# Patient Record
Sex: Female | Born: 1940 | Race: White | Hispanic: No | Smoking: Former smoker
Health system: Southern US, Community
[De-identification: ages and names within clinical notes are randomized; demographics above are authoritative.]

## PROBLEM LIST (undated history)

## (undated) DIAGNOSIS — I10 Essential (primary) hypertension: Secondary | ICD-10-CM

## (undated) DIAGNOSIS — E785 Hyperlipidemia, unspecified: Secondary | ICD-10-CM

## (undated) DIAGNOSIS — Z860100 Personal history of colon polyps, unspecified: Secondary | ICD-10-CM

## (undated) DIAGNOSIS — N189 Chronic kidney disease, unspecified: Secondary | ICD-10-CM

## (undated) DIAGNOSIS — F32A Depression, unspecified: Secondary | ICD-10-CM

## (undated) DIAGNOSIS — F329 Major depressive disorder, single episode, unspecified: Secondary | ICD-10-CM

## (undated) DIAGNOSIS — Z8719 Personal history of other diseases of the digestive system: Secondary | ICD-10-CM

## (undated) DIAGNOSIS — Z87442 Personal history of urinary calculi: Secondary | ICD-10-CM

## (undated) DIAGNOSIS — M810 Age-related osteoporosis without current pathological fracture: Secondary | ICD-10-CM

## (undated) DIAGNOSIS — K219 Gastro-esophageal reflux disease without esophagitis: Secondary | ICD-10-CM

## (undated) DIAGNOSIS — I6529 Occlusion and stenosis of unspecified carotid artery: Secondary | ICD-10-CM

## (undated) DIAGNOSIS — G43909 Migraine, unspecified, not intractable, without status migrainosus: Secondary | ICD-10-CM

## (undated) DIAGNOSIS — Q249 Congenital malformation of heart, unspecified: Secondary | ICD-10-CM

## (undated) HISTORY — DX: Hyperlipidemia, unspecified: E78.5

## (undated) HISTORY — DX: Personal history of other diseases of the digestive system: Z87.19

## (undated) HISTORY — DX: Migraine, unspecified, not intractable, without status migrainosus: G43.909

## (undated) HISTORY — PX: COLONOSCOPY: SHX174

## (undated) HISTORY — DX: Essential (primary) hypertension: I10

## (undated) HISTORY — DX: Congenital malformation of heart, unspecified: Q24.9

## (undated) HISTORY — DX: Personal history of colon polyps, unspecified: Z86.0100

## (undated) HISTORY — DX: Gastro-esophageal reflux disease without esophagitis: K21.9

## (undated) HISTORY — PX: TUBAL LIGATION: SHX77

## (undated) HISTORY — DX: Personal history of urinary calculi: Z87.442

## (undated) HISTORY — PX: EYE SURGERY: SHX253

## (undated) HISTORY — DX: Age-related osteoporosis without current pathological fracture: M81.0

## (undated) HISTORY — DX: Personal history of colonic polyps: Z86.010

## (undated) HISTORY — DX: Occlusion and stenosis of unspecified carotid artery: I65.29

---

## 2011-03-10 DIAGNOSIS — H25019 Cortical age-related cataract, unspecified eye: Secondary | ICD-10-CM | POA: Diagnosis not present

## 2011-03-22 DIAGNOSIS — H40019 Open angle with borderline findings, low risk, unspecified eye: Secondary | ICD-10-CM | POA: Diagnosis not present

## 2011-07-19 ENCOUNTER — Ambulatory Visit: Payer: Self-pay

## 2011-07-19 DIAGNOSIS — S92919A Unspecified fracture of unspecified toe(s), initial encounter for closed fracture: Secondary | ICD-10-CM | POA: Diagnosis not present

## 2011-09-12 DIAGNOSIS — S52599A Other fractures of lower end of unspecified radius, initial encounter for closed fracture: Secondary | ICD-10-CM | POA: Diagnosis not present

## 2011-09-12 DIAGNOSIS — M25549 Pain in joints of unspecified hand: Secondary | ICD-10-CM | POA: Diagnosis not present

## 2011-09-12 DIAGNOSIS — I10 Essential (primary) hypertension: Secondary | ICD-10-CM | POA: Diagnosis not present

## 2011-09-12 DIAGNOSIS — IMO0002 Reserved for concepts with insufficient information to code with codable children: Secondary | ICD-10-CM | POA: Diagnosis not present

## 2011-09-12 DIAGNOSIS — S5290XA Unspecified fracture of unspecified forearm, initial encounter for closed fracture: Secondary | ICD-10-CM | POA: Diagnosis not present

## 2011-09-12 DIAGNOSIS — M25539 Pain in unspecified wrist: Secondary | ICD-10-CM | POA: Diagnosis not present

## 2011-09-13 DIAGNOSIS — S52599A Other fractures of lower end of unspecified radius, initial encounter for closed fracture: Secondary | ICD-10-CM | POA: Diagnosis not present

## 2011-09-13 DIAGNOSIS — M25539 Pain in unspecified wrist: Secondary | ICD-10-CM | POA: Diagnosis not present

## 2011-10-04 DIAGNOSIS — Z0000 Encounter for general adult medical examination without abnormal findings: Secondary | ICD-10-CM | POA: Diagnosis not present

## 2011-10-04 DIAGNOSIS — N951 Menopausal and female climacteric states: Secondary | ICD-10-CM | POA: Diagnosis not present

## 2011-10-04 DIAGNOSIS — M810 Age-related osteoporosis without current pathological fracture: Secondary | ICD-10-CM | POA: Diagnosis not present

## 2011-10-04 DIAGNOSIS — Z1231 Encounter for screening mammogram for malignant neoplasm of breast: Secondary | ICD-10-CM | POA: Diagnosis not present

## 2011-10-04 DIAGNOSIS — S52599A Other fractures of lower end of unspecified radius, initial encounter for closed fracture: Secondary | ICD-10-CM | POA: Diagnosis not present

## 2011-10-25 DIAGNOSIS — S52599A Other fractures of lower end of unspecified radius, initial encounter for closed fracture: Secondary | ICD-10-CM | POA: Diagnosis not present

## 2011-10-25 DIAGNOSIS — M25539 Pain in unspecified wrist: Secondary | ICD-10-CM | POA: Diagnosis not present

## 2011-11-01 DIAGNOSIS — E785 Hyperlipidemia, unspecified: Secondary | ICD-10-CM | POA: Diagnosis not present

## 2011-11-15 DIAGNOSIS — S52599A Other fractures of lower end of unspecified radius, initial encounter for closed fracture: Secondary | ICD-10-CM | POA: Diagnosis not present

## 2011-11-15 DIAGNOSIS — M25539 Pain in unspecified wrist: Secondary | ICD-10-CM | POA: Diagnosis not present

## 2011-11-17 DIAGNOSIS — Z780 Asymptomatic menopausal state: Secondary | ICD-10-CM | POA: Diagnosis not present

## 2011-11-17 DIAGNOSIS — R928 Other abnormal and inconclusive findings on diagnostic imaging of breast: Secondary | ICD-10-CM | POA: Diagnosis not present

## 2011-11-17 DIAGNOSIS — Z1231 Encounter for screening mammogram for malignant neoplasm of breast: Secondary | ICD-10-CM | POA: Diagnosis not present

## 2011-11-17 DIAGNOSIS — M899 Disorder of bone, unspecified: Secondary | ICD-10-CM | POA: Diagnosis not present

## 2012-01-08 DIAGNOSIS — Z23 Encounter for immunization: Secondary | ICD-10-CM | POA: Diagnosis not present

## 2012-01-19 HISTORY — PX: CATARACT EXTRACTION: SUR2

## 2012-02-28 DIAGNOSIS — H40019 Open angle with borderline findings, low risk, unspecified eye: Secondary | ICD-10-CM | POA: Diagnosis not present

## 2012-03-06 DIAGNOSIS — H2510 Age-related nuclear cataract, unspecified eye: Secondary | ICD-10-CM | POA: Diagnosis not present

## 2012-04-25 DIAGNOSIS — I10 Essential (primary) hypertension: Secondary | ICD-10-CM | POA: Diagnosis not present

## 2012-04-25 DIAGNOSIS — Z01818 Encounter for other preprocedural examination: Secondary | ICD-10-CM | POA: Diagnosis not present

## 2012-04-25 DIAGNOSIS — H269 Unspecified cataract: Secondary | ICD-10-CM | POA: Diagnosis not present

## 2012-05-09 DIAGNOSIS — I499 Cardiac arrhythmia, unspecified: Secondary | ICD-10-CM | POA: Diagnosis not present

## 2012-05-09 DIAGNOSIS — F329 Major depressive disorder, single episode, unspecified: Secondary | ICD-10-CM | POA: Diagnosis not present

## 2012-05-09 DIAGNOSIS — IMO0002 Reserved for concepts with insufficient information to code with codable children: Secondary | ICD-10-CM | POA: Diagnosis not present

## 2012-05-09 DIAGNOSIS — M810 Age-related osteoporosis without current pathological fracture: Secondary | ICD-10-CM | POA: Diagnosis not present

## 2012-05-09 DIAGNOSIS — H2510 Age-related nuclear cataract, unspecified eye: Secondary | ICD-10-CM | POA: Diagnosis not present

## 2012-05-09 DIAGNOSIS — I10 Essential (primary) hypertension: Secondary | ICD-10-CM | POA: Diagnosis not present

## 2012-05-10 DIAGNOSIS — Z961 Presence of intraocular lens: Secondary | ICD-10-CM | POA: Diagnosis not present

## 2012-05-15 DIAGNOSIS — Z961 Presence of intraocular lens: Secondary | ICD-10-CM | POA: Diagnosis not present

## 2012-09-03 DIAGNOSIS — X500XXA Overexertion from strenuous movement or load, initial encounter: Secondary | ICD-10-CM | POA: Diagnosis not present

## 2012-09-03 DIAGNOSIS — S8263XA Displaced fracture of lateral malleolus of unspecified fibula, initial encounter for closed fracture: Secondary | ICD-10-CM | POA: Diagnosis not present

## 2012-09-03 DIAGNOSIS — S82899A Other fracture of unspecified lower leg, initial encounter for closed fracture: Secondary | ICD-10-CM | POA: Diagnosis not present

## 2012-09-04 DIAGNOSIS — S93409A Sprain of unspecified ligament of unspecified ankle, initial encounter: Secondary | ICD-10-CM | POA: Diagnosis not present

## 2012-09-20 DIAGNOSIS — S93409A Sprain of unspecified ligament of unspecified ankle, initial encounter: Secondary | ICD-10-CM | POA: Diagnosis not present

## 2012-11-07 DIAGNOSIS — Z23 Encounter for immunization: Secondary | ICD-10-CM | POA: Diagnosis not present

## 2012-11-20 DIAGNOSIS — I10 Essential (primary) hypertension: Secondary | ICD-10-CM | POA: Diagnosis not present

## 2013-03-21 ENCOUNTER — Encounter (INDEPENDENT_AMBULATORY_CARE_PROVIDER_SITE_OTHER): Payer: Self-pay

## 2013-03-21 ENCOUNTER — Encounter: Payer: Self-pay | Admitting: Adult Health

## 2013-03-21 ENCOUNTER — Ambulatory Visit (INDEPENDENT_AMBULATORY_CARE_PROVIDER_SITE_OTHER): Payer: Medicare Other | Admitting: Adult Health

## 2013-03-21 VITALS — BP 138/66 | HR 60 | Temp 98.2°F | Resp 14 | Ht 60.5 in | Wt 160.0 lb

## 2013-03-21 DIAGNOSIS — Z79899 Other long term (current) drug therapy: Secondary | ICD-10-CM | POA: Diagnosis not present

## 2013-03-21 LAB — POTASSIUM: POTASSIUM: 4.3 meq/L (ref 3.5–5.1)

## 2013-03-21 MED ORDER — CITALOPRAM HYDROBROMIDE 20 MG PO TABS: 20.0000 mg | ORAL_TABLET | Freq: Every day | ORAL | Status: DC

## 2013-03-21 MED ORDER — TRIAMTERENE-HCTZ 37.5-25 MG PO TABS: 1.0000 | ORAL_TABLET | Freq: Every day | ORAL | Status: DC

## 2013-03-21 MED ORDER — GEMFIBROZIL 600 MG PO TABS: 600.0000 mg | ORAL_TABLET | Freq: Two times a day (BID) | ORAL | Status: DC

## 2013-03-21 MED ORDER — METOPROLOL SUCCINATE ER 25 MG PO TB24: 25.0000 mg | ORAL_TABLET | Freq: Every day | ORAL | Status: DC

## 2013-03-21 NOTE — Progress Notes (Signed)
Patient ID: Christie Stevenson, female   DOB: [birth year], 73 y.o.   MRN: [id number]    Subjective:    Patient ID: Christie Stevenson, female    DOB: 1940-07-01, 73 y.o.   MRN: [id number]  HPI  Pt is a pleasant 73 y/o female who presents to clinic to establish care. Recently moved to the area from [place]. She is feeling well overall. Will request records from previous PCP. Pt reports that she will need refills on her medications.    Past Medical History  Diagnosis Date  . History of diverticulitis   . GERD (gastroesophageal reflux disease)   . 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ease   . Hyperlipidemia   . Hypertension   . Migraine   . History of kidney stones   . History of colon polyps   . Osteoporosis      Past Surgical History  Procedure Laterality Date  . Cataract extraction Left 2014     Family History  Problem Relation Age of Onset  . Arthritis Mother   . Hypertension Mother   . Osteoporosis Mother   . Osteoporosis Sister   . Osteoporosis Brother   . Diabetes Daughter   . Diabetes Son   . Cancer Paternal Uncle     Prostate Cancer  . Cancer Sister     Breast Cancer   . Osteoporosis Sister   . Osteoporosis Brother   . Diabetes Daughter      History   Social History  . Marital Status: Single    Spouse Name: N/A    Number of Children: 5  . Years of Education: 12   Occupational History  . Accounting work     Retired   Social History Main Topics  . Smoking status: Former Research scientist (life sciences)  . Smokeless tobacco: Not on file  . Alcohol Use: Yes     Comment: occasional drink  . Drug Use: No  . Sexual Activity: Not on file   Other Topics Concern  . Not on file   Social History Narrative   Caffeine - 2 cups of coffee   Exercise - none regularly   Diet - tries to eat as healthy as possible but can do better      Does not have a Living Will           Review of Systems  Constitutional: Negative.   HENT: Negative.   Eyes: Negative.   Respiratory:  Negative.   Cardiovascular: Negative.   Gastrointestinal: Negative.   Endocrine: Negative.   Genitourinary: Negative.   Musculoskeletal: Negative.   Skin: Negative.   Allergic/Immunologic: Negative.   Neurological: Negative.   Hematological: Negative.   Psychiatric/Behavioral: Negative.        Objective:  BP 138/66  Pulse 60  Temp(Src) 98.2 F (36.8 C) (Oral)  Ht 5' 0.5" (1.537 m)  Wt 160 lb (72.576 kg)  BMI 30.72 kg/m2  SpO2 94%   Physical Exam  Constitutional: She is oriented to person, place, and time. She appears well-developed and well-nourished. No distress.  HENT:  Head: Normocephalic and atraumatic.  Right Ear: External ear normal.  Left Ear: External ear normal.  Nose: Nose normal.  Mouth/Throat: Oropharynx is clear and moist.  Eyes: Conjunctivae and EOM are normal. Pupils are equal, round, and reactive to light.  Neck: Normal range of motion. Neck supple. No tracheal deviation present. No thyromegaly present.  Cardiovascular: Normal rate, regular rhythm, normal heart sounds and intact distal pulses.  Exam reveals no gallop and no  friction rub.   No murmur heard. Pulmonary/Chest: Effort normal and breath sounds normal. No respiratory distress. She has no wheezes. She has no rales.  Abdominal: Soft. Bowel sounds are normal. She exhibits no distension and no mass. There is no tenderness. There is no rebound and no guarding.  Musculoskeletal: Normal range of motion. She exhibits no edema and no tenderness.  Lymphadenopathy:    She has no cervical adenopathy.  Neurological: She is alert and oriented to person, place, and time. She has normal reflexes. No cranial nerve deficit. Coordination normal.  Skin: Skin is warm and dry.  Psychiatric: She has a normal mood and affect. Her behavior is normal. Judgment and thought content normal.       Assessment & Plan:   1. Medication management Medications reviewed with patient. I will check her potassium given meds that  are potassium sparing and she takes a multi-vitamin containing potassium. Refills provided  - Potassium

## 2013-03-21 NOTE — Progress Notes (Signed)
Pre visit review using our clinic review tool, if applicable. No additional management support is needed unless otherwise documented below in the visit note. 

## 2013-03-21 NOTE — Patient Instructions (Signed)
   Thank you for choosing [facility] for your health care needs.  Please have your labs drawn prior to leaving the office. This is to check your potassium.  I will notify you of the results once they are available.  Please remember to schedule your yearly Medicare Wellness exam.

## 2013-03-22 DIAGNOSIS — H2510 Age-related nuclear cataract, unspecified eye: Secondary | ICD-10-CM | POA: Diagnosis not present

## 2013-03-23 ENCOUNTER — Encounter: Payer: Self-pay | Admitting: *Deleted

## 2013-04-05 DIAGNOSIS — H40059 Ocular hypertension, unspecified eye: Secondary | ICD-10-CM | POA: Diagnosis not present

## 2013-04-19 ENCOUNTER — Other Ambulatory Visit: Payer: Self-pay | Admitting: *Deleted

## 2013-10-19 DIAGNOSIS — Z23 Encounter for immunization: Secondary | ICD-10-CM | POA: Diagnosis not present

## 2013-10-22 DIAGNOSIS — H40051 Ocular hypertension, right eye: Secondary | ICD-10-CM | POA: Diagnosis not present

## 2013-10-30 ENCOUNTER — Telehealth: Payer: Self-pay | Admitting: Internal Medicine

## 2013-10-30 NOTE — Telephone Encounter (Signed)
Pt had [facility] Screening done. She was told that she had to be seen by a doctor to review the results. Pt dropped off health screening forms to be reviewed.msn

## 2013-10-31 NOTE — Telephone Encounter (Signed)
Pt has an appt scheduled for Friday oct 16th, will give Dr Gilford Rile form to review with pt at that time

## 2013-11-02 ENCOUNTER — Ambulatory Visit (INDEPENDENT_AMBULATORY_CARE_PROVIDER_SITE_OTHER): Payer: Medicare Other | Admitting: Internal Medicine

## 2013-11-02 ENCOUNTER — Encounter: Payer: Self-pay | Admitting: Internal Medicine

## 2013-11-02 VITALS — BP 136/72 | HR 58 | Resp 14 | Ht 60.5 in | Wt 157.5 lb

## 2013-11-02 DIAGNOSIS — I6529 Occlusion and stenosis of unspecified carotid artery: Secondary | ICD-10-CM | POA: Insufficient documentation

## 2013-11-02 DIAGNOSIS — Z1239 Encounter for other screening for malignant neoplasm of breast: Secondary | ICD-10-CM

## 2013-11-02 DIAGNOSIS — I6521 Occlusion and stenosis of right carotid artery: Secondary | ICD-10-CM

## 2013-11-02 DIAGNOSIS — I10 Essential (primary) hypertension: Secondary | ICD-10-CM | POA: Diagnosis not present

## 2013-11-02 DIAGNOSIS — R5383 Other fatigue: Secondary | ICD-10-CM

## 2013-11-02 LAB — LIPID PANEL
Cholesterol: 261 mg/dL — ABNORMAL HIGH (ref 0–200)
HDL: 42.2 mg/dL (ref >39.00)
LDL Cholesterol: 186 mg/dL — ABNORMAL HIGH (ref 0–99)
NONHDL: 218.8
Total CHOL/HDL Ratio: 6
Triglycerides: 164 mg/dL — ABNORMAL HIGH (ref 0.0–149.0)
VLDL: 32.8 mg/dL (ref 0.0–40.0)

## 2013-11-02 LAB — CBC WITH DIFFERENTIAL/PLATELET
BASOS ABS: 0 10*3/uL (ref 0.0–0.1)
Basophils Relative: 0.5 % (ref 0.0–3.0)
EOS ABS: 0.2 10*3/uL (ref 0.0–0.7)
Eosinophils Relative: 2.7 % (ref 0.0–5.0)
HCT: 42.1 % (ref 36.0–46.0)
Hemoglobin: 13.7 g/dL (ref 12.0–15.0)
LYMPHS PCT: 20.9 % (ref 12.0–46.0)
Lymphs Abs: 1.8 10*3/uL (ref 0.7–4.0)
MCHC: 32.6 g/dL (ref 30.0–36.0)
MCV: 85.6 fl (ref 78.0–100.0)
MONO ABS: 0.7 10*3/uL (ref 0.1–1.0)
Monocytes Relative: 8 % (ref 3.0–12.0)
NEUTROS PCT: 67.9 % (ref 43.0–77.0)
Neutro Abs: 5.8 10*3/uL (ref 1.4–7.7)
Platelets: 313 10*3/uL (ref 150.0–400.0)
RBC: 4.92 Mil/uL (ref 3.87–5.11)
RDW: 14.2 % (ref 11.5–15.5)
WBC: 8.6 10*3/uL (ref 4.0–10.5)

## 2013-11-02 LAB — COMPREHENSIVE METABOLIC PANEL
ALBUMIN: 3.5 g/dL (ref 3.5–5.2)
ALT: 15 U/L (ref 0–35)
AST: 19 U/L (ref 0–37)
Alkaline Phosphatase: 90 U/L (ref 39–117)
BUN: 18 mg/dL (ref 6–23)
CO2: 29 mEq/L (ref 19–32)
Calcium: 10.3 mg/dL (ref 8.4–10.5)
Chloride: 96 mEq/L (ref 96–112)
Creatinine, Ser: 1.1 mg/dL (ref 0.4–1.2)
GFR: 54.12 mL/min — ABNORMAL LOW (ref >60.00)
Glucose, Bld: 81 mg/dL (ref 70–99)
POTASSIUM: 4.1 meq/L (ref 3.5–5.1)
Sodium: 133 mEq/L — ABNORMAL LOW (ref 135–145)
Total Bilirubin: 0.8 mg/dL (ref 0.2–1.2)
Total Protein: 7.5 g/dL (ref 6.0–8.3)

## 2013-11-02 LAB — TSH: TSH: 1.69 u[IU]/mL (ref 0.35–4.50)

## 2013-11-02 LAB — VITAMIN D 25 HYDROXY (VIT D DEFICIENCY, FRACTURES): VITD: 25.97 ng/mL — AB (ref 30.00–100.00)

## 2013-11-02 LAB — VITAMIN B12: VITAMIN B 12: 636 pg/mL (ref 211–911)

## 2013-11-02 NOTE — Patient Instructions (Signed)
Labs today.  We will set up a carotid ultrasound.  Follow up here in 4 weeks.

## 2013-11-02 NOTE — Assessment & Plan Note (Signed)
BP Readings from Last 3 Encounters:  11/02/13 136/72  03/21/13 138/66   BP well controlled on current medication. Will check renal function with labs.

## 2013-11-02 NOTE — Assessment & Plan Note (Signed)
Several months of increasing generalized fatigue. Will check CBC< CMP, lipids, TSH, B12, Vit D with labs.

## 2013-11-02 NOTE — Assessment & Plan Note (Signed)
Recent [facility] screening showed stenosis in right carotid artery. Will repeat full carotid dopplers. Check lipids with labs today.

## 2013-11-02 NOTE — Progress Notes (Signed)
Subjective:    Patient ID: Christie Stevenson, female    DOB: [birth year], 73 y.o.   MRN: [id number]  HPI 73YO female presents to follow up recent [facility] Screening. Patient of Raquel Rey's.  [facility] Screen suggested carotid artery disease on the right. Currently asymptomatic except for mild fatigue. Occasional lightheadedness when lying down for sleep. Former smoker, quit in 1997. Had previous cardiac work up which was [place] No chest pain, dyspnea, focal neurologic symptoms. No change in bowel habits. Appetite good.    Review of Systems  Constitutional: Positive for fatigue. Negative for fever, chills, appetite change and unexpected weight change.  Eyes: Negative for visual disturbance.  Respiratory: Negative for shortness of breath.   Cardiovascular: Negative for chest pain, palpitations and leg swelling.  Gastrointestinal: Negative for nausea, vomiting, abdominal pain, diarrhea, constipation and blood in stool.  Skin: Negative for color change and rash.  Hematological: Negative for adenopathy. Does not bruise/bleed easily.  Psychiatric/Behavioral: Negative for sleep disturbance and dysphoric mood. The patient is not nervous/anxious.        Objective:    BP 136/72  Pulse 58  Resp 14  Ht 5' 0.5" (1.537 m)  Wt 157 lb 8 oz (71.442 kg)  BMI 30.24 kg/m2  SpO2 97% Physical Exam  Constitutional: She is oriented to person, place, and time. She appears well-developed and well-nourished. No distress.  HENT:  Head: Normocephalic and atraumatic.  Right Ear: External ear normal.  Left Ear: External ear normal.  Nose: Nose normal.  Mouth/Throat: Oropharynx is clear and moist. No oropharyngeal exudate.  Eyes: Conjunctivae are normal. Pupils are equal, round, and reactive to light. Right eye exhibits no discharge. Left eye exhibits no discharge. No scleral icterus.  Neck: Normal range of motion. Neck supple. Normal carotid pulses present. Carotid  bruit is not present. No tracheal deviation present. No mass and no thyromegaly present.  Cardiovascular: Normal rate, regular rhythm, normal heart sounds and intact distal pulses.  Exam reveals no gallop and no friction rub.   No murmur heard. Pulmonary/Chest: Effort normal and breath sounds normal. No accessory muscle usage or stridor. Not tachypneic. No respiratory distress. She has no decreased breath sounds. She has no wheezes. She has no rhonchi. She has no rales. She exhibits no tenderness.  Musculoskeletal: Normal range of motion. She exhibits no edema and no tenderness.  Lymphadenopathy:    She has no cervical adenopathy.  Neurological: She is alert and oriented to person, place, and time. No cranial nerve deficit. She exhibits normal muscle tone. Coordination normal.  Skin: Skin is warm and dry. No rash noted. She is not diaphoretic. No erythema. No pallor.  Psychiatric: She has a normal mood and affect. Her behavior is normal. Judgment and thought content normal.          Assessment & Plan:   Problem List Items Addressed This Visit     Unprioritized   Carotid stenosis - Primary     Recent [facility] screening showed stenosis in right carotid artery. Will repeat full carotid dopplers. Check lipids with labs today.    Relevant Orders      Lipid Profile      Ambulatory referral to Vascular Surgery   Essential hypertension      BP Readings from Last 3 Encounters:  11/02/13 136/72  03/21/13 138/66   BP well controlled on current medication. Will check renal function with labs.    Relevant Orders      EKG 12-Lead  Other fatigue     Several months of increasing generalized fatigue. Will check CBC< CMP, lipids, TSH, B12, Vit D with labs.    Relevant Orders      Comprehensive metabolic panel      CBC w/Diff      TSH      B12      Vit D  25 hydroxy (rtn osteoporosis monitoring)   Screening for breast cancer   Relevant Orders      MM Digital Screening       Return  in about 4 weeks (around 11/30/2013) for Recheck.

## 2013-11-02 NOTE — Progress Notes (Signed)
Pre visit review using our clinic review tool, if applicable. No additional management support is needed unless otherwise documented below in the visit note. 

## 2013-11-05 ENCOUNTER — Telehealth: Payer: Self-pay | Admitting: Adult Health

## 2013-11-05 ENCOUNTER — Other Ambulatory Visit: Payer: Self-pay | Admitting: *Deleted

## 2013-11-05 MED ORDER — ATORVASTATIN CALCIUM 20 MG PO TABS: 20.0000 mg | ORAL_TABLET | Freq: Every day | ORAL | Status: DC

## 2013-11-05 NOTE — Telephone Encounter (Signed)
emmi emailed °

## 2013-11-16 ENCOUNTER — Other Ambulatory Visit: Payer: Self-pay | Admitting: *Deleted

## 2013-11-16 MED ORDER — TRIAMTERENE-HCTZ 37.5-25 MG PO TABS: 1.0000 | ORAL_TABLET | Freq: Every day | ORAL | Status: DC

## 2013-11-16 MED ORDER — CITALOPRAM HYDROBROMIDE 20 MG PO TABS: 20.0000 mg | ORAL_TABLET | Freq: Every day | ORAL | Status: DC

## 2013-11-26 DIAGNOSIS — I499 Cardiac arrhythmia, unspecified: Secondary | ICD-10-CM | POA: Diagnosis not present

## 2013-11-26 DIAGNOSIS — I8390 Asymptomatic varicose veins of unspecified lower extremity: Secondary | ICD-10-CM | POA: Diagnosis not present

## 2013-11-26 DIAGNOSIS — E785 Hyperlipidemia, unspecified: Secondary | ICD-10-CM | POA: Diagnosis not present

## 2013-11-26 DIAGNOSIS — I6529 Occlusion and stenosis of unspecified carotid artery: Secondary | ICD-10-CM | POA: Diagnosis not present

## 2013-12-04 ENCOUNTER — Ambulatory Visit (INDEPENDENT_AMBULATORY_CARE_PROVIDER_SITE_OTHER): Payer: Medicare Other | Admitting: Internal Medicine

## 2013-12-04 ENCOUNTER — Encounter: Payer: Self-pay | Admitting: Internal Medicine

## 2013-12-04 VITALS — BP 118/71 | HR 58 | Temp 97.8°F | Ht 60.5 in | Wt 154.8 lb

## 2013-12-04 DIAGNOSIS — I6521 Occlusion and stenosis of right carotid artery: Secondary | ICD-10-CM | POA: Diagnosis not present

## 2013-12-04 DIAGNOSIS — E785 Hyperlipidemia, unspecified: Secondary | ICD-10-CM | POA: Diagnosis not present

## 2013-12-04 LAB — LIPID PANEL
CHOL/HDL RATIO: 4
Cholesterol: 177 mg/dL (ref 0–200)
HDL: 49.3 mg/dL (ref >39.00)
LDL Cholesterol: 110 mg/dL — ABNORMAL HIGH (ref 0–99)
NONHDL: 127.7
Triglycerides: 89 mg/dL (ref 0.0–149.0)
VLDL: 17.8 mg/dL (ref 0.0–40.0)

## 2013-12-04 LAB — COMPREHENSIVE METABOLIC PANEL
ALT: 17 U/L (ref 0–35)
AST: 22 U/L (ref 0–37)
Albumin: 4 g/dL (ref 3.5–5.2)
Alkaline Phosphatase: 79 U/L (ref 39–117)
BUN: 23 mg/dL (ref 6–23)
CHLORIDE: 101 meq/L (ref 96–112)
CO2: 27 mEq/L (ref 19–32)
Calcium: 10.1 mg/dL (ref 8.4–10.5)
Creatinine, Ser: 1.1 mg/dL (ref 0.4–1.2)
GFR: 49.75 mL/min — ABNORMAL LOW (ref >60.00)
Glucose, Bld: 81 mg/dL (ref 70–99)
POTASSIUM: 4.1 meq/L (ref 3.5–5.1)
Sodium: 134 mEq/L — ABNORMAL LOW (ref 135–145)
TOTAL PROTEIN: 7.1 g/dL (ref 6.0–8.3)
Total Bilirubin: 0.6 mg/dL (ref 0.2–1.2)

## 2013-12-04 NOTE — Patient Instructions (Signed)
Labs today.  Follow up in 4 weeks. 

## 2013-12-04 NOTE — Progress Notes (Signed)
Pre visit review using our clinic review tool, if applicable. No additional management support is needed unless otherwise documented below in the visit note. 

## 2013-12-04 NOTE — Assessment & Plan Note (Signed)
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 Pt was started on Atorvastatin 20mg  daily. Will repeat CMP and lipids today.

## 2013-12-04 NOTE — Progress Notes (Signed)
   Subjective:    Patient ID: Christie Stevenson, female    DOB: 1941/01/02, 73 y.o.   MRN: [id number]  HPI 73YO female presents for follow up.  Hyperlipidemia - Noted to have LDL 186 on recent labs. Started on Atorvastatin. Trying to follow healthy diet and exercise by gardening. No side effects noted from medication.  Carotid stenosis - Scheduled for carotid dopplers in December.    Review of Systems  Constitutional: Negative for fever, chills, appetite change, fatigue and unexpected weight change.  Eyes: Negative for visual disturbance.  Respiratory: Negative for shortness of breath.   Cardiovascular: Negative for chest pain and leg swelling.  Gastrointestinal: Negative for abdominal pain.  Musculoskeletal: Negative for myalgias and arthralgias.  Skin: Negative for color change and rash.  Hematological: Negative for adenopathy. Does not bruise/bleed easily.  Psychiatric/Behavioral: Negative for dysphoric mood. The patient is not nervous/anxious.        Objective:    BP 118/71 mmHg  Pulse 58  Temp(Src) 97.8 F (36.6 C) (Oral)  Ht 5' 0.5" (1.537 m)  Wt 154 lb 12 oz (70.194 kg)  BMI 29.71 kg/m2  SpO2 96% Physical Exam  Constitutional: She is oriented to person, place, and time. She appears well-developed and well-nourished. No distress.  HENT:  Head: Normocephalic and atraumatic.  Right Ear: External ear normal.  Left Ear: External ear normal.  Nose: Nose normal.  Mouth/Throat: Oropharynx is clear and moist. No oropharyngeal exudate.  Eyes: Conjunctivae are normal. Pupils are equal, round, and reactive to light. Right eye exhibits no discharge. Left eye exhibits no discharge. No scleral icterus.  Neck: Normal range of motion. Neck supple. No tracheal deviation present. No thyromegaly present.  Cardiovascular: Normal rate, regular rhythm, normal heart sounds and intact distal pulses.  Exam reveals no gallop and no friction rub.   No murmur heard. Pulmonary/Chest: Effort normal  and breath sounds normal. No accessory muscle usage. No tachypnea. No respiratory distress. She has no decreased breath sounds. She has no wheezes. She has no rhonchi. She has no rales. She exhibits no tenderness.  Musculoskeletal: Normal range of motion. She exhibits no edema or tenderness.  Lymphadenopathy:    She has no cervical adenopathy.  Neurological: She is alert and oriented to person, place, and time. No cranial nerve deficit. She exhibits normal muscle tone. Coordination normal.  Skin: Skin is warm and dry. No rash noted. She is not diaphoretic. No erythema. No pallor.  Psychiatric: She has a normal mood and affect. Her behavior is normal. Judgment and thought content normal.          Assessment & Plan:   Problem List Items Addressed This Visit      Unprioritized   Carotid stenosis    Follow up carotid doppler is pending.    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 Pt was started on Atorvastatin 20mg  daily. Will repeat CMP and lipids today.    Relevant Orders      Comprehensive metabolic panel      Lipid Profile       Return in about 4 weeks (around 01/01/2014) for Recheck.

## 2013-12-04 NOTE — Assessment & Plan Note (Signed)
Follow up carotid doppler is pending.

## 2013-12-19 DIAGNOSIS — I6529 Occlusion and stenosis of unspecified carotid artery: Secondary | ICD-10-CM | POA: Diagnosis not present

## 2013-12-25 ENCOUNTER — Other Ambulatory Visit: Payer: Self-pay | Admitting: *Deleted

## 2013-12-25 MED ORDER — METOPROLOL SUCCINATE ER 25 MG PO TB24: 25.0000 mg | ORAL_TABLET | Freq: Every day | ORAL | Status: DC

## 2013-12-27 DIAGNOSIS — I6529 Occlusion and stenosis of unspecified carotid artery: Secondary | ICD-10-CM | POA: Diagnosis not present

## 2013-12-27 DIAGNOSIS — E785 Hyperlipidemia, unspecified: Secondary | ICD-10-CM | POA: Diagnosis not present

## 2013-12-27 DIAGNOSIS — I10 Essential (primary) hypertension: Secondary | ICD-10-CM | POA: Diagnosis not present

## 2014-01-01 ENCOUNTER — Ambulatory Visit: Payer: PRIVATE HEALTH INSURANCE | Admitting: Nurse Practitioner

## 2014-01-03 ENCOUNTER — Encounter: Payer: Self-pay | Admitting: Nurse Practitioner

## 2014-01-03 ENCOUNTER — Ambulatory Visit (INDEPENDENT_AMBULATORY_CARE_PROVIDER_SITE_OTHER): Payer: Medicare Other | Admitting: Nurse Practitioner

## 2014-01-03 VITALS — BP 120/68 | HR 78 | Temp 97.9°F | Resp 16 | Ht 60.5 in | Wt 158.2 lb

## 2014-01-03 DIAGNOSIS — N183 Chronic kidney disease, stage 3 unspecified: Secondary | ICD-10-CM

## 2014-01-03 DIAGNOSIS — E785 Hyperlipidemia, unspecified: Secondary | ICD-10-CM

## 2014-01-03 DIAGNOSIS — I10 Essential (primary) hypertension: Secondary | ICD-10-CM

## 2014-01-03 DIAGNOSIS — I6521 Occlusion and stenosis of right carotid artery: Secondary | ICD-10-CM

## 2014-01-03 DIAGNOSIS — E871 Hypo-osmolality and hyponatremia: Secondary | ICD-10-CM

## 2014-01-03 NOTE — Progress Notes (Signed)
Subjective:    Patient ID: Christie Stevenson, female    DOB: 24-Sep-1940, 73 y.o.   MRN: [id number]  HPI  [name] is a 73 yo female with here for a 4 week follow up.   1) Last lipid panel was 12/04/13 results below on 20 mg of Atorvastatin.   Lab Results  Component Value Date   CHOL 177 12/04/2013   HDL 49.30 12/04/2013   LDLCALC 110* 12/04/2013   TRIG 89.0 12/04/2013   CHOLHDL 4 12/04/2013   Fine so far, no issues with medication, compliance is excellent.  Carotid Dopplars done beginning of Dec.   Blockage on Left 50% , Right minimal   Repeat US in 6 months We discussed prior labs including CMET that showed worsening kidney function and low sodium. Pt states she does not drink much fluids, maybe a pepsi at lunch.   Patient has no other complaints today nor need for med refills .  Review of Systems  Constitutional: Negative for fever, chills, diaphoresis, fatigue and unexpected weight change.  HENT: Negative for trouble swallowing.   Eyes: Negative for visual disturbance.  Respiratory: Negative for chest tightness, shortness of breath and wheezing.   Cardiovascular: Negative for chest pain, palpitations and leg swelling.  Gastrointestinal: Negative for nausea, vomiting, abdominal pain, diarrhea and constipation.  Genitourinary: Negative for dysuria.  Musculoskeletal: Negative for myalgias.  Skin: Negative for rash.  Neurological: Negative for dizziness, weakness and headaches.  Psychiatric/Behavioral: The patient is not nervous/anxious.    Past Medical History  Diagnosis Date  . History of diverticulitis   . GERD (gastroesophageal reflux disease)   . 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ease   . Hyperlipidemia   . Hypertension   . Migraine   . History of kidney stones   . History of colon polyps   . Osteoporosis     History   Social History  . Marital Status: Single    Spouse Name: N/A    Number of Children: 5  . Years of Education: 12    Occupational History  . Accounting work     Retired   Social History Main Topics  . Smoking status: Former Research scientist (life sciences)  . Smokeless tobacco: Not on file  . Alcohol Use: Yes     Comment: occasional drink  . Drug Use: No  . Sexual Activity: Not on file   Other Topics Concern  . Not on file   Social History Narrative   Caffeine - 2 cups of coffee   Exercise - none regularly   Diet - tries to eat as healthy as possible but can do better      Does not have a Living Will          Past Surgical History  Procedure Laterality Date  . Cataract extraction Left 2014    Family History  Problem Relation Age of Onset  . Arthritis Mother   . Hypertension Mother   . Osteoporosis Mother   . Osteoporosis Sister   . Osteoporosis Brother   . Diabetes Daughter   . Diabetes Son   . Cancer Paternal Uncle     Prostate Cancer  . Cancer Sister     Breast Cancer   . Osteoporosis Sister   . Osteoporosis Brother   . Diabetes Daughter     No Known Allergies  Current Outpatient Prescriptions on File Prior to Visit  Medication Sig Dispense Refill  . amLODipine (NORVASC) 5 MG tablet Take 5 mg by mouth daily.    Marland Kitchen  atorvastatin (LIPITOR) 20 MG tablet Take 1 tablet (20 mg total) by mouth daily. 30 tablet 3  . Calcium Carb-Cholecalciferol (CALCIUM 600 + D PO) Take 600 mg by mouth 2 (two) times daily.    . citalopram (CELEXA) 20 MG tablet Take 1 tablet (20 mg total) by mouth daily. 30 tablet 2  . gemfibrozil (LOPID) 600 MG tablet Take 1 tablet (600 mg total) by mouth 2 (two) times daily before a meal. 60 tablet 6  . latanoprost (XALATAN) 0.005 % ophthalmic solution Place 1 drop into the right eye at bedtime.    . metoprolol succinate (TOPROL-XL) 25 MG 24 hr tablet Take 1 tablet (25 mg total) by mouth daily. 30 tablet 6  . Multiple Vitamin (MULTIVITAMIN WITH MINERALS) TABS tablet Take 1 tablet by mouth daily.    Marland Kitchen triamterene-hydrochlorothiazide (MAXZIDE-25) 37.5-25 MG per tablet Take 1 tablet by  mouth daily. 30 tablet 5   No current facility-administered medications on file prior to visit.       Objective:   Physical Exam  Constitutional: She is oriented to person, place, and time. She appears well-developed and well-nourished. No distress.  HENT:  Head: Normocephalic and atraumatic.  Cardiovascular: Normal rate and regular rhythm.   Pulmonary/Chest: Effort normal and breath sounds normal.  Musculoskeletal: Normal range of motion.  Neurological: She is alert and oriented to person, place, and time.  Skin: Skin is warm and dry. No rash noted. She is not diaphoretic.  Psychiatric: She has a normal mood and affect. Her behavior is normal. Judgment and thought content normal.      BP 120/68 mmHg  Pulse 78  Temp(Src) 97.9 F (36.6 C) (Oral)  Resp 16  Ht 5' 0.5" (1.537 m)  Wt 158 lb 3.2 oz (71.759 kg)  BMI 30.38 kg/m2  SpO2 98%     Assessment & Plan:

## 2014-01-03 NOTE — Patient Instructions (Addendum)
Please stay hydrated with water and non-caffienated beverages.   Please make a fasting lab visit before leaving today.

## 2014-01-03 NOTE — Assessment & Plan Note (Signed)
BP Readings from Last 3 Encounters:  01/03/14 120/68  12/04/13 118/71  11/02/13 136/72   BP well controlled on meds. Renal function on CMET is declining and pt has hyponatremia without symptoms (last two visits). Fasting labs to recheck a CMET. FU in 1 month

## 2014-01-03 NOTE — Progress Notes (Signed)
Pre visit review using our clinic review tool, if applicable. No additional management support is needed unless otherwise documented below in the visit note. 

## 2014-01-03 NOTE — Assessment & Plan Note (Signed)
Discussed drinking more fluids (non-caffeinated) through out the day. Will recheck lipids in Feb. FU in 1 month

## 2014-02-07 ENCOUNTER — Ambulatory Visit (INDEPENDENT_AMBULATORY_CARE_PROVIDER_SITE_OTHER): Payer: Medicare Other | Admitting: Nurse Practitioner

## 2014-02-07 ENCOUNTER — Encounter: Payer: Self-pay | Admitting: Nurse Practitioner

## 2014-02-07 VITALS — BP 130/70 | HR 63 | Resp 14 | Ht 60.5 in | Wt 159.5 lb

## 2014-02-07 DIAGNOSIS — E871 Hypo-osmolality and hyponatremia: Secondary | ICD-10-CM | POA: Diagnosis not present

## 2014-02-07 DIAGNOSIS — I10 Essential (primary) hypertension: Secondary | ICD-10-CM

## 2014-02-07 DIAGNOSIS — N183 Chronic kidney disease, stage 3 unspecified: Secondary | ICD-10-CM

## 2014-02-07 LAB — COMPREHENSIVE METABOLIC PANEL
ALT: 14 U/L (ref 0–35)
AST: 16 U/L (ref 0–37)
Albumin: 3.8 g/dL (ref 3.5–5.2)
Alkaline Phosphatase: 95 U/L (ref 39–117)
BUN: 24 mg/dL — ABNORMAL HIGH (ref 6–23)
CALCIUM: 10.2 mg/dL (ref 8.4–10.5)
CO2: 27 mEq/L (ref 19–32)
Chloride: 101 mEq/L (ref 96–112)
Creatinine, Ser: 1.12 mg/dL (ref 0.40–1.20)
GFR: 50.75 mL/min — ABNORMAL LOW (ref >60.00)
GLUCOSE: 84 mg/dL (ref 70–99)
Potassium: 3.9 mEq/L (ref 3.5–5.1)
Sodium: 136 mEq/L (ref 135–145)
TOTAL PROTEIN: 6.6 g/dL (ref 6.0–8.3)
Total Bilirubin: 0.6 mg/dL (ref 0.2–1.2)

## 2014-02-07 NOTE — Progress Notes (Signed)
   Subjective:    Patient ID: Christie Stevenson, female    DOB: 10-Oct-1940, 74 y.o.   MRN: [id number]  HPI  [name] is a 74 yo female with a CC of following up on HTN.   1)  Stable on Medications Amlodipine and Maxzide. No concerns or refills needed today. Pt to re-check CMET for history of hyponatremia and decreased GFR.                 Review of Systems  Constitutional: Negative for fever, chills, diaphoresis and fatigue.  Eyes: Negative for visual disturbance.  Respiratory: Negative for chest tightness, shortness of breath and wheezing.   Cardiovascular: Negative for chest pain, palpitations and leg swelling.  Gastrointestinal: Negative for nausea, vomiting, abdominal pain and diarrhea.  Skin: Negative for rash.      Objective:   Physical Exam  Constitutional: She is oriented to person, place, and time. She appears well-developed and well-nourished. No distress.  BP 130/70 mmHg  Pulse 63  Resp 14  Ht 5' 0.5" (1.537 m)  Wt 159 lb 8 oz (72.349 kg)  BMI 30.63 kg/m2  SpO2 98%   HENT:  Head: Normocephalic and atraumatic.  Right Ear: External ear normal.  Left Ear: External ear normal.  Cardiovascular: Normal rate, regular rhythm, normal heart sounds and intact distal pulses.  Exam reveals no gallop and no friction rub.   No murmur heard. Pulmonary/Chest: Effort normal and breath sounds normal. No respiratory distress. She has no wheezes. She has no rales. She exhibits no tenderness.  Neurological: She is alert and oriented to person, place, and time. No cranial nerve deficit. She exhibits normal muscle tone. Coordination normal.  Skin: Skin is warm and dry. No rash noted. She is not diaphoretic.  Psychiatric: She has a normal mood and affect. Her behavior is normal. Judgment and thought content normal.      Assessment & Plan:

## 2014-02-07 NOTE — Progress Notes (Signed)
Pre visit review using our clinic review tool, if applicable. No additional management support is needed unless otherwise documented below in the visit note. 

## 2014-02-07 NOTE — Patient Instructions (Signed)
We will contact you via MyChart about your results.   Thanks!

## 2014-02-09 NOTE — Assessment & Plan Note (Signed)
BP Readings from Last 3 Encounters:  02/07/14 130/70  01/03/14 120/68  12/04/13 118/71   BP well controlled on Norvasc 5 mg daily and Maxzide 25 daily. Also, currently on Lipitor. CMET re-checked and stable kidney function was found. No hyponatremia. FU prn.

## 2014-02-20 ENCOUNTER — Other Ambulatory Visit: Payer: Self-pay | Admitting: Internal Medicine

## 2014-03-12 ENCOUNTER — Other Ambulatory Visit: Payer: Self-pay | Admitting: Internal Medicine

## 2014-05-07 ENCOUNTER — Other Ambulatory Visit: Payer: Self-pay | Admitting: Internal Medicine

## 2014-06-13 ENCOUNTER — Encounter: Payer: Self-pay | Admitting: *Deleted

## 2014-06-18 NOTE — Telephone Encounter (Signed)
Unread mychart message mailed to patient 

## 2014-07-15 ENCOUNTER — Other Ambulatory Visit: Payer: Self-pay | Admitting: Nurse Practitioner

## 2014-10-10 ENCOUNTER — Other Ambulatory Visit: Payer: Self-pay | Admitting: Internal Medicine

## 2014-10-10 NOTE — Telephone Encounter (Signed)
Last OV 1.21.16.  Please advise refill

## 2015-01-14 ENCOUNTER — Other Ambulatory Visit: Payer: Self-pay | Admitting: Nurse Practitioner

## 2015-02-16 ENCOUNTER — Other Ambulatory Visit: Payer: Self-pay | Admitting: Internal Medicine

## 2015-02-17 NOTE — Telephone Encounter (Signed)
Last OV 1/16 ok to fill?

## 2015-02-19 ENCOUNTER — Ambulatory Visit (INDEPENDENT_AMBULATORY_CARE_PROVIDER_SITE_OTHER): Payer: Medicare Other

## 2015-02-19 VITALS — BP 122/72 | HR 60 | Temp 97.1°F | Resp 14 | Ht 60.0 in | Wt 171.0 lb

## 2015-02-19 DIAGNOSIS — E785 Hyperlipidemia, unspecified: Secondary | ICD-10-CM

## 2015-02-19 DIAGNOSIS — E2839 Other primary ovarian failure: Secondary | ICD-10-CM

## 2015-02-19 DIAGNOSIS — Z0000 Encounter for general adult medical examination without abnormal findings: Secondary | ICD-10-CM

## 2015-02-19 DIAGNOSIS — Z1239 Encounter for other screening for malignant neoplasm of breast: Secondary | ICD-10-CM

## 2015-02-19 LAB — LIPID PANEL
Cholesterol: 147 mg/dL (ref 0–200)
HDL: 50.9 mg/dL (ref >39.00)
LDL Cholesterol: 72 mg/dL (ref 0–99)
NonHDL: 95.98
TRIGLYCERIDES: 118 mg/dL (ref 0.0–149.0)
Total CHOL/HDL Ratio: 3
VLDL: 23.6 mg/dL (ref 0.0–40.0)

## 2015-02-19 LAB — CBC WITH DIFFERENTIAL/PLATELET
BASOS ABS: 0 10*3/uL (ref 0.0–0.1)
BASOS PCT: 0.5 % (ref 0.0–3.0)
EOS ABS: 0.2 10*3/uL (ref 0.0–0.7)
Eosinophils Relative: 1.7 % (ref 0.0–5.0)
HEMATOCRIT: 41.1 % (ref 36.0–46.0)
HEMOGLOBIN: 13.1 g/dL (ref 12.0–15.0)
LYMPHS PCT: 17.5 % (ref 12.0–46.0)
Lymphs Abs: 1.7 10*3/uL (ref 0.7–4.0)
MCHC: 31.9 g/dL (ref 30.0–36.0)
MCV: 83.9 fl (ref 78.0–100.0)
MONO ABS: 0.7 10*3/uL (ref 0.1–1.0)
Monocytes Relative: 7.5 % (ref 3.0–12.0)
Neutro Abs: 6.9 10*3/uL (ref 1.4–7.7)
Neutrophils Relative %: 72.8 % (ref 43.0–77.0)
Platelets: 279 10*3/uL (ref 150.0–400.0)
RBC: 4.89 Mil/uL (ref 3.87–5.11)
RDW: 16.7 % — ABNORMAL HIGH (ref 11.5–15.5)
WBC: 9.4 10*3/uL (ref 4.0–10.5)

## 2015-02-19 LAB — TSH: TSH: 1.31 u[IU]/mL (ref 0.35–4.50)

## 2015-02-19 LAB — COMPREHENSIVE METABOLIC PANEL
ALBUMIN: 4 g/dL (ref 3.5–5.2)
ALT: 14 U/L (ref 0–35)
AST: 19 U/L (ref 0–37)
Alkaline Phosphatase: 87 U/L (ref 39–117)
BILIRUBIN TOTAL: 0.4 mg/dL (ref 0.2–1.2)
BUN: 16 mg/dL (ref 6–23)
CALCIUM: 10.2 mg/dL (ref 8.4–10.5)
CO2: 28 mEq/L (ref 19–32)
CREATININE: 1.09 mg/dL (ref 0.40–1.20)
Chloride: 101 mEq/L (ref 96–112)
GFR: 52.21 mL/min — ABNORMAL LOW (ref >60.00)
Glucose, Bld: 88 mg/dL (ref 70–99)
Potassium: 3.7 mEq/L (ref 3.5–5.1)
SODIUM: 138 meq/L (ref 135–145)
TOTAL PROTEIN: 7.2 g/dL (ref 6.0–8.3)

## 2015-02-19 NOTE — Patient Instructions (Addendum)
[name],  Thank you for taking time to come for your Medicare Wellness Visit.  I appreciate your ongoing commitment to your health goals. Please review the following plan we discussed and let me know if I can assist you in the future.  Mammogram as directed. DEXA Scan as directed.  Return in February for annual exam.  Bone Densitometry Bone densitometry is an imaging test that uses a special X-ray to measure the amount of calcium and other minerals in your bones (bone density). This test is also known as a bone mineral density test or dual-energy X-ray absorptiometry (DXA). The test can measure bone density at your hip and your spine. It is similar to having a regular X-ray. You may have this test to:  Diagnose a condition that causes weak or thin bones (osteoporosis).  Predict your risk of a broken bone (fracture).  Determine how well osteoporosis treatment is working. LET [facility] CARE PROVIDER KNOW ABOUT:  Any allergies you have.  All medicines you are taking, including vitamins, herbs, eye drops, creams, and over-the-counter medicines.  Previous problems you or members of your family have had with the use of anesthetics.  Any blood disorders you have.  Previous surgeries you have had.  Medical conditions you have.  Possibility of pregnancy.  Any other medical test you had within the previous 14 days that used contrast material. RISKS AND COMPLICATIONS Generally, this is a safe procedure. However, problems can occur and may include the following:  This test exposes you to a very small amount of radiation.  The risks of radiation exposure may be greater to unborn children. BEFORE THE PROCEDURE  Do not take any calcium supplements for 24 hours before having the test. You can otherwise eat and drink what you usually do.  Take off all metal jewelry, eyeglasses, dental appliances, and any other metal objects. PROCEDURE  You may lie on an exam table. There will be  an X-ray generator below you and an imaging device above you.  Other devices, such as boxes or braces, may be used to position your body properly for the scan.  You will need to lie still while the machine slowly scans your body.  The images will show up on a computer monitor. AFTER THE PROCEDURE You may need more testing at a later time.   This information is not intended to replace advice given to you by your health care provider. Make sure you discuss any questions you have with your health care provider.   Document Released: 01/27/2004 Document Revised: 01/25/2014 Document Reviewed: 06/14/2013 Elsevier Interactive Patient Education 2016 [place] A mammogram is an X-ray of the breasts that is done to check for abnormal changes. This procedure can screen for and detect any changes that may suggest breast cancer. A mammogram can also identify other changes and variations in the breast, such as:  Inflammation of the breast tissue (mastitis).  An infected area that contains a collection of pus (abscess).  A fluid-filled sac (cyst).  Fibrocystic changes. This is when breast tissue becomes denser, which can make the tissue feel rope-like or uneven under the skin.  Tumors that are not cancerous (benign). LET [facility] CARE PROVIDER KNOW ABOUT:  Any allergies you have.  If you have breast implants.  If you have had previous breast disease, biopsy, or surgery.  If you are breastfeeding.  Any possibility that you could be pregnant, if this applies.  If you are younger than age 24.  If you have a family history of breast cancer. RISKS AND COMPLICATIONS Generally, this is a safe procedure. However, problems may occur, including:  Exposure to radiation. Radiation levels are very low with this test.  The results being misinterpreted.  The need for further tests.  The inability of the mammogram to detect certain cancers. BEFORE THE PROCEDURE  Schedule your  test about 1-2 weeks after your menstrual period. This is usually when your breasts are the least tender.  If you have had a mammogram done at a different facility in the past, get the mammogram X-rays or have them sent to your current exam facility in order to compare them.  Wash your breasts and under your arms the day of the test.  Do not wear deodorants, perfumes, lotions, or powders anywhere on your body on the day of the test.  Remove any jewelry from your neck.  Wear clothes that you can change into and out of easily. PROCEDURE  You will undress from the waist up and put on a gown.  You will stand in front of the X-ray machine.  Each breast will be placed between two plastic or glass plates. The plates will compress your breast for a few seconds. Try to stay as relaxed as possible during the procedure. This does not cause any harm to your breasts and any discomfort you feel will be very brief.  X-rays will be taken from different angles of each breast. The procedure may vary among health care providers and hospitals. AFTER THE PROCEDURE  The mammogram will be examined by a specialist (radiologist).  You may need to repeat certain parts of the test, depending on the quality of the images. This is commonly done if the radiologist needs a better view of the breast tissue.  Ask when your test results will be ready. Make sure you get your test results.  You may resume your normal activities.   This information is not intended to replace advice given to you by your health care provider. Make sure you discuss any questions you have with your health care provider.   Document Released: 01/02/2000 Document Revised: 09/25/2014 Document Reviewed: 03/15/2014 Elsevier Interactive Patient Education [employer].

## 2015-02-19 NOTE — Progress Notes (Signed)
Patient ID: Christie Stevenson, female   DOB: [birth year], 75 y.o.   MRN: [id number]   I have read and agree with [name]'s note and actions regarding the patient's health and wellness.   Lorane Gell, NP-C  02/19/2015 2117

## 2015-02-19 NOTE — Progress Notes (Signed)
Subjective:   Christie Stevenson is a 75 y.o. female who presents for an Initial Medicare Annual Wellness Visit.  Review of Systems    No ROS.  Medicare Wellness Visit.  Cardiac Risk Factors include: advanced age (>27mn, >>28women);hypertension     Objective:    Today's Vitals   02/19/15 1057  BP: 122/72  Pulse: 60  Temp: 97.1 F (36.2 C)  TempSrc: Oral  Resp: 14  Height: 5' (1.524 m)  Weight: 171 lb (77.565 kg)  SpO2: 97%    Current Medications (verified) Outpatient Encounter Prescriptions as of 02/19/2015  Medication Sig  . atorvastatin (LIPITOR) 20 MG tablet TAKE 1 TABLET (20 MG TOTAL) BY MOUTH DAILY.  . Calcium Carb-Cholecalciferol (CALCIUM 600 + D PO) Take 600 mg by mouth 2 (two) times daily.  . citalopram (CELEXA) 20 MG tablet TAKE 1 TABLET BY MOUTH EVERY DAY  . latanoprost (XALATAN) 0.005 % ophthalmic solution Place 1 drop into the right eye at bedtime.  . metoprolol succinate (TOPROL-XL) 25 MG 24 hr tablet Take 1 tablet (25 mg total) by mouth daily.  .Marland Kitchentriamterene-hydrochlorothiazide (MAXZIDE-25) 37.5-25 MG per tablet TAKE 1 TABLET BY MOUTH EVERY DAY  . [DISCONTINUED] atorvastatin (LIPITOR) 20 MG tablet TAKE 1 TABLET (20 MG TOTAL) BY MOUTH DAILY.  . [DISCONTINUED] atorvastatin (LIPITOR) 20 MG tablet TAKE 1 TABLET (20 MG TOTAL) BY MOUTH DAILY.  . [DISCONTINUED] gemfibrozil (LOPID) 600 MG tablet Take 1 tablet (600 mg total) by mouth 2 (two) times daily before a meal.  . [DISCONTINUED] metoprolol succinate (TOPROL-XL) 25 MG 24 hr tablet TAKE 1 TABLET BY MOUTH EVERY DAY  . [DISCONTINUED] Multiple Vitamin (MULTIVITAMIN WITH MINERALS) TABS tablet Take 1 tablet by mouth daily.  . [DISCONTINUED] amLODipine (NORVASC) 5 MG tablet Take 5 mg by mouth daily. Reported on 02/19/2015   No facility-administered encounter medications on file as of 02/19/2015.    Allergies (verified) Review of patient's allergies indicates no known allergies.   History: Past Medical History    Diagnosis Date  . History of diverticulitis   . GERD (gastroesophageal reflux disease)   . 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ease   . Hyperlipidemia   . Hypertension   . Migraine   . History of kidney stones   . History of colon polyps   . Osteoporosis    Past Surgical History  Procedure Laterality Date  . Cataract extraction Left 2014   Family History  Problem Relation Age of Onset  . Arthritis Mother   . Hypertension Mother   . Osteoporosis Mother   . Osteoporosis Sister   . Osteoporosis Brother   . Diabetes Daughter   . Diabetes Son   . Cancer Paternal Uncle     Prostate Cancer  . Cancer Sister     Breast Cancer   . Osteoporosis Sister   . Osteoporosis Brother   . Diabetes Daughter    Social History   Occupational History  . Accounting work     Retired   Social History Main Topics  . Smoking status: Former SResearch scientist (life sciences) . Smokeless tobacco: Not on file  . Alcohol Use: Yes     Comment: occasional drink  . Drug Use: No  . Sexual Activity: Not Currently    Tobacco Counseling Counseling given: Not Answered   Activities of Daily Living In your present state of health, do you have any difficulty performing the following activities: 02/19/2015  Hearing? N  Vision? N  Difficulty concentrating or making decisions? N  Walking or climbing stairs? N  Dressing or bathing? N  Doing errands, shopping? N  Preparing Food and eating ? N  Using the Toilet? N  In the past six months, have you accidently leaked urine? N  Do you have problems with loss of bowel control? N  Managing your Medications? N  Managing your Finances? N  Housekeeping or managing your Housekeeping? N    Immunizations and Health Maintenance Immunization History  Administered Date(s) Administered  . Influenza Split 10/19/2013  . Influenza-Unspecified 12/02/2012, 10/15/2014  . Pneumococcal Conjugate-13 12/02/2012   Health Maintenance Due  Topic Date Due  . TETANUS/TDAP  07/24/1960   . MAMMOGRAM  07/25/1991  . COLONOSCOPY  07/25/1991  . ZOSTAVAX  07/24/2001  . DEXA SCAN  07/25/2006  . PNA vac Low Risk Adult (2 of 2 - PPSV23) 12/02/2013    Patient Care Team: Rubbie Battiest, NP as PCP - General  Indicate any recent Medical Services you may have received from other than [facility] providers in the past year (date may be approximate).     Assessment:   This is a routine wellness examination for [facility]. The goal of the wellness visit is to assist the patient how to close the gaps in care and create a preventative care plan for the patient.   VIT D Calcium as appropriate/Osteoporosis risk reviewed.  Medications reviewed; taking without issues or barriers.  Safety issues reviewed; smoke detectors in the home. No firearms in the home. Wears seatbelts when driving or riding with others. No violence in the home.  No identified risk were noted; The patient was oriented x 3; appropriate in dress and manner and no objective failures at ADL's or IADL's.   TDAP and ZOSTAVAX vaccine postponed for follow up with insurance.  Colonoscopy declined.  Follow up with COLOGUARD as an option.  DEXA Scan and Mammogram referral placed.  Patient Concerns:  Pneumococcal 23 vaccine; to be administered at upcoming physical, per patient request. Stage 3 kidney disease; reports previously diagnosed and would like to follow up with proper maintenance. Deferred to PCP for follow up.    Hearing/Vision screen Hearing Screening Comments: Passes the whisper test Vision Screening Comments: Followed by [facility], Dr. Stephenie Acres Wears glasses Annual visits She reports R eye cataract extraction surgery scheduled 03/2015.  Dietary issues and exercise activities discussed: Current Exercise Habits:: The patient does not participate in regular exercise at present  Goals    . Healthy Lifestyle     Choose lean meats (chicken, [place], fish), fruits and vegetables when eating Stay hydrated!  Drink plenty of fluids.  Increase water intake by 1 bottle=2 cups daily.    . Increase physical activity     Start weekly exercises (Tai-Chi) at the [facility] if available.  Begin walking 30 minutes a day, 3 times a week.      Depression Screen PHQ 2/9 Scores 02/19/2015 01/03/2014  PHQ - 2 Score 0 0    Fall Risk Fall Risk  02/19/2015 01/03/2014  Falls in the past year? No No    Cognitive Function: MMSE - Mini Mental State Exam 02/19/2015  Orientation to time 5  Orientation to Place 5  Registration 3  Attention/ Calculation 5  Recall 3  Language- name 2 objects 2  Language- repeat 1  Language- follow 3 step command 3  Language- read & follow direction 1  Write a sentence 1  Copy design 1  Total score 30    Screening Tests Health Maintenance  Topic Date Due  . TETANUS/TDAP  07/24/1960  . MAMMOGRAM  07/25/1991  . COLONOSCOPY  07/25/1991  . ZOSTAVAX  07/24/2001  . DEXA SCAN  07/25/2006  . PNA vac Low Risk Adult (2 of 2 - PPSV23) 12/02/2013  . INFLUENZA VACCINE  08/19/2015      Plan:    End of life planning; Advanced aging; Advanced directives discussed. No HCPOA/Living Will. Educational material provided on how to start the conversation with her family. Encouraged to follow up with PCP when completed.  Time spent discussing HCPOA/Living Will short forms is 20 minutes.  During the course of the visit, Nashae was educated and counseled about the following appropriate screening and preventive services:   Vaccines to include Pneumoccal, Influenza, Hepatitis B, Td, Zostavax, HCV  Electrocardiogram  Cardiovascular disease screening  Colorectal cancer screening  Bone density screening  Diabetes screening  Glaucoma screening  Mammography/PAP  Nutrition counseling  Smoking cessation counseling  Patient Instructions (the written plan) were given to the patient.    Varney Biles, LPN   04/25/8889

## 2015-02-20 ENCOUNTER — Other Ambulatory Visit: Payer: Self-pay | Admitting: Internal Medicine

## 2015-02-24 LAB — HM MAMMOGRAPHY: HM Mammogram: NEGATIVE

## 2015-02-26 ENCOUNTER — Encounter: Payer: Self-pay | Admitting: Nurse Practitioner

## 2015-03-06 ENCOUNTER — Encounter: Payer: Self-pay | Admitting: Nurse Practitioner

## 2015-03-17 ENCOUNTER — Ambulatory Visit (INDEPENDENT_AMBULATORY_CARE_PROVIDER_SITE_OTHER): Payer: Medicare Other | Admitting: Nurse Practitioner

## 2015-03-17 ENCOUNTER — Encounter: Payer: Self-pay | Admitting: Nurse Practitioner

## 2015-03-17 VITALS — BP 104/62 | HR 91 | Temp 98.2°F | Resp 16 | Ht 60.0 in | Wt 168.8 lb

## 2015-03-17 DIAGNOSIS — H269 Unspecified cataract: Secondary | ICD-10-CM

## 2015-03-17 DIAGNOSIS — R5383 Other fatigue: Secondary | ICD-10-CM | POA: Diagnosis not present

## 2015-03-17 DIAGNOSIS — Z23 Encounter for immunization: Secondary | ICD-10-CM

## 2015-03-17 DIAGNOSIS — I10 Essential (primary) hypertension: Secondary | ICD-10-CM

## 2015-03-17 DIAGNOSIS — I6521 Occlusion and stenosis of right carotid artery: Secondary | ICD-10-CM

## 2015-03-17 DIAGNOSIS — Z1211 Encounter for screening for malignant neoplasm of colon: Secondary | ICD-10-CM

## 2015-03-17 DIAGNOSIS — Z0000 Encounter for general adult medical examination without abnormal findings: Secondary | ICD-10-CM

## 2015-03-17 DIAGNOSIS — E785 Hyperlipidemia, unspecified: Secondary | ICD-10-CM

## 2015-03-17 DIAGNOSIS — Z1239 Encounter for other screening for malignant neoplasm of breast: Secondary | ICD-10-CM

## 2015-03-17 NOTE — Progress Notes (Signed)
Patient ID: Christie Stevenson, female    DOB: November 20, 1940  Age: 75 y.o. MRN: [id number]  CC: Annual Exam   HPI Christie Stevenson presents for Annual physical exam. Medicare Wellness Initial was done by [facility] on 02/19/15.   1) Annual Physical   Diet- Lean meats and drinking water daily was goal at [facility] Goal- partially met- drinking more water   Exercise- Goals to start Tai-Chi and walking 30 min daily x 3 weeks Goal- Not met- hasn't been by senior center   Immunizations-    Flu- Ordered at [facility]   Tdap- Declined    PNA- needs pneumococcal 23 today   Mammogram- Bi-rads 1   Bone Density- Ordered at [facility]   On fosamax for 8 yrs   Colonoscopy- Wants cologuard  Eye Exam- Cataracts Right eye on 14th   Dental Exam- UTD  Labs- Done on 2/1/7 and reviewed with pt  Fall- Neg.   Depression- Neg.  Refills: Denies   Questions about Kidney Function  HCPAO/Living Will status- Done, needs to drop off    History Christie Stevenson has a past medical history of History of diverticulitis; GERD (gastroesophageal reflux disease); 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ease; Hyperlipidemia; Hypertension; Migraine; History of kidney stones; History of colon polyps; and Osteoporosis.   She has past surgical history that includes Cataract extraction (Left, 2014).   Her family history includes Arthritis in her mother; Cancer in her paternal uncle and sister; Diabetes in her daughter, daughter, and son; Hypertension in her mother; Osteoporosis in her brother, brother, mother, sister, and sister.She reports that she has quit smoking. She does not have any smokeless tobacco history on file. She reports that she drinks alcohol. She reports that she does not use illicit drugs.  Outpatient Prescriptions Prior to Visit  Medication Sig Dispense Refill  . atorvastatin (LIPITOR) 20 MG tablet TAKE 1 TABLET (20 MG TOTAL) BY MOUTH DAILY. 30 tablet 5  . Calcium Carb-Cholecalciferol (CALCIUM 600 + D PO) Take 600 mg by mouth 2 (two)  times daily.    . citalopram (CELEXA) 20 MG tablet TAKE 1 TABLET BY MOUTH EVERY DAY 30 tablet 2  . latanoprost (XALATAN) 0.005 % ophthalmic solution Place 1 drop into the right eye at bedtime.    . metoprolol succinate (TOPROL-XL) 25 MG 24 hr tablet Take 1 tablet (25 mg total) by mouth daily. 30 tablet 6  . triamterene-hydrochlorothiazide (MAXZIDE-25) 37.5-25 MG tablet TAKE 1 TABLET BY MOUTH EVERY DAY 30 tablet 5  . metoprolol succinate (TOPROL-XL) 25 MG 24 hr tablet TAKE 1 TABLET BY MOUTH EVERY DAY 30 tablet 6   No facility-administered medications prior to visit.    ROS Review of Systems  Constitutional: Negative for fever, chills, diaphoresis and fatigue.  Eyes: Negative for visual disturbance.  Respiratory: Negative for chest tightness, shortness of breath and wheezing.   Cardiovascular: Negative for chest pain, palpitations and leg swelling.  Gastrointestinal: Negative for nausea, vomiting and diarrhea.  Skin: Negative for rash.  Neurological: Negative for dizziness, weakness, numbness and headaches.  Psychiatric/Behavioral: The patient is not nervous/anxious.     Objective:  BP 104/62 mmHg  Pulse 91  Temp(Src) 98.2 F (36.8 C) (Oral)  Resp 16  Ht 5' (1.524 m)  Wt 168 lb 12.8 oz (76.567 kg)  BMI 32.97 kg/m2  SpO2 96%  Physical Exam  Constitutional: She is oriented to person, place, and time. She appears well-developed and well-nourished. No distress.  HENT:  Head: Normocephalic and atraumatic.  Right Ear:  External ear normal.  Left Ear: External ear normal.  Nose: Nose normal.  Mouth/Throat: Oropharynx is clear and moist. No oropharyngeal exudate.  TMs and canals clear bilaterally  Eyes: Conjunctivae and EOM are normal. Pupils are equal, round, and reactive to light. Right eye exhibits no discharge. Left eye exhibits no discharge. No scleral icterus.  Neck: Normal range of motion. Neck supple. No thyromegaly present.  Cardiovascular: Normal rate, regular rhythm and  normal heart sounds.  Exam reveals no gallop and no friction rub.   No murmur heard. Pulmonary/Chest: Effort normal and breath sounds normal. No respiratory distress. She has no wheezes. She has no rales. She exhibits no tenderness.  Abdominal: Soft. Bowel sounds are normal. She exhibits no distension and no mass. There is no tenderness. There is no rebound and no guarding.  Musculoskeletal: Normal range of motion. She exhibits no edema or tenderness.  Lymphadenopathy:    She has no cervical adenopathy.  Neurological: She is alert and oriented to person, place, and time. She has normal reflexes. No cranial nerve deficit. She exhibits normal muscle tone. Coordination normal.  Skin: Skin is warm and dry. No rash noted. She is not diaphoretic. No erythema. No pallor.  Psychiatric: She has a normal mood and affect. Her behavior is normal. Judgment and thought content normal.   Assessment & Plan:   Christie Stevenson was seen today for annual exam.  Diagnoses and all orders for this visit:  Need for 23-polyvalent pneumococcal polysaccharide vaccine -     Pneumococcal polysaccharide vaccine 23-valent greater than or equal to 2yo subcutaneous/IM  Routine general medical examination at a health care facility  Screening for breast cancer  Other fatigue  Hyperlipidemia  Essential hypertension  Carotid stenosis, right  Special screening for malignant neoplasms, colon  Cataracts, bilateral   I am having Christie Stevenson maintain her Calcium Carb-Cholecalciferol (CALCIUM 600 + D PO), latanoprost, metoprolol succinate, atorvastatin, citalopram, and triamterene-hydrochlorothiazide.  No orders of the defined types were placed in this encounter.     Follow-up: Return in about 1 year (around 03/16/2016) for CPE.

## 2015-03-17 NOTE — Patient Instructions (Signed)

## 2015-03-23 DIAGNOSIS — Z0000 Encounter for general adult medical examination without abnormal findings: Secondary | ICD-10-CM | POA: Insufficient documentation

## 2015-03-23 DIAGNOSIS — Z1211 Encounter for screening for malignant neoplasm of colon: Secondary | ICD-10-CM | POA: Insufficient documentation

## 2015-03-23 DIAGNOSIS — H269 Unspecified cataract: Secondary | ICD-10-CM | POA: Insufficient documentation

## 2015-03-23 NOTE — Assessment & Plan Note (Signed)
Bi-rads 1  02/25/15 [facility]

## 2015-03-23 NOTE — Assessment & Plan Note (Signed)
Continue lipitor 20 mg each evening  Lab Results  Component Value Date   CHOL 147 02/19/2015   HDL 50.90 02/19/2015   LDLCALC 72 02/19/2015   TRIG 118.0 02/19/2015   CHOLHDL 3 02/19/2015

## 2015-03-23 NOTE — Assessment & Plan Note (Signed)
Filled out form to order Cologuard Given to staff to complete

## 2015-03-23 NOTE — Assessment & Plan Note (Signed)
Unsure about status of this at this time

## 2015-03-23 NOTE — Assessment & Plan Note (Signed)
Stable currently No further follow up or work up

## 2015-03-23 NOTE — Assessment & Plan Note (Signed)
BP Readings from Last 3 Encounters:  03/17/15 104/62  02/19/15 122/72  02/07/14 130/70   Stable. Continue current regimen.

## 2015-03-23 NOTE — Assessment & Plan Note (Signed)
Patient has had left eye cataract removed Patient is getting the right eye removal of cataract on March 14

## 2015-03-23 NOTE — Assessment & Plan Note (Signed)
Discussed acute and chronic issues. Reviewed health maintenance measures, PFSHx, and immunizations.   Pneumococcal 23 vaccine given today  Health Maintenance further discussed and advised her to bring HCPAO/Living Will copy for [place] to place on file Goals were re-assessed and encouraged her to meet these goals or revise.  Cologuard ordered

## 2015-03-27 ENCOUNTER — Encounter: Payer: Self-pay | Admitting: *Deleted

## 2015-03-27 LAB — COLOGUARD: Cologuard: POSITIVE

## 2015-03-31 ENCOUNTER — Encounter: Payer: Self-pay | Admitting: *Deleted

## 2015-04-01 ENCOUNTER — Ambulatory Visit
Admission: RE | Admit: 2015-04-01 | Discharge: 2015-04-01 | Disposition: A | Discharge status: Home / Self Care | Payer: Medicare Other | Source: Ambulatory Visit | Attending: Ophthalmology | Admitting: Ophthalmology

## 2015-04-01 ENCOUNTER — Ambulatory Visit: Payer: Medicare Other | Admitting: Anesthesiology

## 2015-04-01 ENCOUNTER — Encounter
Admission: RE | Disposition: A | Discharge status: Home / Self Care | Payer: Self-pay | Source: Ambulatory Visit | Attending: Ophthalmology

## 2015-04-01 ENCOUNTER — Encounter: Payer: Self-pay | Admitting: *Deleted

## 2015-04-01 DIAGNOSIS — F329 Major depressive disorder, single episode, unspecified: Secondary | ICD-10-CM | POA: Insufficient documentation

## 2015-04-01 DIAGNOSIS — Z9842 Cataract extraction status, left eye: Secondary | ICD-10-CM | POA: Diagnosis not present

## 2015-04-01 DIAGNOSIS — K219 Gastro-esophageal reflux disease without esophagitis: Secondary | ICD-10-CM | POA: Insufficient documentation

## 2015-04-01 DIAGNOSIS — I499 Cardiac arrhythmia, unspecified: Secondary | ICD-10-CM | POA: Diagnosis not present

## 2015-04-01 DIAGNOSIS — N183 Chronic kidney disease, stage 3 (moderate): Secondary | ICD-10-CM | POA: Insufficient documentation

## 2015-04-01 DIAGNOSIS — E7800 Pure hypercholesterolemia, unspecified: Secondary | ICD-10-CM | POA: Diagnosis not present

## 2015-04-01 DIAGNOSIS — I129 Hypertensive chronic kidney disease with stage 1 through stage 4 chronic kidney disease, or unspecified chronic kidney disease: Secondary | ICD-10-CM | POA: Insufficient documentation

## 2015-04-01 DIAGNOSIS — H2511 Age-related nuclear cataract, right eye: Secondary | ICD-10-CM | POA: Diagnosis not present

## 2015-04-01 DIAGNOSIS — Z87891 Personal history of nicotine dependence: Secondary | ICD-10-CM | POA: Diagnosis not present

## 2015-04-01 DIAGNOSIS — M810 Age-related osteoporosis without current pathological fracture: Secondary | ICD-10-CM | POA: Insufficient documentation

## 2015-04-01 HISTORY — DX: Major depressive disorder, single episode, unspecified: F32.9

## 2015-04-01 HISTORY — PX: CATARACT EXTRACTION W/PHACO: SHX586

## 2015-04-01 HISTORY — DX: Chronic kidney disease, unspecified: N18.9

## 2015-04-01 HISTORY — DX: Depression, unspecified: F32.A

## 2015-04-01 SURGERY — PHACOEMULSIFICATION, CATARACT, WITH IOL INSERTION
Anesthesia: Monitor Anesthesia Care | Site: Eye | Laterality: Right | Wound class: Clean

## 2015-04-01 MED ORDER — SODIUM CHLORIDE 0.9 % IV SOLN
INTRAVENOUS | Status: DC
  Administered 2015-04-01 (×2): via INTRAVENOUS

## 2015-04-01 MED ORDER — NA CHONDROIT SULF-NA HYALURON 40-17 MG/ML IO SOLN
INTRAOCULAR | Status: DC | PRN
  Administered 2015-04-01: 1 mL via INTRAOCULAR

## 2015-04-01 MED ORDER — ARMC OPHTHALMIC DILATING GEL
OPHTHALMIC | Status: AC
  Administered 2015-04-01: 1 via OPHTHALMIC
  Filled 2015-04-01: qty 0.25

## 2015-04-01 MED ORDER — POVIDONE-IODINE 5 % OP SOLN
OPHTHALMIC | Status: AC
  Administered 2015-04-01: 1 via OPHTHALMIC
  Filled 2015-04-01: qty 30

## 2015-04-01 MED ORDER — FENTANYL CITRATE (PF) 100 MCG/2ML IJ SOLN
INTRAMUSCULAR | Status: DC | PRN
  Administered 2015-04-01: 50 ug via INTRAVENOUS

## 2015-04-01 MED ORDER — MOXIFLOXACIN HCL 0.5 % OP SOLN
OPHTHALMIC | Status: AC
  Filled 2015-04-01: qty 3

## 2015-04-01 MED ORDER — CEFUROXIME OPHTHALMIC INJECTION 1 MG/0.1 ML
INJECTION | OPHTHALMIC | Status: AC
  Filled 2015-04-01: qty 0.1

## 2015-04-01 MED ORDER — TETRACAINE HCL 0.5 % OP SOLN
1.0000 [drp] | OPHTHALMIC | Status: AC | PRN
  Administered 2015-04-01: 1 [drp] via OPHTHALMIC

## 2015-04-01 MED ORDER — TETRACAINE HCL 0.5 % OP SOLN
OPHTHALMIC | Status: AC
  Administered 2015-04-01: 1 [drp] via OPHTHALMIC
  Filled 2015-04-01: qty 2

## 2015-04-01 MED ORDER — NA CHONDROIT SULF-NA HYALURON 40-17 MG/ML IO SOLN
INTRAOCULAR | Status: AC
  Filled 2015-04-01: qty 1

## 2015-04-01 MED ORDER — EPINEPHRINE HCL 1 MG/ML IJ SOLN
INTRAMUSCULAR | Status: AC
  Filled 2015-04-01: qty 1

## 2015-04-01 MED ORDER — EPINEPHRINE HCL 1 MG/ML IJ SOLN
INTRAOCULAR | Status: DC | PRN
  Administered 2015-04-01: 1 mL via OPHTHALMIC

## 2015-04-01 MED ORDER — CEFUROXIME OPHTHALMIC INJECTION 1 MG/0.1 ML
INJECTION | OPHTHALMIC | Status: DC | PRN
  Administered 2015-04-01: .1 mL via INTRACAMERAL

## 2015-04-01 MED ORDER — ARMC OPHTHALMIC DILATING GEL
1.0000 "application " | OPHTHALMIC | Status: AC | PRN
  Administered 2015-04-01 (×2): 1 via OPHTHALMIC

## 2015-04-01 MED ORDER — CARBACHOL 0.01 % IO SOLN
INTRAOCULAR | Status: DC | PRN
  Administered 2015-04-01: .5 mL via INTRAOCULAR

## 2015-04-01 MED ORDER — MOXIFLOXACIN HCL 0.5 % OP SOLN
OPHTHALMIC | Status: DC | PRN
  Administered 2015-04-01: 1 [drp] via OPHTHALMIC

## 2015-04-01 MED ORDER — MIDAZOLAM HCL 2 MG/2ML IJ SOLN
INTRAMUSCULAR | Status: DC | PRN
  Administered 2015-04-01: 1 mg via INTRAVENOUS

## 2015-04-01 MED ORDER — MOXIFLOXACIN HCL 0.5 % OP SOLN: 1.0000 [drp] | OPHTHALMIC | Status: DC | PRN

## 2015-04-01 MED ORDER — POVIDONE-IODINE 5 % OP SOLN
1.0000 "application " | OPHTHALMIC | Status: AC | PRN
  Administered 2015-04-01: 1 via OPHTHALMIC

## 2015-04-01 SURGICAL SUPPLY — 22 items
CANNULA ANT/CHMB 27GA (MISCELLANEOUS) ×3 IMPLANT
CUP MEDICINE 2OZ PLAST GRAD ST (MISCELLANEOUS) ×3 IMPLANT
GLOVE BIO SURGEON STRL SZ8 (GLOVE) ×3 IMPLANT
GLOVE BIOGEL M 6.5 STRL (GLOVE) ×3 IMPLANT
GLOVE SURG LX 8.0 MICRO (GLOVE) ×2
GLOVE SURG LX STRL 8.0 MICRO (GLOVE) ×1 IMPLANT
GOWN STRL REUS W/ TWL LRG LVL3 (GOWN DISPOSABLE) ×2 IMPLANT
GOWN STRL REUS W/TWL LRG LVL3 (GOWN DISPOSABLE) ×4
LENS IOL TECNIS 22.0 (Intraocular Lens) ×3 IMPLANT
LENS IOL TECNIS MONO 1P 22.0 (Intraocular Lens) ×1 IMPLANT
PACK CATARACT (MISCELLANEOUS) ×3 IMPLANT
PACK CATARACT BRASINGTON LX (MISCELLANEOUS) ×3 IMPLANT
PACK EYE AFTER SURG (MISCELLANEOUS) ×3 IMPLANT
SOL BSS BAG (MISCELLANEOUS) ×3
SOL PREP PVP 2OZ (MISCELLANEOUS) ×3
SOLUTION BSS BAG (MISCELLANEOUS) ×1 IMPLANT
SOLUTION PREP PVP 2OZ (MISCELLANEOUS) ×1 IMPLANT
SYR 3ML LL SCALE MARK (SYRINGE) ×3 IMPLANT
SYR 5ML LL (SYRINGE) ×3 IMPLANT
SYR TB 1ML 27GX1/2 LL (SYRINGE) ×3 IMPLANT
WATER STERILE IRR 1000ML POUR (IV SOLUTION) ×3 IMPLANT
WIPE NON LINTING 3.25X3.25 (MISCELLANEOUS) ×3 IMPLANT

## 2015-04-01 NOTE — Anesthesia Postprocedure Evaluation (Signed)
Anesthesia Post Note  Patient: Christie Stevenson  Procedure(s) Performed: Procedure(s) (LRB): CATARACT EXTRACTION PHACO AND INTRAOCULAR LENS PLACEMENT (IOC) (Right)  Patient location during evaluation: PACU Anesthesia Type: MAC Level of consciousness: awake Pain management: pain level controlled Vital Signs Assessment: post-procedure vital signs reviewed and stable Respiratory status: spontaneous breathing Cardiovascular status: blood pressure returned to baseline Postop Assessment: no headache Anesthetic complications: no    Last Vitals:  Filed Vitals:   04/01/15 0740  BP: 115/70  Pulse: 80  Temp: 36.4 C  Resp: 16    Last Pain: There were no vitals filed for this visit.               Buckner Malta

## 2015-04-01 NOTE — Transfer of Care (Signed)
Immediate Anesthesia Transfer of Care Note  Patient: Christie Stevenson  Procedure(s) Performed: Procedure(s) with comments: CATARACT EXTRACTION PHACO AND INTRAOCULAR LENS PLACEMENT (IOC) (Right) - [place] 00:47 AP% 20.8 CDE 9.76 fluid pack lot # [id number]  Patient Location: PACU  Anesthesia Type:MAC  Level of Consciousness: awake  Airway & Oxygen Therapy: Patient Spontanous Breathing  Post-op Assessment: Report given to RN  Post vital signs: Reviewed and stable  Last Vitals:  Filed Vitals:   04/01/15 0740  BP: 115/70  Pulse: 80  Temp: 36.4 C  Resp: 16    Complications: No apparent anesthesia complications

## 2015-04-01 NOTE — Discharge Instructions (Signed)
AMBULATORY SURGERY  DISCHARGE INSTRUCTIONS   1) The drugs that you were given will stay in your system until tomorrow so for the next 24 hours you should not:  A) Drive an automobile B) Make any legal decisions C) Drink any alcoholic beverage   2) You may resume regular meals tomorrow.  Today it is better to start with liquids and gradually work up to solid foods.  You may eat anything you prefer, but it is better to start with liquids, then soup and crackers, and gradually work up to solid foods.   3) Please notify your doctor immediately if you have any unusual bleeding, trouble breathing, redness and pain at the surgery site, drainage, fever, or pain not relieved by medication.    4) Additional Instructions:    Eye Surgery Discharge Instructions  Expect mild scratchy sensation or mild soreness. DO NOT RUB YOUR EYE!  The day of surgery:  Minimal physical activity, but bed rest is not required  No reading, computer work, or close hand work  No bending, lifting, or straining.  May watch TV  For 24 hours:  No driving, legal decisions, or alcoholic beverages  Safety precautions  Eat anything you prefer: It is better to start with liquids, then soup then solid foods.  _____ Eye patch should be worn until postoperative exam tomorrow.  ____ Solar shield eyeglasses should be worn for comfort in the sunlight/patch while sleeping  Resume all regular medications including aspirin or Coumadin if these were discontinued prior to surgery. You may shower, bathe, shave, or wash your hair. Tylenol may be taken for mild discomfort.  Call your doctor if you experience significant pain, nausea, or vomiting, fever > 101 or other signs of infection. [phone] or [phone] Specific instructions:  Follow-up Information    Follow up with PORFILIO,WILLIAM LOUIS, MD In 1 day.   Specialty:  Ophthalmology   Why:  March 15 at 945am   Contact information:   [place] [address] [phone]         Please contact your physician with any problems or Same Day Surgery at [phone], Monday through Friday 6 am to 4 pm, or [facility] number at [phone].

## 2015-04-01 NOTE — Anesthesia Procedure Notes (Signed)
Procedure Name: MAC Date/Time: 04/01/2015 9:15 AM Performed by: Allean Found Pre-anesthesia Checklist: Patient identified, Emergency Drugs available, Suction available, Timeout performed and Patient being monitored Patient Re-evaluated:Patient Re-evaluated prior to inductionOxygen Delivery Method: Nasal cannula Preoxygenation: Pre-oxygenation with 100% oxygen Intubation Type: IV induction

## 2015-04-01 NOTE — Op Note (Signed)
PREOPERATIVE DIAGNOSIS:  Nuclear sclerotic cataract of the right eye.   POSTOPERATIVE DIAGNOSIS: nuclear sclerotic cataract right eye   OPERATIVE PROCEDURE:  Procedure(s): CATARACT EXTRACTION PHACO AND INTRAOCULAR LENS PLACEMENT (IOC)   SURGEON:  [name], MD.   ANESTHESIA:  Anesthesiologist: Gijsbertus Lonia Mad, MD CRNA: Allean Found, CRNA  1.      Managed anesthesia care. 2.      Topical tetracaine drops followed by 2% Xylocaine jelly applied in the preoperative holding area.   COMPLICATIONS:  None.   TECHNIQUE:   Stop and chop   DESCRIPTION OF PROCEDURE:  The patient was examined and consented in the preoperative holding area where the aforementioned topical anesthesia was applied to the right eye and then brought back to the Operating Room where the right eye was prepped and draped in the usual sterile ophthalmic fashion and a lid speculum was placed. A paracentesis was created with the side port blade and the anterior chamber was filled with viscoelastic. A near clear corneal incision was performed with the steel keratome. A continuous curvilinear capsulorrhexis was performed with a cystotome followed by the capsulorrhexis forceps. Hydrodissection and hydrodelineation were carried out with BSS on a blunt cannula. The lens was removed in a stop and chop  technique and the remaining cortical material was removed with the irrigation-aspiration handpiece. The capsular bag was inflated with viscoelastic and the Technis [id number]  lens was placed in the capsular bag without complication. The remaining viscoelastic was removed from the eye with the irrigation-aspiration handpiece. The wounds were hydrated. The anterior chamber was flushed with Miostat and the eye was inflated to physiologic pressure. 0.1 mL of cefuroxime concentration 10 mg/mL was placed in the anterior chamber. The wounds were found to be water tight. The eye was dressed with Vigamox. The patient was given protective  glasses to wear throughout the day and a shield with which to sleep tonight. The patient was also given drops with which to begin a drop regimen today and will follow-up with me in one day.  Implant Name Type Inv. Item Serial No. Manufacturer Lot No. LRB No. Used  LENS IOL TECNIS 22.0 - [id number] Intraocular Lens LENS IOL TECNIS 22.0 [id number]   Right 1   Procedure(s) with comments: CATARACT EXTRACTION PHACO AND INTRAOCULAR LENS PLACEMENT (IOC) (Right) - [place] 00:47 AP% 20.8 CDE 9.76 fluid pack lot # [id number]  Electronically signed: [place] 04/01/2015 9:38 AM

## 2015-04-01 NOTE — Anesthesia Preprocedure Evaluation (Signed)
Anesthesia Evaluation  Patient identified by MRN, date of birth, ID band Patient awake    Reviewed: Allergy & Precautions, NPO status   History of Anesthesia Complications Negative for: history of anesthetic complications  Airway Mallampati: II       Dental  (+) Teeth Intact   Pulmonary neg pulmonary ROS, former smoker,    Pulmonary exam normal        Cardiovascular Exercise Tolerance: Good hypertension, Pt. on home beta blockers + dysrhythmias Atrial Fibrillation  Rhythm:Irregular     Neuro/Psych Depression    GI/Hepatic Neg liver ROS, GERD  ,  Endo/Other  negative endocrine ROS  Renal/GU Renal disease     Musculoskeletal   Abdominal Normal abdominal exam  (+)   Peds  Hematology negative hematology ROS (+)   Anesthesia Other Findings   Reproductive/Obstetrics                             Anesthesia Physical Anesthesia Plan  ASA: II  Anesthesia Plan: MAC   Post-op Pain Management:    Induction: Intravenous  Airway Management Planned: Natural Airway and Nasal Cannula  Additional Equipment:   Intra-op Plan:   Post-operative Plan:   Informed Consent: I have reviewed the patients History and Physical, chart, labs and discussed the procedure including the risks, benefits and alternatives for the proposed anesthesia with the patient or authorized representative who has indicated his/her understanding and acceptance.     Plan Discussed with: CRNA  Anesthesia Plan Comments:         Anesthesia Quick Evaluation

## 2015-04-01 NOTE — H&P (Signed)
All labs reviewed. Abnormal studies sent to patients PCP when indicated.  Previous H&P reviewed, patient examined, there are NO CHANGES.  Nicklaus Alviar LOUIS3/14/20179:10 AM

## 2015-04-02 ENCOUNTER — Encounter: Payer: Self-pay | Admitting: Ophthalmology

## 2015-04-18 ENCOUNTER — Telehealth: Payer: Self-pay

## 2015-04-18 NOTE — Telephone Encounter (Signed)
-----   Message from Rubbie Battiest, NP sent at 04/18/2015  8:46 AM EDT ----- Please let pt know her Cologuard result was positive and I will be referring her to GI for further work up. I will not place the referral until after you call- last one was too fast. Just send me back an okay done note. Thanks!

## 2015-04-18 NOTE — Telephone Encounter (Signed)
Spoke with Patient and gave her the cologuard results.  She has no preference on GI doctor, awaiting referral. Thanks

## 2015-04-20 ENCOUNTER — Other Ambulatory Visit: Payer: Self-pay | Admitting: Nurse Practitioner

## 2015-04-20 DIAGNOSIS — Z9189 Other specified personal risk factors, not elsewhere classified: Secondary | ICD-10-CM

## 2015-04-20 NOTE — Telephone Encounter (Signed)
Referred.  Thanks.

## 2015-04-23 NOTE — Telephone Encounter (Signed)
The patient left a voice mail regarding not hearing from this office or another office regarding a referral appointment . I called the patient to inform her of her appointment with [facility] on 4.20.17 @ 3:30.

## 2015-05-23 ENCOUNTER — Encounter: Payer: Self-pay | Admitting: *Deleted

## 2015-05-26 ENCOUNTER — Ambulatory Visit: Payer: Medicare Other | Admitting: Anesthesiology

## 2015-05-26 ENCOUNTER — Encounter: Payer: Self-pay | Admitting: *Deleted

## 2015-05-26 ENCOUNTER — Encounter
Admission: RE | Disposition: A | Discharge status: Home / Self Care | Payer: Self-pay | Source: Ambulatory Visit | Attending: Unknown Physician Specialty

## 2015-05-26 ENCOUNTER — Ambulatory Visit
Admission: RE | Admit: 2015-05-26 | Discharge: 2015-05-26 | Disposition: A | Discharge status: Home / Self Care | Payer: Medicare Other | Source: Ambulatory Visit | Attending: Unknown Physician Specialty | Admitting: Unknown Physician Specialty

## 2015-05-26 DIAGNOSIS — Z9851 Tubal ligation status: Secondary | ICD-10-CM | POA: Insufficient documentation

## 2015-05-26 DIAGNOSIS — D122 Benign neoplasm of ascending colon: Secondary | ICD-10-CM | POA: Insufficient documentation

## 2015-05-26 DIAGNOSIS — K219 Gastro-esophageal reflux disease without esophagitis: Secondary | ICD-10-CM | POA: Insufficient documentation

## 2015-05-26 DIAGNOSIS — Z87442 Personal history of urinary calculi: Secondary | ICD-10-CM | POA: Insufficient documentation

## 2015-05-26 DIAGNOSIS — I129 Hypertensive chronic kidney disease with stage 1 through stage 4 chronic kidney disease, or unspecified chronic kidney disease: Secondary | ICD-10-CM | POA: Insufficient documentation

## 2015-05-26 DIAGNOSIS — F329 Major depressive disorder, single episode, unspecified: Secondary | ICD-10-CM | POA: Insufficient documentation

## 2015-05-26 DIAGNOSIS — Z87891 Personal history of nicotine dependence: Secondary | ICD-10-CM | POA: Diagnosis not present

## 2015-05-26 DIAGNOSIS — Z9842 Cataract extraction status, left eye: Secondary | ICD-10-CM | POA: Diagnosis not present

## 2015-05-26 DIAGNOSIS — Z79899 Other long term (current) drug therapy: Secondary | ICD-10-CM | POA: Diagnosis not present

## 2015-05-26 DIAGNOSIS — Z9889 Other specified postprocedural states: Secondary | ICD-10-CM | POA: Insufficient documentation

## 2015-05-26 DIAGNOSIS — Z86010 Personal history of colonic polyps: Secondary | ICD-10-CM | POA: Diagnosis not present

## 2015-05-26 DIAGNOSIS — Z961 Presence of intraocular lens: Secondary | ICD-10-CM | POA: Diagnosis not present

## 2015-05-26 DIAGNOSIS — N183 Chronic kidney disease, stage 3 (moderate): Secondary | ICD-10-CM | POA: Insufficient documentation

## 2015-05-26 DIAGNOSIS — M810 Age-related osteoporosis without current pathological fracture: Secondary | ICD-10-CM | POA: Insufficient documentation

## 2015-05-26 DIAGNOSIS — K5730 Diverticulosis of large intestine without perforation or abscess without bleeding: Secondary | ICD-10-CM | POA: Diagnosis not present

## 2015-05-26 DIAGNOSIS — Z9841 Cataract extraction status, right eye: Secondary | ICD-10-CM | POA: Insufficient documentation

## 2015-05-26 DIAGNOSIS — E785 Hyperlipidemia, unspecified: Secondary | ICD-10-CM | POA: Diagnosis not present

## 2015-05-26 DIAGNOSIS — D125 Benign neoplasm of sigmoid colon: Secondary | ICD-10-CM | POA: Insufficient documentation

## 2015-05-26 DIAGNOSIS — R195 Other fecal abnormalities: Secondary | ICD-10-CM | POA: Diagnosis present

## 2015-05-26 HISTORY — PX: COLONOSCOPY WITH PROPOFOL: SHX5780

## 2015-05-26 LAB — HM COLONOSCOPY

## 2015-05-26 SURGERY — COLONOSCOPY WITH PROPOFOL
Anesthesia: General

## 2015-05-26 MED ORDER — METOPROLOL SUCCINATE ER 25 MG PO TB24
ORAL_TABLET | ORAL | Status: AC
  Administered 2015-05-26: 25 mg via ORAL
  Filled 2015-05-26: qty 1

## 2015-05-26 MED ORDER — MIDAZOLAM HCL 2 MG/2ML IJ SOLN
INTRAMUSCULAR | Status: DC | PRN
  Administered 2015-05-26: 1 mg via INTRAVENOUS
  Administered 2015-05-26: 50 mg via INTRAVENOUS

## 2015-05-26 MED ORDER — LIDOCAINE HCL (CARDIAC) 20 MG/ML IV SOLN
INTRAVENOUS | Status: DC | PRN
  Administered 2015-05-26: 40 mg via INTRATRACHEAL

## 2015-05-26 MED ORDER — SODIUM CHLORIDE 0.9 % IV SOLN
INTRAVENOUS | Status: DC
  Administered 2015-05-26: 15:00:00 via INTRAVENOUS

## 2015-05-26 MED ORDER — METOPROLOL SUCCINATE ER 25 MG PO TB24
25.0000 mg | ORAL_TABLET | Freq: Every day | ORAL | Status: DC
  Administered 2015-05-26: 25 mg via ORAL

## 2015-05-26 MED ORDER — PROPOFOL 10 MG/ML IV BOLUS
INTRAVENOUS | Status: DC | PRN
  Administered 2015-05-26 (×3): 10 mg via INTRAVENOUS
  Administered 2015-05-26: 20 mg via INTRAVENOUS

## 2015-05-26 MED ORDER — PROPOFOL 500 MG/50ML IV EMUL
INTRAVENOUS | Status: DC | PRN
  Administered 2015-05-26: 120 ug/kg/min via INTRAVENOUS

## 2015-05-26 NOTE — Anesthesia Procedure Notes (Signed)
Date/Time: 05/26/2015 3:00 PM Performed by: Johnna Acosta Pre-anesthesia Checklist: Patient identified, Emergency Drugs available, Suction available, Patient being monitored and Timeout performed Patient Re-evaluated:Patient Re-evaluated prior to inductionOxygen Delivery Method: Nasal cannula

## 2015-05-26 NOTE — Anesthesia Postprocedure Evaluation (Signed)
Anesthesia Post Note  Patient: Christie Stevenson  Procedure(s) Performed: Procedure(s) (LRB): COLONOSCOPY WITH PROPOFOL (N/A)  Patient location during evaluation: PACU Anesthesia Type: General Level of consciousness: awake Pain management: pain level controlled Vital Signs Assessment: post-procedure vital signs reviewed and stable Respiratory status: spontaneous breathing Cardiovascular status: blood pressure returned to baseline Anesthetic complications: no    Last Vitals:  Filed Vitals:   05/26/15 1539 05/26/15 1548  BP: 112/71 128/72  Pulse: 73 77  Temp: 35.9 C   Resp: 16 15    Last Pain: There were no vitals filed for this visit.               VAN STAVEREN,Buckley Bradly

## 2015-05-26 NOTE — Transfer of Care (Signed)
Immediate Anesthesia Transfer of Care Note  Patient: Christie Stevenson  Procedure(s) Performed: Procedure(s): COLONOSCOPY WITH PROPOFOL (N/A)  Patient Location: PACU  Anesthesia Type:General  Level of Consciousness: sedated  Airway & Oxygen Therapy: Patient Spontanous Breathing and Patient connected to nasal cannula oxygen  Post-op Assessment: Report given to RN and Post -op Vital signs reviewed and stable  Post vital signs: Reviewed and stable  Last Vitals:  Filed Vitals:   05/26/15 1433 05/26/15 1538  BP: 135/78 112/71  Pulse: 100 71  Temp: 37.6 C 35.9 C  Resp: 18 16    Last Pain: There were no vitals filed for this visit.       Complications: No apparent anesthesia complications

## 2015-05-26 NOTE — Op Note (Signed)
[facility] Patient Name: Christie Stevenson Procedure Date: 05/26/2015 2:56 PM MRN: [id number] Account #: [id number] Date of Birth: 1940-08-05 Admit Type: Outpatient Age: 75 Room: [facility] ENDO ROOM 1 Gender: Female Note Status: Finalized Procedure:            Colonoscopy Indications:          Positive Cologuard test Providers:            [name], MD Referring MD:         Velora Heckler. Doss (Referring MD) Medicines:            Propofol per Anesthesia Complications:        No immediate complications. Procedure:            Pre-Anesthesia Assessment:                       - After reviewing the risks and benefits, the patient                        was deemed in satisfactory condition to undergo the                        procedure.                       After obtaining informed consent, the colonoscope was                        passed under direct vision. Throughout the procedure,                        the patient's blood pressure, pulse, and oxygen                        saturations were monitored continuously. The                        Colonoscope was introduced through the anus and                        advanced to the the cecum, identified by appendiceal                        orifice and ileocecal valve. The colonoscopy was                        performed without difficulty. The patient tolerated the                        procedure well. The quality of the bowel preparation                        was excellent. Findings:      A 10 mm polyp was found in the distal sigmoid colon. The polyp was       semi-pedunculated. The polyp was removed with a hot snare. Resection and       retrieval were complete. To prevent bleeding after the polypectomy, one       hemostatic clip was successfully placed. There was no bleeding during,       or at the end, of the procedure.  A diminutive polyp was found in the ascending colon. The polyp was   sessile. The polyp was removed with a jumbo cold forceps. Resection and       retrieval were complete.      Two small angioectasias without bleeding were found in the cecum.      Multiple small-mouthed diverticula were found in the sigmoid colon,       descending colon and transverse colon.      The exam was otherwise without abnormality. Impression:           - One 10 mm polyp in the distal sigmoid colon, removed                        with a hot snare. Resected and retrieved. Clip was                        placed.                       - One diminutive polyp in the ascending colon, removed                        with a jumbo cold forceps. Resected and retrieved.                       - Two non-bleeding colonic angioectasias.                       - Diverticulosis in the sigmoid colon, in the                        descending colon and in the transverse colon.                       - The examination was otherwise normal. Recommendation:       - Await pathology results. [name], MD 05/26/2015 3:36:58 PM This report has been signed electronically. Number of Addenda: 0 Note Initiated On: 05/26/2015 2:56 PM Scope Withdrawal Time: 0 hours 26 minutes 0 seconds  Total Procedure Duration: 0 hours 30 minutes 9 seconds       [facility]

## 2015-05-26 NOTE — Anesthesia Preprocedure Evaluation (Addendum)
Anesthesia Evaluation  Patient identified by MRN, date of birth, ID band Patient awake    Reviewed: Allergy & Precautions, H&P , NPO status , Patient's Chart, lab work & pertinent test results  History of Anesthesia Complications Negative for: history of anesthetic complications  Airway Mallampati: III  TM Distance: >3 FB Neck ROM: limited    Dental  (+) Poor Dentition   Pulmonary neg shortness of breath, former smoker,    Pulmonary exam normal breath sounds clear to auscultation       Cardiovascular Exercise Tolerance: Good hypertension, (-) angina+ Peripheral Vascular Disease  (-) Past MI and (-) DOE Normal cardiovascular exam+ dysrhythmias  Rhythm:regular Rate:Normal     Neuro/Psych  Headaches, PSYCHIATRIC DISORDERS Depression    GI/Hepatic Neg liver ROS, GERD  Controlled and Medicated,  Endo/Other  negative endocrine ROS  Renal/GU Renal disease  negative genitourinary   Musculoskeletal   Abdominal   Peds  Hematology negative hematology ROS (+)   Anesthesia Other Findings Past Medical History:   History of diverticulitis                                    GERD (gastroesophageal reflux disease)                       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*              Hyperlipidemia                                               Hypertension                                                 Migraine                                                     History of kidney stones                                     History of colon polyps                                      Osteoporosis                                                 Depression                                                   Chronic kidney disease  Comment:STAGE 3  Past Surgical History:   CATARACT EXTRACTION                             Left 2014         TUBAL LIGATION                                                 COLONOSCOPY                                                   CATARACT EXTRACTION W/PHACO                     Right 04/01/2015      Comment:Procedure: CATARACT EXTRACTION PHACO AND               INTRAOCULAR LENS PLACEMENT (IOC);  Surgeon:               [name], MD;  Location: [facility] ORS;                Service: Ophthalmology;  Laterality: Right;  [place]              00:47 AP% 20.8 CDE 9.76 fluid pack lot #               [id number]     Reproductive/Obstetrics negative OB ROS                            Anesthesia Physical Anesthesia Plan  ASA: III  Anesthesia Plan: General   Post-op Pain Management:    Induction:   Airway Management Planned:   Additional Equipment:   Intra-op Plan:   Post-operative Plan:   Informed Consent: I have reviewed the patients History and Physical, chart, labs and discussed the procedure including the risks, benefits and alternatives for the proposed anesthesia with the patient or authorized representative who has indicated his/her understanding and acceptance.   Dental Advisory Given  Plan Discussed with: Anesthesiologist, CRNA and Surgeon  Anesthesia Plan Comments:         Anesthesia Quick Evaluation

## 2015-05-27 ENCOUNTER — Encounter: Payer: Self-pay | Admitting: Unknown Physician Specialty

## 2015-05-28 LAB — SURGICAL PATHOLOGY

## 2015-05-31 ENCOUNTER — Other Ambulatory Visit: Payer: Self-pay | Admitting: Nurse Practitioner

## 2015-06-02 NOTE — Telephone Encounter (Signed)
Celexa refill request, formerly Doss patient.

## 2015-06-06 ENCOUNTER — Encounter: Payer: Self-pay | Admitting: Nurse Practitioner

## 2015-09-08 ENCOUNTER — Other Ambulatory Visit: Payer: Self-pay

## 2015-09-08 MED ORDER — CITALOPRAM HYDROBROMIDE 20 MG PO TABS: 20.0000 mg | ORAL_TABLET | Freq: Every day | ORAL | 2 refills | Status: DC

## 2015-09-08 NOTE — Telephone Encounter (Signed)
Medication has been refilled.

## 2015-09-17 ENCOUNTER — Other Ambulatory Visit: Payer: Self-pay | Admitting: Internal Medicine

## 2015-11-22 ENCOUNTER — Other Ambulatory Visit: Payer: Self-pay | Admitting: Nurse Practitioner

## 2015-12-20 ENCOUNTER — Other Ambulatory Visit: Payer: Self-pay | Admitting: Family Medicine

## 2016-01-04 ENCOUNTER — Other Ambulatory Visit: Payer: Self-pay | Admitting: Nurse Practitioner

## 2016-01-26 ENCOUNTER — Other Ambulatory Visit: Payer: Self-pay | Admitting: Family Medicine

## 2016-01-26 MED ORDER — TRIAMTERENE-HCTZ 37.5-25 MG PO TABS: 1.0000 | ORAL_TABLET | Freq: Every day | ORAL | 0 refills | Status: DC

## 2016-02-02 ENCOUNTER — Ambulatory Visit (INDEPENDENT_AMBULATORY_CARE_PROVIDER_SITE_OTHER): Payer: Medicare Other | Admitting: Vascular Surgery

## 2016-02-02 ENCOUNTER — Other Ambulatory Visit (INDEPENDENT_AMBULATORY_CARE_PROVIDER_SITE_OTHER): Payer: Self-pay | Admitting: Vascular Surgery

## 2016-02-02 ENCOUNTER — Ambulatory Visit (INDEPENDENT_AMBULATORY_CARE_PROVIDER_SITE_OTHER): Payer: Medicare Other

## 2016-02-02 ENCOUNTER — Encounter (INDEPENDENT_AMBULATORY_CARE_PROVIDER_SITE_OTHER): Payer: Self-pay | Admitting: Vascular Surgery

## 2016-02-02 VITALS — BP 136/70 | HR 67 | Resp 16 | Ht 60.0 in | Wt 160.0 lb

## 2016-02-02 DIAGNOSIS — I6529 Occlusion and stenosis of unspecified carotid artery: Secondary | ICD-10-CM | POA: Diagnosis not present

## 2016-02-02 DIAGNOSIS — E782 Mixed hyperlipidemia: Secondary | ICD-10-CM

## 2016-02-02 DIAGNOSIS — I10 Essential (primary) hypertension: Secondary | ICD-10-CM

## 2016-02-02 DIAGNOSIS — I6521 Occlusion and stenosis of right carotid artery: Secondary | ICD-10-CM | POA: Diagnosis not present

## 2016-02-02 NOTE — Progress Notes (Signed)
MRN : [id number]  Christie Stevenson is a 76 y.o. (10-31-1940) female who presents with chief complaint of  Chief Complaint  Patient presents with  . Carotid  .  History of Present Illness: The patient is seen for follow up evaluation of carotid stenosis. The carotid stenosis followed by ultrasound.   The patient denies amaurosis fugax. There is no recent history of TIA symptoms or focal motor deficits. There is no prior documented CVA.  The patient is taking enteric-coated aspirin 81 mg daily.  There is no history of migraine headaches. There is no history of seizures.  The patient has a history of coronary artery disease, no recent episodes of angina or shortness of breath. The patient denies PAD or claudication symptoms. There is a history of hyperlipidemia which is being treated with a statin.    Carotid Duplex done today shows RICA 123456 and LICA [id number].  No change compared to last study in no change from 01/14/2015.  Current Meds  Medication Sig  . atorvastatin (LIPITOR) 20 MG tablet TAKE 1 TABLET (20 MG TOTAL) BY MOUTH DAILY.  . Calcium Carb-Cholecalciferol (CALCIUM 600 + D PO) Take 600 mg by mouth 2 (two) times daily.  . citalopram (CELEXA) 20 MG tablet TAKE 1 TABLET (20 MG TOTAL) BY MOUTH DAILY.  Marland Kitchen latanoprost (XALATAN) 0.005 % ophthalmic solution Place 1 drop into the right eye at bedtime.  . metoprolol succinate (TOPROL-XL) 25 MG 24 hr tablet Take 1 tablet (25 mg total) by mouth daily.  . metoprolol succinate (TOPROL-XL) 25 MG 24 hr tablet Take 1 tablet (25 mg total) by mouth daily. Must keep appt with Dr. Lacinda Axon in Feb for further refills.  . triamterene-hydrochlorothiazide (MAXZIDE-25) 37.5-25 MG tablet Take 1 tablet by mouth daily.    Past Medical History:  Diagnosis Date  . 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ease   . Chronic kidney disease    STAGE 3  . Depression   . GERD (gastroesophageal reflux disease)   . History of colon polyps   . History of  diverticulitis   . History of kidney stones   . Hyperlipidemia   . Hypertension   . Migraine   . Osteoporosis     Past Surgical History:  Procedure Laterality Date  . CATARACT EXTRACTION Left 2014  . CATARACT EXTRACTION W/PHACO Right 04/01/2015   Procedure: CATARACT EXTRACTION PHACO AND INTRAOCULAR LENS PLACEMENT (IOC);  Surgeon: [name], MD;  Location: [facility] ORS;  Service: Ophthalmology;  Laterality: Right;  [place] 00:47 AP% 20.8 CDE 9.76 fluid pack lot # [id number]  . COLONOSCOPY    . COLONOSCOPY WITH PROPOFOL N/A 05/26/2015   Procedure: COLONOSCOPY WITH PROPOFOL;  Surgeon: [name], MD;  Location: [facility] ENDOSCOPY;  Service: Endoscopy;  Laterality: N/A;  . TUBAL LIGATION      Social History Social History  Substance Use Topics  . Smoking status: Former Smoker    Quit date: 07/21/1988  . Smokeless tobacco: Never Used  . Alcohol use Yes     Comment: occasional drink    Family History Family History  Problem Relation Age of Onset  . Arthritis Mother   . Hypertension Mother   . Osteoporosis Mother   . Osteoporosis Sister   . Osteoporosis Brother   . Diabetes Daughter   . Diabetes Son   . Cancer Paternal Uncle     Prostate Cancer  . Cancer Sister     Breast Cancer   . Osteoporosis Sister   . Osteoporosis  Brother   . Diabetes Daughter   No family history of bleeding/clotting disorders, porphyria or autoimmune disease   No Known Allergies   REVIEW OF SYSTEMS (Negative unless checked)  Constitutional: [] Weight loss  [] Fever  [] Chills Cardiac: [] Chest pain   [] Chest pressure   [] Palpitations   [] Shortness of breath when laying flat   [] Shortness of breath with exertion. Vascular:  [] Pain in legs with walking   [] Pain in legs at rest  [] History of DVT   [] Phlebitis   [] Swelling in legs   [] Varicose veins   [] Non-healing ulcers Pulmonary:   [] Uses home oxygen   [] Productive cough   [] Hemoptysis   [] Wheeze  [] COPD   [] Asthma Neurologic:  [] Dizziness   [] Seizures    [] History of stroke   [] History of TIA  [] Aphasia   [] Vissual changes   [] Weakness or numbness in arm   [] Weakness or numbness in leg Musculoskeletal:   [] Joint swelling   [] Joint pain   [] Low back pain Hematologic:  [] Easy bruising  [] Easy bleeding   [] Hypercoagulable state   [] Anemic Gastrointestinal:  [] Diarrhea   [] Vomiting  [] Gastroesophageal reflux/heartburn   [] Difficulty swallowing. Genitourinary:  [] Chronic kidney disease   [] Difficult urination  [] Frequent urination   [] Blood in urine Skin:  [] Rashes   [] Ulcers  Psychological:  [] History of anxiety   []  History of major depression.  Physical Examination  Vitals:   02/02/16 1345  BP: 136/70  Pulse: 67  Resp: 16  Weight: 160 lb (72.6 kg)  Height: 5' (1.524 m)   Body mass index is 31.25 kg/m. Gen: WD/WN, NAD Head: [place]/AT, No temporalis wasting.  Ear/Nose/Throat: Hearing grossly intact, nares w/o erythema or drainage, poor dentition Eyes: PER, EOMI, sclera nonicteric.  Neck: Supple, no masses.  No bruit or JVD.  Pulmonary:  Good air movement, clear to auscultation bilaterally, no use of accessory muscles.  Cardiac: RRR, normal S1, S2, no Murmurs. Vascular:  Right carotid bruit Vessel Right Left  Radial Palpable Palpable  Ulnar Palpable Palpable  Brachial Palpable Palpable  Carotid Palpable Palpable  Femoral Palpable Palpable  Popliteal Palpable Palpable  PT Palpable Palpable  DP Palpable Palpable   Gastrointestinal: soft, non-distended. No guarding/no peritoneal signs.  Musculoskeletal: M/S 5/5 throughout.  No deformity or atrophy.  Neurologic: CN 2-12 intact. Pain and light touch intact in extremities.  Symmetrical.  Speech is fluent. Motor exam as listed above. Psychiatric: Judgment intact, Mood & affect appropriate for pt's clinical situation. Dermatologic: No rashes or ulcers noted.  No changes consistent with cellulitis. Lymph : No Cervical lymphadenopathy, no lichenification or skin changes of chronic  lymphedema.  CBC Lab Results  Component Value Date   WBC 9.4 02/19/2015   HGB 13.1 02/19/2015   HCT 41.1 02/19/2015   MCV 83.9 02/19/2015   PLT 279.0 02/19/2015    BMET    Component Value Date/Time   NA 138 02/19/2015 1146   K 3.7 02/19/2015 1146   CL 101 02/19/2015 1146   CO2 28 02/19/2015 1146   GLUCOSE 88 02/19/2015 1146   BUN 16 02/19/2015 1146   CREATININE 1.09 02/19/2015 1146   CALCIUM 10.2 02/19/2015 1146   CrCl cannot be calculated (Patient's most recent lab result is older than the maximum 21 days allowed.).  COAG No results found for: INR, PROTIME  Radiology No results found.   Assessment/Plan 1. Stenosis of right carotid artery Recommend:  Given the patient's asymptomatic subcritical stenosis no further invasive testing or surgery at this time.  Duplex ultrasound shows <  60% stenosis bilaterally.  Continue antiplatelet therapy as prescribed Continue management of CAD, HTN and Hyperlipidemia Healthy heart diet,  encouraged exercise at least 4 times per week Follow up in 12 months with duplex ultrasound and physical exam based on the stabel <59% stenosis of the right carotid artery   - VAS US CAROTID; Future  2. Essential hypertension Continue antihypertensive medications as already ordered, these medications have been reviewed and there are no changes at this time.  3. Mixed hyperlipidemia Continue statin as ordered and reviewed, no changes at this time     Hortencia Pilar, MD  02/02/2016 2:19 PM

## 2016-02-17 ENCOUNTER — Other Ambulatory Visit: Payer: Self-pay | Admitting: Nurse Practitioner

## 2016-02-20 ENCOUNTER — Ambulatory Visit (INDEPENDENT_AMBULATORY_CARE_PROVIDER_SITE_OTHER): Payer: Medicare Other

## 2016-02-20 ENCOUNTER — Ambulatory Visit: Payer: Medicare Other

## 2016-02-20 VITALS — BP 122/64 | HR 60 | Temp 98.2°F | Resp 12 | Ht 60.0 in | Wt 158.8 lb

## 2016-02-20 DIAGNOSIS — Z0000 Encounter for general adult medical examination without abnormal findings: Secondary | ICD-10-CM

## 2016-02-20 NOTE — Patient Instructions (Addendum)
  Christie Stevenson , Thank you for taking time to come for your Medicare Wellness Visit. I appreciate your ongoing commitment to your health goals. Please review the following plan we discussed and let me know if I can assist you in the future.   Follow up with [name] FNP, as needed.  These are the goals we discussed: Goals    . Healthy Lifestyle          Choose lean meats (chicken, [place], fish), fruits and vegetables when eating Stay hydrated! Drink plenty of fluids/water.    . Increase physical activity          Weekly exercises (Tai-Chi) at the [facility] if available.  Begin walking 30 minutes a day, 3 times a week.       This is a list of the screening recommended for you and due dates:  Health Maintenance  Topic Date Due  . Tetanus Vaccine  07/24/1960  . Mammogram  02/23/2017  . Colon Cancer Screening  05/25/2025  . Flu Shot  Completed  . DEXA scan (bone density measurement)  Completed  . Shingles Vaccine  Addressed  . Pneumonia vaccines  Completed

## 2016-02-20 NOTE — Progress Notes (Signed)
Subjective:   Christie Stevenson is a 76 y.o. female who presents for Medicare Annual (Subsequent) preventive examination.  Review of Systems:  No ROS.  Medicare Wellness Visit.  Cardiac Risk Factors include: advanced age (>91mn, >>28women);hypertension;obesity (BMI >30kg/m2)     Objective:     Vitals: BP 122/64 (BP Location: Left Arm, Patient Position: Sitting, Cuff Size: Normal)   Pulse 60   Temp 98.2 F (36.8 C) (Oral)   Resp 12   Ht 5' (1.524 m)   Wt 158 lb 12.8 oz (72 kg)   SpO2 95%   BMI 31.01 kg/m   Body mass index is 31.01 kg/m.   Tobacco History  Smoking Status  . Former Smoker  . Quit date: 07/21/1988  Smokeless Tobacco  . Never Used     Counseling given: Not Answered   Past Medical History:  Diagnosis Date  . 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ease   . Chronic kidney disease    STAGE 3  . Depression   . GERD (gastroesophageal reflux disease)   . History of colon polyps   . History of diverticulitis   . History of kidney stones   . Hyperlipidemia   . Hypertension   . Migraine   . Osteoporosis    Past Surgical History:  Procedure Laterality Date  . CATARACT EXTRACTION Left 2014  . CATARACT EXTRACTION W/PHACO Right 04/01/2015   Procedure: CATARACT EXTRACTION PHACO AND INTRAOCULAR LENS PLACEMENT (IOC);  Surgeon: WBirder Robson MD;  Location: [facility] ORS;  Service: Ophthalmology;  Laterality: Right;  [place]:47 AP% 20.8 CDE 9.76 fluid pack lot # [id number]  . COLONOSCOPY    . COLONOSCOPY WITH PROPOFOL N/A 05/26/2015   Procedure: COLONOSCOPY WITH PROPOFOL;  Surgeon: RManya Silvas MD;  [facility]  Service: Endoscopy;  Laterality: N/A;  . TUBAL LIGATION     Family History  Problem Relation Age of Onset  . Arthritis Mother   . Hypertension Mother   . Osteoporosis Mother   . Osteoporosis Sister   . Osteoporosis Brother   . Heart disease Brother   . Diabetes Daughter   . Diabetes Son   . Cancer Paternal Uncle    Prostate Cancer  . Cancer Sister     Breast Cancer   . Osteoporosis Sister   . Osteoporosis Brother   . Heart disease Brother   . Diabetes Daughter    History  Sexual Activity  . Sexual activity: No    Outpatient Encounter Prescriptions as of 02/20/2016  Medication Sig  . atorvastatin (LIPITOR) 20 MG tablet TAKE 1 TABLET (20 MG TOTAL) BY MOUTH DAILY.  . Calcium Carb-Cholecalciferol (CALCIUM 600 + D PO) Take 600 mg by mouth 2 (two) times daily.  .Marland Kitchenlatanoprost (XALATAN) 0.005 % ophthalmic solution Place 1 drop into the right eye at bedtime.  . metoprolol succinate (TOPROL-XL) 25 MG 24 hr tablet Take 1 tablet (25 mg total) by mouth daily.  .Marland Kitchentriamterene-hydrochlorothiazide (MAXZIDE-25) 37.5-25 MG tablet Take 1 tablet by mouth daily.  . [DISCONTINUED] metoprolol succinate (TOPROL-XL) 25 MG 24 hr tablet Take 1 tablet (25 mg total) by mouth daily. Must keep appt with Dr. CLacinda Axonin Feb for further refills.  . citalopram (CELEXA) 20 MG tablet TAKE 1 TABLET (20 MG TOTAL) BY MOUTH DAILY. (Patient not taking: Reported on 02/20/2016)   No facility-administered encounter medications on file as of 02/20/2016.     Activities of Daily Living In your present state of health, do you have any  difficulty performing the following activities: 02/20/2016  Hearing? N  Vision? N  Difficulty concentrating or making decisions? N  Walking or climbing stairs? N  Dressing or bathing? N  Doing errands, shopping? N  Preparing Food and eating ? N  Using the Toilet? N  In the past six months, have you accidently leaked urine? N  Do you have problems with loss of bowel control? N  Managing your Medications? N  Managing your Finances? N  Housekeeping or managing your Housekeeping? N  Some recent data might be hidden    Patient Care Team: Burnard Hawthorne, FNP as PCP - General (Family Medicine)    Assessment:    This is a routine wellness examination for [facility]. The goal of the wellness visit is to assist the  patient how to close the gaps in care and create a preventative care plan for the patient.   Taking calcium VIT D as appropriate/Osteoporosis reviewed.  Medications reviewed; taking without issues or barriers.  Safety issues reviewed; smoke detectors in the home. No firearms in the home. Wears seatbelts when driving or riding with others. No violence in the home.  No identified risk were noted; The patient was oriented x 3; appropriate in dress and manner and no objective failures at ADL's or IADL's.   BMI; discussed the importance of a healthy diet, water intake and exercise. Educational material provided.  HTN; followed by PCP.  TDAP vaccine deferred for follow up with her insurance per patient preference.  Educational material provided.  Patient Concerns: None at this time. Follow up with PCP as needed.  Exercise Activities and Dietary recommendations Current Exercise Habits: Home exercise routine, Type of exercise: walking, Frequency (Times/Week): 2, Intensity: Mild  Goals    . Healthy Lifestyle          Choose lean meats (chicken, [place], fish), fruits and vegetables when eating Stay hydrated! Drink plenty of fluids/water.    . Increase physical activity          Weekly exercises (Tai-Chi) at the [facility] if available.  Begin walking 30 minutes a day, 3 times a week.      Fall Risk Fall Risk  02/20/2016 02/19/2015 01/03/2014  Falls in the past year? No No No   Depression Screen PHQ 2/9 Scores 02/20/2016 02/19/2015 01/03/2014  PHQ - 2 Score 0 0 0     Cognitive Function MMSE - Mini Mental State Exam 02/19/2015  Orientation to time 5  Orientation to Place 5  Registration 3  Attention/ Calculation 5  Recall 3  Language- name 2 objects 2  Language- repeat 1  Language- follow 3 step command 3  Language- read & follow direction 1  Write a sentence 1  Copy design 1  Total score 30        Immunization History  Administered Date(s) Administered  .  Influenza Split 10/19/2013  . Influenza-Unspecified 12/02/2012, 10/15/2014, 10/19/2015  . Pneumococcal Conjugate-13 12/02/2012  . Pneumococcal Polysaccharide-23 03/17/2015   Screening Tests Health Maintenance  Topic Date Due  . TETANUS/TDAP  07/24/1960  . MAMMOGRAM  02/23/2017  . COLONOSCOPY  05/25/2025  . INFLUENZA VACCINE  Completed  . DEXA SCAN  Completed  . ZOSTAVAX  Addressed  . PNA vac Low Risk Adult  Completed      Plan:    End of life planning; Advance aging; Advanced directives discussed. No HCPOA/Living Will.  Additional information declined at this time.    Medicare Attestation I have personally reviewed: The patient's  medical and social history Their use of alcohol, tobacco or illicit drugs Their current medications and supplements The patient's functional ability including ADLs,fall risks, home safety risks, cognitive, and hearing and visual impairment Diet and physical activities Evidence for depression   The patient's weight, height, BMI, and visual acuity have been recorded in the chart.  I have made referrals and provided education to the patient based on review of the above and I have provided the patient with a written personalized care plan for preventive services.    During the course of the visit the patient was educated and counseled about the following appropriate screening and preventive services:   Vaccines to include Pneumoccal, Influenza, Hepatitis B, Td, Zostavax, HCV  Electrocardiogram  Cardiovascular Disease  Colorectal cancer screening  Bone density screening  Diabetes screening  Glaucoma screening  Mammography/PAP  Nutrition counseling   Patient Instructions (the written plan) was given to the patient.   Varney Biles, LPN  09/20/8099

## 2016-02-20 NOTE — Progress Notes (Signed)
Care was provided under my supervision. I agree with the management as indicated in the note.  Ceylon Arenson DO  

## 2016-03-06 ENCOUNTER — Other Ambulatory Visit: Payer: Self-pay | Admitting: Family Medicine

## 2016-03-08 NOTE — Telephone Encounter (Signed)
seen 2016. Pt has future appt on 03/2016. Please advise your pt.

## 2016-03-08 NOTE — Telephone Encounter (Signed)
courtesy 30 day supply until seen

## 2016-03-16 ENCOUNTER — Other Ambulatory Visit: Payer: Self-pay

## 2016-03-16 NOTE — Telephone Encounter (Signed)
Pt would like this refilled with a 90 day supply.  Refilled on 12/25/2013 with 5 refills. Last Ov: 03/17/2015 Lorane Gell, NP Next Ov: 03/22/2016  Please advise.

## 2016-03-17 ENCOUNTER — Encounter: Payer: Medicare Other | Admitting: Nurse Practitioner

## 2016-03-17 ENCOUNTER — Encounter: Payer: Medicare Other | Admitting: Family Medicine

## 2016-03-17 MED ORDER — METOPROLOL SUCCINATE ER 25 MG PO TB24
25.0000 mg | ORAL_TABLET | Freq: Every day | ORAL | 1 refills | Status: DC
Start: 2016-03-17 — End: 2016-06-24

## 2016-03-18 NOTE — Telephone Encounter (Signed)
Spoke with pt and informed her that the medication was sent in as a 90 day supply. Also made sure that the pt was aware of her appt on 03/22/2016 @ 2:00pm and she stated that she would be here.

## 2016-03-22 ENCOUNTER — Ambulatory Visit (INDEPENDENT_AMBULATORY_CARE_PROVIDER_SITE_OTHER): Payer: Medicare Other | Admitting: Family

## 2016-03-22 ENCOUNTER — Encounter: Payer: Self-pay | Admitting: Family

## 2016-03-22 VITALS — BP 120/74 | HR 69 | Temp 97.9°F | Wt 165.0 lb

## 2016-03-22 DIAGNOSIS — I10 Essential (primary) hypertension: Secondary | ICD-10-CM | POA: Diagnosis not present

## 2016-03-22 DIAGNOSIS — Z0001 Encounter for general adult medical examination with abnormal findings: Secondary | ICD-10-CM

## 2016-03-22 DIAGNOSIS — Z0000 Encounter for general adult medical examination without abnormal findings: Secondary | ICD-10-CM

## 2016-03-22 DIAGNOSIS — E782 Mixed hyperlipidemia: Secondary | ICD-10-CM | POA: Diagnosis not present

## 2016-03-22 DIAGNOSIS — M810 Age-related osteoporosis without current pathological fracture: Secondary | ICD-10-CM | POA: Diagnosis not present

## 2016-03-22 DIAGNOSIS — Z87898 Personal history of other specified conditions: Secondary | ICD-10-CM | POA: Insufficient documentation

## 2016-03-22 DIAGNOSIS — K219 Gastro-esophageal reflux disease without esophagitis: Secondary | ICD-10-CM | POA: Insufficient documentation

## 2016-03-22 MED ORDER — ATORVASTATIN CALCIUM 20 MG PO TABS: ORAL_TABLET | ORAL | 1 refills | Status: DC

## 2016-03-22 MED ORDER — OMEPRAZOLE 20 MG PO CPDR: 20.0000 mg | DELAYED_RELEASE_CAPSULE | Freq: Every day | ORAL | 0 refills | Status: DC

## 2016-03-22 NOTE — Progress Notes (Signed)
Subjective:    Patient ID: Christie Stevenson, female    DOB: [birth year], 76 y.o.   MRN: [id number]  CC: Christie Stevenson is a 76 y.o. female who presents today for physical exam.    HPI: Osteoporosis- had been on fosamax 10 years ago. Has a lot of dental pain and work done from time to time.   GERD- has 'bad' acid reflux. Tastes acid in throat. Occurs only when lays down on right side after eating a meal. Tried tums with no relief. Tried prilosec years ago and didn't work.  No CP, SOB, adbominal pain, trouble swallowing, blood in stools.   HTN- compliant with medication, maxide. Denies exertional chest pain or pressure, numbness or tingling radiating to left arm or jaw,  dizziness, frequent headaches, changes in vision, or shortness of breath.    H/o Palpitations- Takes metoprolol for palpitations 5-6 years. No current palpitations.      Colorectal Cancer Screening: positive cologuard and then colonoscopy done by dr Vira Agar, polpys, neg malignancy. 5 year repeat. Breast Cancer Screening: Mammogram UTD Cervical Cancer Screening: Has stopped pap. No vaginal complaints.  Bone Health screening/DEXA for 65+: Osteoporosis, dexa 2017. Strong family history Lung Cancer Screening: 30 year pack year history and age > 22 years . Has quit longer than 15 years.        Tetanus - due        Pneumococcal -utd Labs: Screening labs today. Exercise: Gets regular exercise.  Alcohol use: occasional Smoking/tobacco use: former smoker.  Regular dental exams: UTD Wears seat belt: Yes. Skin: no concerns  HISTORY:  Past Medical History:  Diagnosis Date  . 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ease   . Chronic kidney disease    STAGE 3  . Depression   . GERD (gastroesophageal reflux disease)   . History of colon polyps   . History of diverticulitis   . History of kidney stones   . Hyperlipidemia   . Hypertension   . Migraine   . Osteoporosis     Past Surgical History:  Procedure  Laterality Date  . CATARACT EXTRACTION Left 2014  . CATARACT EXTRACTION W/PHACO Right 04/01/2015   Procedure: CATARACT EXTRACTION PHACO AND INTRAOCULAR LENS PLACEMENT (IOC);  Surgeon: [name], MD;  Location: [facility] ORS;  Service: Ophthalmology;  Laterality: Right;  [place] 00:47 AP% 20.8 CDE 9.76 fluid pack lot # [id number]  . COLONOSCOPY    . COLONOSCOPY WITH PROPOFOL N/A 05/26/2015   Procedure: COLONOSCOPY WITH PROPOFOL;  Surgeon: [name], MD;  Location: [facility];  Service: Endoscopy;  Laterality: N/A;  . TUBAL LIGATION     Family History  Problem Relation Age of Onset  . Arthritis Mother   . Hypertension Mother   . Osteoporosis Mother   . Osteoporosis Sister   . Osteoporosis Brother   . Heart disease Brother   . Diabetes Daughter   . Diabetes Son   . Cancer Paternal Uncle     Prostate Cancer  . Cancer Sister     Breast Cancer   . Osteoporosis Sister   . Osteoporosis Brother   . Heart disease Brother   . Diabetes Daughter       ALLERGIES: Patient has no known allergies.  Current Outpatient Prescriptions on File Prior to Visit  Medication Sig Dispense Refill  . Calcium Carb-Cholecalciferol (CALCIUM 600 + D PO) Take 600 mg by mouth 2 (two) times daily.    Marland Kitchen latanoprost (XALATAN) 0.005 % ophthalmic solution Place 1 drop  into the right eye at bedtime.    . metoprolol succinate (TOPROL-XL) 25 MG 24 hr tablet Take 1 tablet (25 mg total) by mouth daily. 90 tablet 1  . triamterene-hydrochlorothiazide (MAXZIDE-25) 37.5-25 MG tablet Take 1 tablet by mouth daily. 90 tablet 0   No current facility-administered medications on file prior to visit.     Social History  Substance Use Topics  . Smoking status: Former Smoker    Quit date: 07/21/1988  . Smokeless tobacco: Never Used  . Alcohol use Yes     Comment: occasional drink    Review of Systems  Constitutional: Negative for chills, fever and unexpected weight change.  HENT: Negative for congestion and trouble  swallowing.   Respiratory: Negative for cough.   Cardiovascular: Negative for chest pain, palpitations and leg swelling.  Gastrointestinal: Negative for abdominal pain, blood in stool, nausea and vomiting.  Musculoskeletal: Negative for arthralgias and myalgias.  Skin: Negative for rash.  Neurological: Negative for headaches.  Hematological: Negative for adenopathy.  Psychiatric/Behavioral: Negative for confusion.      Objective:    BP 120/74 (BP Location: Left Arm, Patient Position: Sitting, Cuff Size: Large)   Pulse 69   Temp 97.9 F (36.6 C) (Oral)   Wt 165 lb (74.8 kg)   SpO2 98%   BMI 32.22 kg/m   BP Readings from Last 3 Encounters:  03/22/16 120/74  02/20/16 122/64  02/02/16 136/70   Wt Readings from Last 3 Encounters:  03/22/16 165 lb (74.8 kg)  02/20/16 158 lb 12.8 oz (72 kg)  02/02/16 160 lb (72.6 kg)    Physical Exam  Constitutional: She appears well-developed and well-nourished.  Eyes: Conjunctivae are normal.  Neck: No thyroid mass and no thyromegaly present.  Cardiovascular: Normal rate, regular rhythm, normal heart sounds and normal pulses.   Pulmonary/Chest: Effort normal and breath sounds normal. She has no wheezes. She has no rhonchi. She has no rales. Right breast exhibits no inverted nipple, no mass, no nipple discharge, no skin change and no tenderness. Left breast exhibits no inverted nipple, no mass, no nipple discharge, no skin change and no tenderness. Breasts are symmetrical.  CBE performed.   Lymphadenopathy:       Head (right side): No submental, no submandibular, no tonsillar, no preauricular, no posterior auricular and no occipital adenopathy present.       Head (left side): No submental, no submandibular, no tonsillar, no preauricular, no posterior auricular and no occipital adenopathy present.    She has no cervical adenopathy.       Right cervical: No superficial cervical, no deep cervical and no posterior cervical adenopathy present.       Left cervical: No superficial cervical, no deep cervical and no posterior cervical adenopathy present.    She has no axillary adenopathy.  Neurological: She is alert.  Skin: Skin is warm and dry.  Psychiatric: She has a normal mood and affect. Her speech is normal and behavior is normal. Thought content normal.  Vitals reviewed.      Assessment & Plan:   Problem List Items Addressed This Visit      Cardiovascular and Mediastinum   Essential hypertension    Controlled. Continue regimen. Pending cmp      Relevant Medications   atorvastatin (LIPITOR) 20 MG tablet     Digestive   GERD (gastroesophageal reflux disease)    Uncontrolled. Trial omeprazole. F/u one month      Relevant Medications   omeprazole (PRILOSEC) 20 MG capsule  Musculoskeletal and Integument   Osteoporosis    Based on DEXA scan 2017 patient's 10 year risk for any fracture is 21% and hip fracture is 6% meeting criteria for treatment. She unable to tolerate oral biphosphates. Referral to rheumatology for consult as to what other agents patient may be a candidate for.       Relevant Orders   Ambulatory referral to Rheumatology     Other   Hyperlipidemia   Relevant Medications   atorvastatin (LIPITOR) 20 MG tablet   Routine general medical examination at a health care facility - Primary    colonoscopy 2017. UTD mammogram. CME performed. No longer does pap based on age and preference. Advised tdap at local pharmacy. Screening labs today. Encouraged exercise. Doesn't meet CT chest lung screening guidelines.       Relevant Orders   CBC with Differential/Platelet   Comprehensive metabolic panel   Hemoglobin A1c   Lipid panel   TSH   VITAMIN D 25 Hydroxy (Vit-D Deficiency, Fractures)   History of palpitations    None today. Has been on metropolol for years. No recent CV work up. Will continue to monitor.           I have discontinued Ms. Northrop's citalopram. I am also having her start on  omeprazole. Additionally, I am having her maintain her Calcium Carb-Cholecalciferol (CALCIUM 600 + D PO), latanoprost, triamterene-hydrochlorothiazide, metoprolol succinate, and atorvastatin.   Meds ordered this encounter  Medications  . atorvastatin (LIPITOR) 20 MG tablet    Sig: TAKE 1 TABLET (20 MG TOTAL) BY MOUTH DAILY.    Dispense:  90 tablet    Refill:  1    Will need to keep appt with new PCP.    Order Specific Question:   Supervising Provider    Answer:   Crecencio Mc [[id number]]  . omeprazole (PRILOSEC) 20 MG capsule    Sig: Take 1 capsule (20 mg total) by mouth daily.    Dispense:  90 capsule    Refill:  0    Order Specific Question:   Supervising Provider    Answer:   Crecencio Mc [[id number]]    Return precautions given.   Risks, benefits, and alternatives of the medications and treatment plan prescribed today were discussed, and patient expressed understanding.   Education regarding symptom management and diagnosis given to patient on AVS.   Continue to follow with [name], FNP for routine health maintenance.   Christie Stevenson and I agreed with plan.   [name], FNP

## 2016-03-22 NOTE — Patient Instructions (Addendum)
Trial omeprazole  Follow up one month  For post menopausal women, guidelines recommend a diet with 1200 mg of Calcium per day. If you are eating calcium rich foods, you do not need a calcium supplement. The body better absorbs the calcium that you eat over supplementation. If you do supplement, I recommend not supplementing the full 1200 mg/ day as this can lead to increased risk of cardiovascular disease. I recommend Calcium Citrate over the counter, and you may take a total of 600 to 800 mg per day in divided doses with meals for best absorption.   For bone health, you need adequate vitamin D, and I recommend you supplement as it is harder to do so with diet alone. I recommend cholecalciferol 800 units daily.  Also, please ensure you are following a diet high in calcium -- research shows better outcomes with dietary sources including kale, yogurt, broccolii, cheese, okra, almonds- to name a few.     Also remember that exercise is a great medicine for maintain and preserve bone health. Advise moderate exercise for 30 minutes , 3 times per week.      Health Maintenance for Postmenopausal Women Menopause is a normal process in which your reproductive ability comes to an end. This process happens gradually over a span of months to years, usually between the ages of 82 and 16. Menopause is complete when you have missed 12 consecutive menstrual periods. It is important to talk with your health care provider about some of the most common conditions that affect postmenopausal women, such as heart disease, cancer, and bone loss (osteoporosis). Adopting a healthy lifestyle and getting preventive care can help to promote your health and wellness. Those actions can also lower your chances of developing some of these common conditions. What should I know about menopause? During menopause, you may experience a number of symptoms, such as:  Moderate-to-severe hot flashes.  Night sweats.  Decrease in sex  drive.  Mood swings.  Headaches.  Tiredness.  Irritability.  Memory problems.  Insomnia. Choosing to treat or not to treat menopausal changes is an individual decision that you make with your health care provider. What should I know about hormone replacement therapy and supplements? Hormone therapy products are effective for treating symptoms that are associated with menopause, such as hot flashes and night sweats. Hormone replacement carries certain risks, especially as you become older. If you are thinking about using estrogen or estrogen with progestin treatments, discuss the benefits and risks with your health care provider. What should I know about heart disease and stroke? Heart disease, heart attack, and stroke become more likely as you age. This may be due, in part, to the hormonal changes that your body experiences during menopause. These can affect how your body processes dietary fats, triglycerides, and cholesterol. Heart attack and stroke are both medical emergencies. There are many things that you can do to help prevent heart disease and stroke:  Have your blood pressure checked at least every 1-2 years. High blood pressure causes heart disease and increases the risk of stroke.  If you are 59-22 years old, ask your health care provider if you should take aspirin to prevent a heart attack or a stroke.  Do not use any tobacco products, including cigarettes, chewing tobacco, or electronic cigarettes. If you need help quitting, ask your health care provider.  It is important to eat a healthy diet and maintain a healthy weight.  Be sure to include plenty of vegetables, fruits, low-fat dairy  products, and lean protein.  Avoid eating foods that are high in solid fats, added sugars, or salt (sodium).  Get regular exercise. This is one of the most important things that you can do for your health.  Try to exercise for at least 150 minutes each week. The type of exercise that you  do should increase your heart rate and make you sweat. This is known as moderate-intensity exercise.  Try to do strengthening exercises at least twice each week. Do these in addition to the moderate-intensity exercise.  Know your numbers.Ask your health care provider to check your cholesterol and your blood glucose. Continue to have your blood tested as directed by your health care provider. What should I know about cancer screening? There are several types of cancer. Take the following steps to reduce your risk and to catch any cancer development as early as possible. Breast Cancer  Practice breast self-awareness.  This means understanding how your breasts normally appear and feel.  It also means doing regular breast self-exams. Let your health care provider know about any changes, no matter how small.  If you are 33 or older, have a clinician do a breast exam (clinical breast exam or CBE) every year. Depending on your age, family history, and medical history, it may be recommended that you also have a yearly breast X-ray (mammogram).  If you have a family history of breast cancer, talk with your health care provider about genetic screening.  If you are at high risk for breast cancer, talk with your health care provider about having an MRI and a mammogram every year.  Breast cancer (BRCA) gene test is recommended for women who have family members with BRCA-related cancers. Results of the assessment will determine the need for genetic counseling and BRCA1 and for BRCA2 testing. BRCA-related cancers include these types:  Breast. This occurs in males or females.  Ovarian.  Tubal. This may also be called fallopian tube cancer.  Cancer of the abdominal or pelvic lining (peritoneal cancer).  Prostate.  Pancreatic. Cervical, Uterine, and Ovarian Cancer  Your health care provider may recommend that you be screened regularly for cancer of the pelvic organs. These include your ovaries,  uterus, and vagina. This screening involves a pelvic exam, which includes checking for microscopic changes to the surface of your cervix (Pap test).  For women ages 21-65, health care providers may recommend a pelvic exam and a Pap test every three years. For women ages 49-65, they may recommend the Pap test and pelvic exam, combined with testing for human papilloma virus (HPV), every five years. Some types of HPV increase your risk of cervical cancer. Testing for HPV may also be done on women of any age who have unclear Pap test results.  Other health care providers may not recommend any screening for nonpregnant women who are considered low risk for pelvic cancer and have no symptoms. Ask your health care provider if a screening pelvic exam is right for you.  If you have had past treatment for cervical cancer or a condition that could lead to cancer, you need Pap tests and screening for cancer for at least 20 years after your treatment. If Pap tests have been discontinued for you, your risk factors (such as having a new sexual partner) need to be reassessed to determine if you should start having screenings again. Some women have medical problems that increase the chance of getting cervical cancer. In these cases, your health care provider may recommend that you  have screening and Pap tests more often.  If you have a family history of uterine cancer or ovarian cancer, talk with your health care provider about genetic screening.  If you have vaginal bleeding after reaching menopause, tell your health care provider.  There are currently no reliable tests available to screen for ovarian cancer. Lung Cancer  Lung cancer screening is recommended for adults 9-89 years old who are at high risk for lung cancer because of a history of smoking. A yearly low-dose CT scan of the lungs is recommended if you:  Currently smoke.  Have a history of at least 30 pack-years of smoking and you currently smoke or  have quit within the past 15 years. A pack-year is smoking an average of one pack of cigarettes per day for one year. Yearly screening should:  Continue until it has been 15 years since you quit.  Stop if you develop a health problem that would prevent you from having lung cancer treatment. Colorectal Cancer  This type of cancer can be detected and can often be prevented.  Routine colorectal cancer screening usually begins at age 35 and continues through age 33.  If you have risk factors for colon cancer, your health care provider may recommend that you be screened at an earlier age.  If you have a family history of colorectal cancer, talk with your health care provider about genetic screening.  Your health care provider may also recommend using home test kits to check for hidden blood in your stool.  A small camera at the end of a tube can be used to examine your colon directly (sigmoidoscopy or colonoscopy). This is done to check for the earliest forms of colorectal cancer.  Direct examination of the colon should be repeated every 5-10 years until age 72. However, if early forms of precancerous polyps or small growths are found or if you have a family history or genetic risk for colorectal cancer, you may need to be screened more often. Skin Cancer  Check your skin from head to toe regularly.  Monitor any moles. Be sure to tell your health care provider:  About any new moles or changes in moles, especially if there is a change in a mole's shape or color.  If you have a mole that is larger than the size of a pencil eraser.  If any of your family members has a history of skin cancer, especially at a young age, talk with your health care provider about genetic screening.  Always use sunscreen. Apply sunscreen liberally and repeatedly throughout the day.  Whenever you are outside, protect yourself by wearing long sleeves, pants, a wide-brimmed hat, and sunglasses. What should I know  about osteoporosis? Osteoporosis is a condition in which bone destruction happens more quickly than new bone creation. After menopause, you may be at an increased risk for osteoporosis. To help prevent osteoporosis or the bone fractures that can happen because of osteoporosis, the following is recommended:  If you are 49-79 years old, get at least 1,000 mg of calcium and at least 600 mg of vitamin D per day.  If you are older than age 55 but younger than age 57, get at least 1,200 mg of calcium and at least 600 mg of vitamin D per day.  If you are older than age 72, get at least 1,200 mg of calcium and at least 800 mg of vitamin D per day. Smoking and excessive alcohol intake increase the risk of osteoporosis. Eat foods  that are rich in calcium and vitamin D, and do weight-bearing exercises several times each week as directed by your health care provider. What should I know about how menopause affects my mental health? Depression may occur at any age, but it is more common as you become older. Common symptoms of depression include:  Low or sad mood.  Changes in sleep patterns.  Changes in appetite or eating patterns.  Feeling an overall lack of motivation or enjoyment of activities that you previously enjoyed.  Frequent crying spells. Talk with your health care provider if you think that you are experiencing depression. What should I know about immunizations? It is important that you get and maintain your immunizations. These include:  Tetanus, diphtheria, and pertussis (Tdap) booster vaccine.  Influenza every year before the flu season begins.  Pneumonia vaccine.  Shingles vaccine. Your health care provider may also recommend other immunizations. This information is not intended to replace advice given to you by your health care provider. Make sure you discuss any questions you have with your health care provider. Document Released: 02/26/2005 Document Revised: 07/25/2015 Document  Reviewed: 10/08/2014 Elsevier Interactive Patient Education  2017 [employer].

## 2016-03-22 NOTE — Progress Notes (Signed)
Pre visit review using our clinic review tool, if applicable. No additional management support is needed unless otherwise documented below in the visit note. 

## 2016-03-22 NOTE — Assessment & Plan Note (Signed)
None today. Has been on metropolol for years. No recent CV work up. Will continue to monitor.

## 2016-03-22 NOTE — Assessment & Plan Note (Signed)
Based on DEXA scan 2017 patient's 10 year risk for any fracture is 21% and hip fracture is 6% meeting criteria for treatment. She unable to tolerate oral biphosphates. Referral to rheumatology for consult as to what other agents patient may be a candidate for.

## 2016-03-22 NOTE — Assessment & Plan Note (Signed)
Controlled. Continue regimen. Pending cmp

## 2016-03-22 NOTE — Assessment & Plan Note (Signed)
Uncontrolled. Trial omeprazole. F/u one month

## 2016-03-23 NOTE — Assessment & Plan Note (Addendum)
colonoscopy 2017. UTD mammogram. CME performed. No longer does pap based on age and preference. Advised tdap at local pharmacy. Screening labs today. Encouraged exercise. Doesn't meet CT chest lung screening guidelines.

## 2016-04-07 ENCOUNTER — Other Ambulatory Visit (INDEPENDENT_AMBULATORY_CARE_PROVIDER_SITE_OTHER): Payer: Medicare Other

## 2016-04-07 DIAGNOSIS — Z0000 Encounter for general adult medical examination without abnormal findings: Secondary | ICD-10-CM

## 2016-04-07 LAB — CBC WITH DIFFERENTIAL/PLATELET
BASOS PCT: 1.4 % (ref 0.0–3.0)
Basophils Absolute: 0.1 10*3/uL (ref 0.0–0.1)
EOS PCT: 2 % (ref 0.0–5.0)
Eosinophils Absolute: 0.2 10*3/uL (ref 0.0–0.7)
HCT: 40.7 % (ref 36.0–46.0)
Hemoglobin: 13.3 g/dL (ref 12.0–15.0)
LYMPHS ABS: 1.8 10*3/uL (ref 0.7–4.0)
Lymphocytes Relative: 17.4 % (ref 12.0–46.0)
MCHC: 32.7 g/dL (ref 30.0–36.0)
MCV: 86.8 fl (ref 78.0–100.0)
MONO ABS: 0.7 10*3/uL (ref 0.1–1.0)
Monocytes Relative: 6.5 % (ref 3.0–12.0)
NEUTROS PCT: 72.7 % (ref 43.0–77.0)
Neutro Abs: 7.4 10*3/uL (ref 1.4–7.7)
Platelets: 309 10*3/uL (ref 150.0–400.0)
RBC: 4.7 Mil/uL (ref 3.87–5.11)
RDW: 14.7 % (ref 11.5–15.5)
WBC: 10.2 10*3/uL (ref 4.0–10.5)

## 2016-04-07 LAB — LIPID PANEL
CHOL/HDL RATIO: 4
CHOLESTEROL: 187 mg/dL (ref 0–200)
HDL: 53.1 mg/dL (ref >39.00)
LDL CALC: 105 mg/dL — AB (ref 0–99)
NONHDL: 133.6
TRIGLYCERIDES: 145 mg/dL (ref 0.0–149.0)
VLDL: 29 mg/dL (ref 0.0–40.0)

## 2016-04-07 LAB — COMPREHENSIVE METABOLIC PANEL
ALBUMIN: 4.1 g/dL (ref 3.5–5.2)
ALT: 20 U/L (ref 0–35)
AST: 19 U/L (ref 0–37)
Alkaline Phosphatase: 99 U/L (ref 39–117)
BUN: 28 mg/dL — ABNORMAL HIGH (ref 6–23)
CHLORIDE: 101 meq/L (ref 96–112)
CO2: 31 meq/L (ref 19–32)
CREATININE: 1.19 mg/dL (ref 0.40–1.20)
Calcium: 10.5 mg/dL (ref 8.4–10.5)
GFR: 47.04 mL/min — ABNORMAL LOW (ref >60.00)
Glucose, Bld: 91 mg/dL (ref 70–99)
POTASSIUM: 4.1 meq/L (ref 3.5–5.1)
SODIUM: 139 meq/L (ref 135–145)
Total Bilirubin: 0.5 mg/dL (ref 0.2–1.2)
Total Protein: 7 g/dL (ref 6.0–8.3)

## 2016-04-07 LAB — TSH: TSH: 2.61 u[IU]/mL (ref 0.35–4.50)

## 2016-04-07 LAB — HEMOGLOBIN A1C: HEMOGLOBIN A1C: 6 % (ref 4.6–6.5)

## 2016-04-07 LAB — VITAMIN D 25 HYDROXY (VIT D DEFICIENCY, FRACTURES): VITD: 20.57 ng/mL — ABNORMAL LOW (ref 30.00–100.00)

## 2016-04-08 ENCOUNTER — Other Ambulatory Visit: Payer: Self-pay | Admitting: Family

## 2016-04-08 DIAGNOSIS — E782 Mixed hyperlipidemia: Secondary | ICD-10-CM

## 2016-04-08 MED ORDER — ATORVASTATIN CALCIUM 40 MG PO TABS: ORAL_TABLET | ORAL | 2 refills | Status: DC

## 2016-04-22 ENCOUNTER — Ambulatory Visit: Payer: Medicare Other | Admitting: Family

## 2016-04-23 ENCOUNTER — Other Ambulatory Visit: Payer: Self-pay

## 2016-04-23 MED ORDER — CITALOPRAM HYDROBROMIDE 20 MG PO TABS: 20.0000 mg | ORAL_TABLET | Freq: Every day | ORAL | 2 refills | Status: DC

## 2016-05-03 DIAGNOSIS — H40053 Ocular hypertension, bilateral: Secondary | ICD-10-CM | POA: Diagnosis not present

## 2016-05-11 ENCOUNTER — Ambulatory Visit (INDEPENDENT_AMBULATORY_CARE_PROVIDER_SITE_OTHER): Payer: Medicare Other | Admitting: Family

## 2016-05-11 ENCOUNTER — Encounter: Payer: Self-pay | Admitting: Family

## 2016-05-11 VITALS — BP 128/74 | HR 62 | Temp 98.7°F | Resp 16 | Wt 160.2 lb

## 2016-05-11 DIAGNOSIS — K219 Gastro-esophageal reflux disease without esophagitis: Secondary | ICD-10-CM | POA: Diagnosis not present

## 2016-05-11 DIAGNOSIS — Z1239 Encounter for other screening for malignant neoplasm of breast: Secondary | ICD-10-CM

## 2016-05-11 DIAGNOSIS — Z1231 Encounter for screening mammogram for malignant neoplasm of breast: Secondary | ICD-10-CM | POA: Diagnosis not present

## 2016-05-11 DIAGNOSIS — N189 Chronic kidney disease, unspecified: Secondary | ICD-10-CM | POA: Insufficient documentation

## 2016-05-11 DIAGNOSIS — N1832 Chronic kidney disease, stage 3b: Secondary | ICD-10-CM | POA: Insufficient documentation

## 2016-05-11 NOTE — Assessment & Plan Note (Signed)
Ordered. Patient understands to schedule 

## 2016-05-11 NOTE — Assessment & Plan Note (Signed)
Symptoms improved on prilosec. Discussed using maintenance H2B and then PPI for breakthrough based on long term risks of PPI. Patient verbalized understanding

## 2016-05-11 NOTE — Patient Instructions (Addendum)
Great seeing you.  Glad you are doing so well.   Long term use beyond 3 months of proton pump inhibitors , also called PPI's, is associated with malabsorption of vitamins, chronic kidney disease, fracture risk, and diarrheal illnesses. PPI's include Nexium, Prilosec, Protonix, Dexilant, and Prevacid.   I generally recommend trying to control acid reflux with lifestyle modifications including avoiding trigger foods, not eating 2 hours prior to bedtime. You may use histamine 2 blockers daily to twice daily ( this is Zantac, Pepcid) and then when symptoms flare, start back on PPI for short course.

## 2016-05-11 NOTE — Progress Notes (Signed)
Pre visit review using our clinic review tool, if applicable. No additional management support is needed unless otherwise documented below in the visit note. 

## 2016-05-11 NOTE — Progress Notes (Signed)
Subjective:    Patient ID: Christie Stevenson, female    DOB: Feb 09, 1940, 76 y.o.   MRN: [id number]  CC: ADAIJAH ENDRES is a 76 y.o. female who presents today for follow up.   HPI:  GERD- So much better on prilosec. 'cured.' No epigastric pain.           HISTORY:  Past Medical History:  Diagnosis Date  . 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ease   . Chronic kidney disease    STAGE 3  . Depression   . GERD (gastroesophageal reflux disease)   . History of colon polyps   . History of diverticulitis   . History of kidney stones   . Hyperlipidemia   . Hypertension   . Migraine   . Osteoporosis    Past Surgical History:  Procedure Laterality Date  . CATARACT EXTRACTION Left 2014  . CATARACT EXTRACTION W/PHACO Right 04/01/2015   Procedure: CATARACT EXTRACTION PHACO AND INTRAOCULAR LENS PLACEMENT (IOC);  Surgeon: [name], MD;  Location: [facility] ORS;  Service: Ophthalmology;  Laterality: Right;  [place] 00:47 AP% 20.8 CDE 9.76 fluid pack lot # [id number]  . COLONOSCOPY    . COLONOSCOPY WITH PROPOFOL N/A 05/26/2015   Procedure: COLONOSCOPY WITH PROPOFOL;  Surgeon: [name], MD;  Location: [facility] END[facility]COPY;  Service: Endoscopy;  Laterality: N/A;  . TUBAL LIGATION     Family History  Problem Relation Age of Onset  . Arthritis Mother   . Hypertension Mother   . Osteoporosis Mother   . Osteoporosis Sister   . Osteoporosis Brother   . Heart disease Brother   . Diabetes Daughter   . Diabetes Son   . Cancer Paternal Uncle     Prostate Cancer  . Cancer Sister     Breast Cancer   . Osteoporosis Sister   . Osteoporosis Brother   . Heart disease Brother   . Diabetes Daughter     Allergies: Patient has no known allergies. Current Outpatient Prescriptions on File Prior to Visit  Medication Sig Dispense Refill  . atorvastatin (LIPITOR) 40 MG tablet TAKE 1 TABLET (20 MG TOTAL) BY MOUTH DAILY. 90 tablet 2  . Calcium Carb-Cholecalciferol (CALCIUM 600 + D PO) Take  600 mg by mouth 2 (two) times daily.    . citalopram (CELEXA) 20 MG tablet Take 1 tablet (20 mg total) by mouth daily. 90 tablet 2  . latanoprost (XALATAN) 0.005 % ophthalmic solution Place 1 drop into the right eye at bedtime.    . metoprolol succinate (TOPROL-XL) 25 MG 24 hr tablet Take 1 tablet (25 mg total) by mouth daily. 90 tablet 1  . omeprazole (PRILOSEC) 20 MG capsule Take 1 capsule (20 mg total) by mouth daily. 90 capsule 0  . triamterene-hydrochlorothiazide (MAXZIDE-25) 37.5-25 MG tablet Take 1 tablet by mouth daily. 90 tablet 0   No current facility-administered medications on file prior to visit.     Social History  Substance Use Topics  . Smoking status: Former Smoker    Quit date: 07/21/1988  . Smokeless tobacco: Never Used  . Alcohol use Yes     Comment: occasional drink    Review of Systems  Constitutional: Negative for chills and fever.  Respiratory: Negative for cough.   Cardiovascular: Negative for chest pain and palpitations.  Gastrointestinal: Negative for nausea and vomiting.      Objective:    BP 128/74 (BP Location: Left Arm, Patient Position: Sitting, Cuff Size: Normal)   Pulse 62  Temp 98.7 F (37.1 C) (Oral)   Resp 16   Wt 160 lb 4 oz (72.7 kg)   SpO2 97%   BMI 31.30 kg/m  BP Readings from Last 3 Encounters:  05/11/16 128/74  03/22/16 120/74  02/20/16 122/64   Wt Readings from Last 3 Encounters:  05/11/16 160 lb 4 oz (72.7 kg)  03/22/16 165 lb (74.8 kg)  02/20/16 158 lb 12.8 oz (72 kg)    Physical Exam  Constitutional: She appears well-developed and well-nourished.  Eyes: Conjunctivae are normal.  Cardiovascular: Normal rate, regular rhythm, normal heart sounds and normal pulses.   Pulmonary/Chest: Effort normal and breath sounds normal. She has no wheezes. She has no rhonchi. She has no rales.  Neurological: She is alert.  Skin: Skin is warm and dry.  Psychiatric: She has a normal mood and affect. Her speech is normal and behavior is  normal. Thought content normal.  Vitals reviewed.      Assessment & Plan:   Problem List Items Addressed This Visit      Digestive   GERD (gastroesophageal reflux disease) - Primary    Symptoms improved on prilosec. Discussed using maintenance H2B and then PPI for breakthrough based on long term risks of PPI. Patient verbalized understanding        Other   Screening for breast cancer    Ordered. Patient understands to schedule.       Relevant Orders   MM DIGITAL SCREENING BILATERAL       I am having Ms. Clinger maintain her Calcium Carb-Cholecalciferol (CALCIUM 600 + D PO), latanoprost, triamterene-hydrochlorothiazide, metoprolol succinate, omeprazole, atorvastatin, and citalopram.   No orders of the defined types were placed in this encounter.   Return precautions given.   Risks, benefits, and alternatives of the medications and treatment plan prescribed today were discussed, and patient expressed understanding.   Education regarding symptom management and diagnosis given to patient on AVS.  Continue to follow with [name], FNP for routine health maintenance.   Christie Stevenson and I agreed with plan.   [name], FNP

## 2016-06-15 ENCOUNTER — Ambulatory Visit: Payer: Medicare Other

## 2016-06-22 ENCOUNTER — Ambulatory Visit
Admission: RE | Admit: 2016-06-22 | Discharge: 2016-06-22 | Disposition: A | Discharge status: Home / Self Care | Payer: Medicare Other | Source: Ambulatory Visit | Attending: Family | Admitting: Family

## 2016-06-22 DIAGNOSIS — Z1231 Encounter for screening mammogram for malignant neoplasm of breast: Secondary | ICD-10-CM | POA: Insufficient documentation

## 2016-06-22 DIAGNOSIS — Z1239 Encounter for other screening for malignant neoplasm of breast: Secondary | ICD-10-CM

## 2016-06-24 ENCOUNTER — Other Ambulatory Visit: Payer: Self-pay

## 2016-06-24 MED ORDER — METOPROLOL SUCCINATE ER 25 MG PO TB24: 25.0000 mg | ORAL_TABLET | Freq: Every day | ORAL | 1 refills | Status: DC

## 2016-06-25 ENCOUNTER — Inpatient Hospital Stay
Admission: RE | Admit: 2016-06-25 | Discharge: 2016-06-25 | Disposition: A | Discharge status: Home / Self Care | Payer: Self-pay | Source: Ambulatory Visit | Attending: *Deleted | Admitting: *Deleted

## 2016-06-25 ENCOUNTER — Other Ambulatory Visit: Payer: Self-pay | Admitting: *Deleted

## 2016-06-25 DIAGNOSIS — Z9289 Personal history of other medical treatment: Secondary | ICD-10-CM

## 2016-07-06 ENCOUNTER — Other Ambulatory Visit: Payer: Self-pay | Admitting: *Deleted

## 2016-07-06 ENCOUNTER — Inpatient Hospital Stay
Admission: RE | Admit: 2016-07-06 | Discharge: 2016-07-06 | Disposition: A | Discharge status: Home / Self Care | Payer: Self-pay | Source: Ambulatory Visit | Attending: *Deleted | Admitting: *Deleted

## 2016-07-06 DIAGNOSIS — Z9289 Personal history of other medical treatment: Secondary | ICD-10-CM

## 2016-07-18 ENCOUNTER — Other Ambulatory Visit: Payer: Self-pay | Admitting: Family Medicine

## 2016-09-16 ENCOUNTER — Other Ambulatory Visit: Payer: Self-pay | Admitting: Family

## 2016-10-15 ENCOUNTER — Other Ambulatory Visit: Payer: Self-pay | Admitting: Family

## 2016-11-01 DIAGNOSIS — H40053 Ocular hypertension, bilateral: Secondary | ICD-10-CM | POA: Diagnosis not present

## 2017-01-05 ENCOUNTER — Other Ambulatory Visit: Payer: Self-pay

## 2017-01-05 MED ORDER — TRIAMTERENE-HCTZ 37.5-25 MG PO TABS: 1.0000 | ORAL_TABLET | Freq: Every day | ORAL | 0 refills | Status: DC

## 2017-01-05 NOTE — Telephone Encounter (Signed)
Medication has been refilled.

## 2017-01-09 ENCOUNTER — Other Ambulatory Visit: Payer: Self-pay | Admitting: Family

## 2017-01-09 DIAGNOSIS — E782 Mixed hyperlipidemia: Secondary | ICD-10-CM

## 2017-02-02 NOTE — Progress Notes (Deleted)
MRN : [id number]  Christie Stevenson is a 77 y.o. (08/26/1940) female who presents with chief complaint of No chief complaint on file. Marland Kitchen  History of Present Illness: The patient is seen for follow up evaluation of carotid stenosis. The carotid stenosis followed by ultrasound.   The patient denies amaurosis fugax. There is no recent history of TIA symptoms or focal motor deficits. There is no prior documented CVA.  The patient is taking enteric-coated aspirin 81 mg daily.  There is no history of migraine headaches. There is no history of seizures.  The patient has a history of coronary artery disease, no recent episodes of angina or shortness of breath. The patient denies PAD or claudication symptoms. There is a history of hyperlipidemia which is being treated with a statin.    Carotid Duplex done today shows ***.  No change compared to last study in ***  No outpatient medications have been marked as taking for the 02/03/17 encounter (Appointment) with Delana Meyer, Dolores Lory, MD.    Past Medical History:  Diagnosis Date  . 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ease   . Chronic kidney disease    STAGE 3  . Depression   . GERD (gastroesophageal reflux disease)   . History of colon polyps   . History of diverticulitis   . History of kidney stones   . Hyperlipidemia   . Hypertension   . Migraine   . Osteoporosis     Past Surgical History:  Procedure Laterality Date  . CATARACT EXTRACTION Left 2014  . CATARACT EXTRACTION W/PHACO Right 04/01/2015   Procedure: CATARACT EXTRACTION PHACO AND INTRAOCULAR LENS PLACEMENT (IOC);  Surgeon: [name], MD;  Location: [facility] ORS;  Service: Ophthalmology;  Laterality: Right;  [place] 00:47 AP% 20.8 CDE 9.76 fluid pack lot # [id number]  . COLONOSCOPY    . COLONOSCOPY WITH PROPOFOL N/A 05/26/2015   Procedure: COLONOSCOPY WITH PROPOFOL;  Surgeon: [name], MD;  Location: [facility] ENDOSCOPY;  Service: Endoscopy;  Laterality: N/A;  . TUBAL  LIGATION      Social History Social History   Tobacco Use  . Smoking status: Former Smoker    Last attempt to quit: 07/21/1988    Years since quitting: 28.5  . Smokeless tobacco: Never Used  Substance Use Topics  . Alcohol use: Yes    Comment: occasional drink  . Drug use: No    Family History Family History  Problem Relation Age of Onset  . Arthritis Mother   . Hypertension Mother   . Osteoporosis Mother   . Osteoporosis Sister   . Osteoporosis Brother   . Heart disease Brother   . Diabetes Daughter   . Diabetes Son   . Cancer Paternal Uncle        Prostate Cancer  . Cancer Sister        Breast Cancer   . Osteoporosis Sister   . Breast cancer Sister        mid 4's  . Osteoporosis Brother   . Heart disease Brother   . Diabetes Daughter     No Known Allergies   REVIEW OF SYSTEMS (Negative unless checked)  Constitutional: [] Weight loss  [] Fever  [] Chills Cardiac: [] Chest pain   [] Chest pressure   [] Palpitations   [] Shortness of breath when laying flat   [] Shortness of breath with exertion. Vascular:  [] Pain in legs with walking   [] Pain in legs at rest  [] History of DVT   [] Phlebitis   [] Swelling in legs   []   Varicose veins   [] Non-healing ulcers Pulmonary:   [] Uses home oxygen   [] Productive cough   [] Hemoptysis   [] Wheeze  [] COPD   [] Asthma Neurologic:  [] Dizziness   [] Seizures   [] History of stroke   [] History of TIA  [] Aphasia   [] Vissual changes   [] Weakness or numbness in arm   [] Weakness or numbness in leg Musculoskeletal:   [] Joint swelling   [] Joint pain   [] Low back pain Hematologic:  [] Easy bruising  [] Easy bleeding   [] Hypercoagulable state   [] Anemic Gastrointestinal:  [] Diarrhea   [] Vomiting  [] Gastroesophageal reflux/heartburn   [] Difficulty swallowing. Genitourinary:  [] Chronic kidney disease   [] Difficult urination  [] Frequent urination   [] Blood in urine Skin:  [] Rashes   [] Ulcers  Psychological:  [] History of anxiety   []  History of major  depression.  Physical Examination  There were no vitals filed for this visit. There is no height or weight on file to calculate BMI. Gen: WD/WN, NAD Head: [place]/AT, No temporalis wasting.  Ear/Nose/Throat: Hearing grossly intact, nares w/o erythema or drainage Eyes: PER, EOMI, sclera nonicteric.  Neck: Supple, no large masses.   Pulmonary:  Good air movement, no audible wheezing bilaterally, no use of accessory muscles.  Cardiac: RRR, no JVD Vascular: *** Vessel Right Left  Radial Palpable Palpable  Ulnar Palpable Palpable  Brachial Palpable Palpable  Carotid Palpable Palpable  Femoral Palpable Palpable  Popliteal Palpable Palpable  PT Palpable Palpable  DP Palpable Palpable  Gastrointestinal: Non-distended. No guarding/no peritoneal signs.  Musculoskeletal: M/S 5/5 throughout.  No deformity or atrophy.  Neurologic: CN 2-12 intact. Symmetrical.  Speech is fluent. Motor exam as listed above. Psychiatric: Judgment intact, Mood & affect appropriate for pt's clinical situation. Dermatologic: No rashes or ulcers noted.  No changes consistent with cellulitis. Lymph : No lichenification or skin changes of chronic lymphedema.  CBC Lab Results  Component Value Date   WBC 10.2 04/07/2016   HGB 13.3 04/07/2016   HCT 40.7 04/07/2016   MCV 86.8 04/07/2016   PLT 309.0 04/07/2016    BMET    Component Value Date/Time   NA 139 04/07/2016 0914   K 4.1 04/07/2016 0914   CL 101 04/07/2016 0914   CO2 31 04/07/2016 0914   GLUCOSE 91 04/07/2016 0914   BUN 28 (H) 04/07/2016 0914   CREATININE 1.19 04/07/2016 0914   CALCIUM 10.5 04/07/2016 0914   CrCl cannot be calculated (Patient's most recent lab result is older than the maximum 21 days allowed.).  COAG No results found for: INR, PROTIME  Radiology No results found.  Outside Studies/Documentation *** pages of outside documents were reviewed.  They showed ***.  Assessment/Plan 1. Stenosis of right carotid artery ***  2.  Essential hypertension ***  3. Gastroesophageal reflux disease, esophagitis presence not specified ***  4. Mixed hyperlipidemia ***    Hortencia Pilar, MD  02/02/2017 8:22 PM

## 2017-02-03 ENCOUNTER — Ambulatory Visit (INDEPENDENT_AMBULATORY_CARE_PROVIDER_SITE_OTHER): Payer: Medicare Other | Admitting: Vascular Surgery

## 2017-02-03 ENCOUNTER — Encounter (INDEPENDENT_AMBULATORY_CARE_PROVIDER_SITE_OTHER): Payer: Medicare Other

## 2017-02-15 ENCOUNTER — Ambulatory Visit: Payer: Medicare Other | Admitting: Internal Medicine

## 2017-02-22 ENCOUNTER — Ambulatory Visit (INDEPENDENT_AMBULATORY_CARE_PROVIDER_SITE_OTHER): Payer: Medicare Other

## 2017-02-22 VITALS — BP 102/62 | HR 64 | Temp 97.9°F | Resp 14 | Ht 60.0 in | Wt 155.8 lb

## 2017-02-22 DIAGNOSIS — Z0000 Encounter for general adult medical examination without abnormal findings: Secondary | ICD-10-CM

## 2017-02-22 NOTE — Patient Instructions (Addendum)
  Ms. Shropshire , Thank you for taking time to come for your Medicare Wellness Visit. I appreciate your ongoing commitment to your health goals. Please review the following plan we discussed and let me know if I can assist you in the future.   Follow up with [name] as needed.    Bring a copy of your [place] and/or Living Will to be scanned into chart once completed.  Have a great day!  These are the goals we discussed: Goals    . Healthy Lifestyle     Choose lean meats (chicken, [place], fish), fruits and vegetables when eating Stay hydrated! Drink plenty of fluids/water.    . Increase physical activity     Weekly exercises (Tai-Chi) at the [facility] if available.  Begin walking 30 minutes a day, 3 times a week.       This is a list of the screening recommended for you and due dates:  Health Maintenance  Topic Date Due  . Tetanus Vaccine  07/24/1960  . Flu Shot  08/18/2016  . Colon Cancer Screening  05/25/2020  . DEXA scan (bone density measurement)  Completed  . Pneumonia vaccines  Completed

## 2017-02-22 NOTE — Progress Notes (Signed)
Subjective:   Christie Stevenson is a 77 y.o. female who presents for Medicare Annual (Subsequent) preventive examination.  Review of Systems:  No ROS.  Medicare Wellness Visit. Additional risk factors are reflected in the social history.  Cardiac Risk Factors include: advanced age (>49mn, >>11women);hypertension     Objective:     Vitals: BP 102/62 (BP Location: Left Arm, Patient Position: Sitting, Cuff Size: Normal)   Pulse 64   Temp 97.9 F (36.6 C) (Oral)   Resp 14   Ht 5' (1.524 m)   Wt 155 lb 12.8 oz (70.7 kg)   SpO2 97%   BMI 30.43 kg/m   Body mass index is 30.43 kg/m.  Advanced Directives 02/22/2017 02/20/2016 05/26/2015 02/19/2015  Does Patient Have a Medical Advance Directive? No No No No  Would patient like information on creating a medical advance directive? Yes (MAU/Ambulatory/Procedural Areas - Information given) No - Patient declined No - patient declined information Yes - Educational materials given    Tobacco Social History   Tobacco Use  Smoking Status Former Smoker  . Last attempt to quit: 07/21/1988  . Years since quitting: 28.6  Smokeless Tobacco Never Used     Counseling given: Not Answered   Clinical Intake:  Pre-visit preparation completed: Yes  Pain : No/denies pain     Nutritional Status: BMI > 30  Obese Diabetes: No  How often do you need to have someone help you when you read instructions, pamphlets, or other written materials from your doctor or pharmacy?: 1 - Never  Interpreter Needed?: No     Past Medical History:  Diagnosis Date  . 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ease   . Chronic kidney disease    STAGE 3  . Depression   . GERD (gastroesophageal reflux disease)   . History of colon polyps   . History of diverticulitis   . History of kidney stones   . Hyperlipidemia   . Hypertension   . Migraine   . Osteoporosis    Past Surgical History:  Procedure Laterality Date  . CATARACT EXTRACTION Left 2014  .  CATARACT EXTRACTION W/PHACO Right 04/01/2015   Procedure: CATARACT EXTRACTION PHACO AND INTRAOCULAR LENS PLACEMENT (IOC);  Surgeon: WBirder Robson MD;  Location: [facility] ORS;  Service: Ophthalmology;  Laterality: Right;  [place]:47 AP% 20.8 CDE 9.76 fluid pack lot # [id number]  . COLONOSCOPY    . COLONOSCOPY WITH PROPOFOL N/A 05/26/2015   Procedure: COLONOSCOPY WITH PROPOFOL;  Surgeon: RManya Silvas MD;  [facility];  Service: Endoscopy;  Laterality: N/A;  . TUBAL LIGATION     Family History  Problem Relation Age of Onset  . Arthritis Mother   . Hypertension Mother   . Osteoporosis Mother   . Osteoporosis Sister   . Osteoporosis Brother   . Heart disease Brother   . Diabetes Daughter   . Diabetes Son   . Cancer Paternal Uncle        Prostate Cancer  . Cancer Sister        Breast Cancer   . Osteoporosis Sister   . Breast cancer Sister        mid 736's . Osteoporosis Brother   . Heart disease Brother   . Diabetes Daughter    Social History   Socioeconomic History  . Marital status: Single    Spouse name: None  . Number of children: 5  . Years of education: 138 . Highest education level: None  Social Needs  . Financial resource strain: None  . Food insecurity - worry: None  . Food insecurity - inability: None  . Transportation needs - medical: None  . Transportation needs - non-medical: None  Occupational History  . Occupation: Press photographer work    Comment: Retired  Immunologist  . Smoking status: Former Smoker    Last attempt to quit: 07/21/1988    Years since quitting: 28.6  . Smokeless tobacco: Never Used  Substance and Sexual Activity  . Alcohol use: No    Frequency: Never  . Drug use: No  . Sexual activity: No  Other Topics Concern  . None  Social History Narrative   Caffeine - 2 cups of coffee   Exercise - none regularly   Diet - tries to eat as healthy as possible but can do better      Does not have a Living Will          Outpatient  Encounter Medications as of 02/22/2017  Medication Sig  . atorvastatin (LIPITOR) 20 MG tablet TAKE 1 TABLET (20 MG TOTAL) BY MOUTH DAILY.  . Calcium Carb-Cholecalciferol (CALCIUM 600 + D PO) Take 600 mg by mouth 2 (two) times daily.  Marland Kitchen latanoprost (XALATAN) 0.005 % ophthalmic solution Place 1 drop into the right eye at bedtime.  . metoprolol succinate (TOPROL-XL) 25 MG 24 hr tablet Take 1 tablet (25 mg total) by mouth daily.  Marland Kitchen triamterene-hydrochlorothiazide (MAXZIDE-25) 37.5-25 MG tablet Take 1 tablet by mouth daily.  . [DISCONTINUED] atorvastatin (LIPITOR) 40 MG tablet TAKE 1 TABLET (20 MG TOTAL) BY MOUTH DAILY.  . [DISCONTINUED] citalopram (CELEXA) 20 MG tablet Take 1 tablet (20 mg total) by mouth daily.  Marland Kitchen omeprazole (PRILOSEC) 20 MG capsule Take 1 capsule (20 mg total) by mouth daily. (Patient not taking: Reported on 02/22/2017)   No facility-administered encounter medications on file as of 02/22/2017.     Activities of Daily Living In your present state of health, do you have any difficulty performing the following activities: 02/22/2017  Hearing? N  Vision? N  Difficulty concentrating or making decisions? N  Walking or climbing stairs? N  Dressing or bathing? N  Doing errands, shopping? N  Preparing Food and eating ? N  Using the Toilet? N  In the past six months, have you accidently leaked urine? N  Do you have problems with loss of bowel control? N  Managing your Medications? N  Managing your Finances? N  Housekeeping or managing your Housekeeping? N  Some recent data might be hidden    Patient Care Team: Burnard Hawthorne, FNP as PCP - General (Family Medicine)    Assessment:   This is a routine wellness examination for [name]. The goal of the wellness visit is to assist the patient how to close the gaps in care and create a preventative care plan for the patient.   The roster of all physicians providing medical care to patient is listed in the Snapshot section of the  chart.  Taking calcium VIT D as appropriate/Osteoporosis reviewed.    Safety issues reviewed; Smoke and carbon monoxide detectors in the home. No firearms in the home.  Wears seatbelts when driving or riding with others. Patient does wear sunscreen or protective clothing when in direct sunlight. No violence in the home.  Patient is alert, normal appearance, oriented to person/place/and time. Correctly identified the president of the [place], recall of 3/3 words, and performing simple calculations. Displays appropriate judgement and can read correct time  from watch face.   No new identified risk were noted.  No failures at ADL's or IADL's.    BMI- discussed the importance of a healthy diet, water intake and the benefits of aerobic exercise. Educational material provided.   24 hour diet recall: Regular diet  Daily fluid intake: 1/2 cups of caffeine, 8 cups of water, 3 cups of milk  Eye- Visual acuity not assessed per patient preference since they have regular follow up with the ophthalmologist.  Wears corrective lenses.  Sleep patterns- Sleeps 6-8 hours at night.  Wakes feeling rested.  TDAP vaccine deferred per patient preference.  Follow up with insurance.  Educational material provided.  Patient Concerns: None at this time. Follow up with PCP as needed.  Exercise Activities and Dietary recommendations Current Exercise Habits: Home exercise routine, Type of exercise: walking;calisthenics, Time (Minutes): 20, Frequency (Times/Week): 3, Weekly Exercise (Minutes/Week): 60, Intensity: Mild  Goals    . Healthy Lifestyle     Choose lean meats (chicken, [place], fish), fruits and vegetables when eating Stay hydrated! Drink plenty of fluids/water.    . Increase physical activity     Weekly exercises (Tai-Chi) at the [facility] if available.  Begin walking 30 minutes a day, 3 times a week.       Fall Risk Fall Risk  02/22/2017 02/20/2016 02/19/2015 01/03/2014  Falls in the past year? No  No No No    Depression Screen PHQ 2/9 Scores 02/22/2017 02/20/2016 02/19/2015 01/03/2014  PHQ - 2 Score 0 0 0 0     Cognitive Function MMSE - Mini Mental State Exam 02/19/2015  Orientation to time 5  Orientation to Place 5  Registration 3  Attention/ Calculation 5  Recall 3  Language- name 2 objects 2  Language- repeat 1  Language- follow 3 step command 3  Language- read & follow direction 1  Write a sentence 1  Copy design 1  Total score 30     6CIT Screen 02/22/2017  What Year? 0 points  What month? 0 points  What time? 0 points  Count back from 20 0 points  Months in reverse 0 points  Repeat phrase 0 points  Total Score 0    Immunization History  Administered Date(s) Administered  . Influenza Split 10/19/2013  . Influenza-Unspecified 12/02/2012, 10/15/2014, 10/19/2015  . Pneumococcal Conjugate-13 12/02/2012  . Pneumococcal Polysaccharide-23 03/17/2015      Screening Tests Health Maintenance  Topic Date Due  . TETANUS/TDAP  07/24/1960  . INFLUENZA VACCINE  08/18/2016  . COLONOSCOPY  05/25/2020  . DEXA SCAN  Completed  . PNA vac Low Risk Adult  Completed       Plan:   End of life planning; Advanced aging; Advanced directives discussed.  No HCPOA/Living Will.  Additional information provided to help them start the conversation with family.  Copy of HCPOA/Living Will requested upon completion. Time spent on this topic is 25 minutes.   I have personally reviewed and noted the following in the patient's chart:   . Medical and social history . Use of alcohol, tobacco or illicit drugs  . Current medications and supplements . Functional ability and status . Nutritional status . Physical activity . Advanced directives . List of other physicians . Hospitalizations, surgeries, and ER visits in previous 12 months . Vitals . Screenings to include cognitive, depression, and falls . Referrals and appointments  In addition, I have reviewed and discussed with patient  certain preventive protocols, quality metrics, and best practice recommendations. A written  personalized care plan for preventive services as well as general preventive health recommendations were provided to patient.     Varney Biles, LPN  [id number]

## 2017-02-24 ENCOUNTER — Telehealth (INDEPENDENT_AMBULATORY_CARE_PROVIDER_SITE_OTHER): Payer: Self-pay | Admitting: Vascular Surgery

## 2017-02-24 ENCOUNTER — Ambulatory Visit (INDEPENDENT_AMBULATORY_CARE_PROVIDER_SITE_OTHER): Payer: Medicare Other | Admitting: Vascular Surgery

## 2017-02-24 ENCOUNTER — Encounter (INDEPENDENT_AMBULATORY_CARE_PROVIDER_SITE_OTHER): Payer: Medicare Other

## 2017-02-24 NOTE — Telephone Encounter (Signed)
Patient arrived for appt today. She was scheduled in a 1pm slot. When she approached the window she asked "how behind is Dr. Delana Meyer" I informed her that we had not begun the afternoon clinic yet as the staff is beginning to return from lunch so we will be starting shortly.  She then stated " so I am not going to be seen for an hour" I asked her "what time is your appt scheduled for" . She stated 1o'clock. I then informed her that it would not take her an hour to be called back" she stated well you said that yall are not back from lunch yet. I stated that "we are all coming in now, and that we begin registration at 12:45 in an attempt to have the patients registered for the start of clinic. She stated " so I am going to be the first one called" I then responded that "There are more than one person in the 1pm time slot that had checked in before her so she more than likely would not be the first one to go back" as I did not want her to be under the assumption that she would be the first person called back. She then stated "well you have more that one person in a slot and only one provider" I responded " we actually have more than one provider and that everyone is not here for the same service. She then asked "So if I make another appointment it will be the same thing". I responded "Yes, we always have more than one patient to report for the different time frames" she than stated "well my daughter told me to find a new doctor after my case anyway, so I am just going to do that". I stated "ok ma'am". Then instead of proceeding with the check in process she turned and walked out of the door.

## 2017-03-13 ENCOUNTER — Other Ambulatory Visit: Payer: Self-pay | Admitting: Family

## 2017-03-15 ENCOUNTER — Telehealth: Payer: Self-pay | Admitting: Family

## 2017-03-15 NOTE — Telephone Encounter (Signed)
Copied from [place]. Topic: Quick Communication - Rx Refill/Question >> Mar 15, 2017 12:42 PM Percell Belt A wrote: Medication:  triamterene-hydrochlorothiazide (MAXZIDE-25) 37.5-25 MG tablet [[id number]]    Has the patient contacted their pharmacy? yes   (Agent: If no, request that the patient contact the pharmacy for the refill.)   Preferred Pharmacy (with phone number or street name): [facility] [address]    Agent: Please be advised that RX refills may take up to 3 business days. We ask that you follow-up with your pharmacy.

## 2017-03-16 MED ORDER — TRIAMTERENE-HCTZ 37.5-25 MG PO TABS: 1.0000 | ORAL_TABLET | Freq: Every day | ORAL | 0 refills | Status: DC

## 2017-04-08 ENCOUNTER — Ambulatory Visit (INDEPENDENT_AMBULATORY_CARE_PROVIDER_SITE_OTHER): Payer: Medicare Other | Admitting: Family

## 2017-04-08 ENCOUNTER — Encounter: Payer: Self-pay | Admitting: Family

## 2017-04-08 VITALS — BP 118/76 | HR 67 | Temp 98.1°F | Resp 15 | Ht 62.0 in | Wt 157.5 lb

## 2017-04-08 DIAGNOSIS — K219 Gastro-esophageal reflux disease without esophagitis: Secondary | ICD-10-CM | POA: Diagnosis not present

## 2017-04-08 DIAGNOSIS — M810 Age-related osteoporosis without current pathological fracture: Secondary | ICD-10-CM | POA: Diagnosis not present

## 2017-04-08 DIAGNOSIS — Z0000 Encounter for general adult medical examination without abnormal findings: Secondary | ICD-10-CM

## 2017-04-08 DIAGNOSIS — I10 Essential (primary) hypertension: Secondary | ICD-10-CM

## 2017-04-08 LAB — COMPREHENSIVE METABOLIC PANEL
ALT: 22 U/L (ref 0–35)
AST: 21 U/L (ref 0–37)
Albumin: 3.8 g/dL (ref 3.5–5.2)
Alkaline Phosphatase: 98 U/L (ref 39–117)
BUN: 26 mg/dL — ABNORMAL HIGH (ref 6–23)
CO2: 29 meq/L (ref 19–32)
Calcium: 11.1 mg/dL — ABNORMAL HIGH (ref 8.4–10.5)
Chloride: 102 mEq/L (ref 96–112)
Creatinine, Ser: 1.15 mg/dL (ref 0.40–1.20)
GFR: 48.8 mL/min — AB (ref >60.00)
GLUCOSE: 76 mg/dL (ref 70–99)
POTASSIUM: 3.4 meq/L — AB (ref 3.5–5.1)
Sodium: 140 mEq/L (ref 135–145)
Total Bilirubin: 0.4 mg/dL (ref 0.2–1.2)
Total Protein: 7.1 g/dL (ref 6.0–8.3)

## 2017-04-08 LAB — CBC WITH DIFFERENTIAL/PLATELET
BASOS ABS: 0.1 10*3/uL (ref 0.0–0.1)
Basophils Relative: 0.8 % (ref 0.0–3.0)
EOS PCT: 2.2 % (ref 0.0–5.0)
Eosinophils Absolute: 0.2 10*3/uL (ref 0.0–0.7)
HCT: 37.9 % (ref 36.0–46.0)
Hemoglobin: 12.8 g/dL (ref 12.0–15.0)
LYMPHS ABS: 1.9 10*3/uL (ref 0.7–4.0)
Lymphocytes Relative: 19.4 % (ref 12.0–46.0)
MCHC: 33.8 g/dL (ref 30.0–36.0)
MCV: 85.7 fl (ref 78.0–100.0)
MONOS PCT: 7.7 % (ref 3.0–12.0)
Monocytes Absolute: 0.8 10*3/uL (ref 0.1–1.0)
NEUTROS PCT: 69.9 % (ref 43.0–77.0)
Neutro Abs: 6.9 10*3/uL (ref 1.4–7.7)
PLATELETS: 270 10*3/uL (ref 150.0–400.0)
RBC: 4.43 Mil/uL (ref 3.87–5.11)
RDW: 15.2 % (ref 11.5–15.5)
WBC: 9.9 10*3/uL (ref 4.0–10.5)

## 2017-04-08 LAB — LIPID PANEL
CHOL/HDL RATIO: 3
CHOLESTEROL: 137 mg/dL (ref 0–200)
HDL: 47.1 mg/dL (ref >39.00)
LDL CALC: 61 mg/dL (ref 0–99)
NonHDL: 90.23
TRIGLYCERIDES: 146 mg/dL (ref 0.0–149.0)
VLDL: 29.2 mg/dL (ref 0.0–40.0)

## 2017-04-08 LAB — HEMOGLOBIN A1C: Hgb A1c MFr Bld: 5.9 % (ref 4.6–6.5)

## 2017-04-08 LAB — VITAMIN D 25 HYDROXY (VIT D DEFICIENCY, FRACTURES): VITD: 22.58 ng/mL — ABNORMAL LOW (ref 30.00–100.00)

## 2017-04-08 LAB — TSH: TSH: 2.35 u[IU]/mL (ref 0.35–4.50)

## 2017-04-08 NOTE — Patient Instructions (Signed)
DEXA scan - lets see what it shows  Today we discussed referrals, orders- dermatology   I have placed these orders in the system for you.  Please be sure to give [place] a call if you have not heard from our office regarding scheduling a test or regarding referral in a timely manner.  It is very important that you let me know as soon as possible.     Health Maintenance, Female Adopting a healthy lifestyle and getting preventive care can go a long way to promote health and wellness. Talk with your health care provider about what schedule of regular examinations is right for you. This is a good chance for you to check in with your provider about disease prevention and staying healthy. In between checkups, there are plenty of things you can do on your own. Experts have done a lot of research about which lifestyle changes and preventive measures are most likely to keep you healthy. Ask your health care provider for more information. Weight and diet Eat a healthy diet  Be sure to include plenty of vegetables, fruits, low-fat dairy products, and lean protein.  Do not eat a lot of foods high in solid fats, added sugars, or salt.  Get regular exercise. This is one of the most important things you can do for your health. ? Most adults should exercise for at least 150 minutes each week. The exercise should increase your heart rate and make you sweat (moderate-intensity exercise). ? Most adults should also do strengthening exercises at least twice a week. This is in addition to the moderate-intensity exercise.  Maintain a healthy weight  Body mass index (BMI) is a measurement that can be used to identify possible weight problems. It estimates body fat based on height and weight. Your health care provider can help determine your BMI and help you achieve or maintain a healthy weight.  For females 47 years of age and older: ? A BMI below 18.5 is considered underweight. ? A BMI of 18.5 to 24.9 is  normal. ? A BMI of 25 to 29.9 is considered overweight. ? A BMI of 30 and above is considered obese.  Watch levels of cholesterol and blood lipids  You should start having your blood tested for lipids and cholesterol at 76 years of age, then have this test every 5 years.  You may need to have your cholesterol levels checked more often if: ? Your lipid or cholesterol levels are high. ? You are older than 76 years of age. ? You are at high risk for heart disease.  Cancer screening Lung Cancer  Lung cancer screening is recommended for adults 19-73 years old who are at high risk for lung cancer because of a history of smoking.  A yearly low-dose CT scan of the lungs is recommended for people who: ? Currently smoke. ? Have quit within the past 15 years. ? Have at least a 30-pack-year history of smoking. A pack year is smoking an average of one pack of cigarettes a day for 1 year.  Yearly screening should continue until it has been 15 years since you quit.  Yearly screening should stop if you develop a health problem that would prevent you from having lung cancer treatment.  Breast Cancer  Practice breast self-awareness. This means understanding how your breasts normally appear and feel.  It also means doing regular breast self-exams. Let your health care provider know about any changes, no matter how small.  If you are in your  75s or 24s, you should have a clinical breast exam (CBE) by a health care provider every 1-3 years as part of a regular health exam.  If you are 69 or older, have a CBE every year. Also consider having a breast X-ray (mammogram) every year.  If you have a family history of breast cancer, talk to your health care provider about genetic screening.  If you are at high risk for breast cancer, talk to your health care provider about having an MRI and a mammogram every year.  Breast cancer gene (BRCA) assessment is recommended for women who have family members  with BRCA-related cancers. BRCA-related cancers include: ? Breast. ? Ovarian. ? Tubal. ? Peritoneal cancers.  Results of the assessment will determine the need for genetic counseling and BRCA1 and BRCA2 testing.  Cervical Cancer Your health care provider may recommend that you be screened regularly for cancer of the pelvic organs (ovaries, uterus, and vagina). This screening involves a pelvic examination, including checking for microscopic changes to the surface of your cervix (Pap test). You may be encouraged to have this screening done every 3 years, beginning at age 57.  For women ages 48-65, health care providers may recommend pelvic exams and Pap testing every 3 years, or they may recommend the Pap and pelvic exam, combined with testing for human papilloma virus (HPV), every 5 years. Some types of HPV increase your risk of cervical cancer. Testing for HPV may also be done on women of any age with unclear Pap test results.  Other health care providers may not recommend any screening for nonpregnant women who are considered low risk for pelvic cancer and who do not have symptoms. Ask your health care provider if a screening pelvic exam is right for you.  If you have had past treatment for cervical cancer or a condition that could lead to cancer, you need Pap tests and screening for cancer for at least 20 years after your treatment. If Pap tests have been discontinued, your risk factors (such as having a new sexual partner) need to be reassessed to determine if screening should resume. Some women have medical problems that increase the chance of getting cervical cancer. In these cases, your health care provider may recommend more frequent screening and Pap tests.  Colorectal Cancer  This type of cancer can be detected and often prevented.  Routine colorectal cancer screening usually begins at 76 years of age and continues through 76 years of age.  Your health care provider may recommend  screening at an earlier age if you have risk factors for colon cancer.  Your health care provider may also recommend using home test kits to check for hidden blood in the stool.  A small camera at the end of a tube can be used to examine your colon directly (sigmoidoscopy or colonoscopy). This is done to check for the earliest forms of colorectal cancer.  Routine screening usually begins at age 13.  Direct examination of the colon should be repeated every 5-10 years through 76 years of age. However, you may need to be screened more often if early forms of precancerous polyps or small growths are found.  Skin Cancer  Check your skin from head to toe regularly.  Tell your health care provider about any new moles or changes in moles, especially if there is a change in a mole's shape or color.  Also tell your health care provider if you have a mole that is larger than the size of  a pencil eraser.  Always use sunscreen. Apply sunscreen liberally and repeatedly throughout the day.  Protect yourself by wearing long sleeves, pants, a wide-brimmed hat, and sunglasses whenever you are outside.  Heart disease, diabetes, and high blood pressure  High blood pressure causes heart disease and increases the risk of stroke. High blood pressure is more likely to develop in: ? People who have blood pressure in the high end of the normal range (130-139/85-89 mm Hg). ? People who are overweight or obese. ? People who are African American.  If you are 30-39 years of age, have your blood pressure checked every 3-5 years. If you are 52 years of age or older, have your blood pressure checked every year. You should have your blood pressure measured twice-once when you are at a hospital or clinic, and once when you are not at a hospital or clinic. Record the average of the two measurements. To check your blood pressure when you are not at a hospital or clinic, you can use: ? An automated blood pressure machine at  a pharmacy. ? A home blood pressure monitor.  If you are between 16 years and 19 years old, ask your health care provider if you should take aspirin to prevent strokes.  Have regular diabetes screenings. This involves taking a blood sample to check your fasting blood sugar level. ? If you are at a normal weight and have a low risk for diabetes, have this test once every three years after 76 years of age. ? If you are overweight and have a high risk for diabetes, consider being tested at a younger age or more often. Preventing infection Hepatitis B  If you have a higher risk for hepatitis B, you should be screened for this virus. You are considered at high risk for hepatitis B if: ? You were born in a country where hepatitis B is common. Ask your health care provider which countries are considered high risk. ? Your parents were born in a high-risk country, and you have not been immunized against hepatitis B (hepatitis B vaccine). ? You have HIV or AIDS. ? You use needles to inject street drugs. ? You live with someone who has hepatitis B. ? You have had sex with someone who has hepatitis B. ? You get hemodialysis treatment. ? You take certain medicines for conditions, including cancer, organ transplantation, and autoimmune conditions.  Hepatitis C  Blood testing is recommended for: ? Everyone born from [birth year] through 1964. ? Anyone with known risk factors for hepatitis C.  Sexually transmitted infections (STIs)  You should be screened for sexually transmitted infections (STIs) including gonorrhea and chlamydia if: ? You are sexually active and are younger than 76 years of age. ? You are older than 76 years of age and your health care provider tells you that you are at risk for this type of infection. ? Your sexual activity has changed since you were last screened and you are at an increased risk for chlamydia or gonorrhea. Ask your health care provider if you are at risk.  If you do  not have HIV, but are at risk, it may be recommended that you take a prescription medicine daily to prevent HIV infection. This is called pre-exposure prophylaxis (PrEP). You are considered at risk if: ? You are sexually active and do not regularly use condoms or know the HIV status of your partner(s). ? You take drugs by injection. ? You are sexually active with a partner who has  HIV.  Talk with your health care provider about whether you are at high risk of being infected with HIV. If you choose to begin PrEP, you should first be tested for HIV. You should then be tested every 3 months for as long as you are taking PrEP. Pregnancy  If you are premenopausal and you may become pregnant, ask your health care provider about preconception counseling.  If you may become pregnant, take 400 to 800 micrograms (mcg) of folic acid every day.  If you want to prevent pregnancy, talk to your health care provider about birth control (contraception). Osteoporosis and menopause  Osteoporosis is a disease in which the bones lose minerals and strength with aging. This can result in serious bone fractures. Your risk for osteoporosis can be identified using a bone density scan.  If you are 45 years of age or older, or if you are at risk for osteoporosis and fractures, ask your health care provider if you should be screened.  Ask your health care provider whether you should take a calcium or vitamin D supplement to lower your risk for osteoporosis.  Menopause may have certain physical symptoms and risks.  Hormone replacement therapy may reduce some of these symptoms and risks. Talk to your health care provider about whether hormone replacement therapy is right for you. Follow these instructions at home:  Schedule regular health, dental, and eye exams.  Stay current with your immunizations.  Do not use any tobacco products including cigarettes, chewing tobacco, or electronic cigarettes.  If you are  pregnant, do not drink alcohol.  If you are breastfeeding, limit how much and how often you drink alcohol.  Limit alcohol intake to no more than 1 drink per day for nonpregnant women. One drink equals 12 ounces of beer, 5 ounces of wine, or 1 ounces of hard liquor.  Do not use street drugs.  Do not share needles.  Ask your health care provider for help if you need support or information about quitting drugs.  Tell your health care provider if you often feel depressed.  Tell your health care provider if you have ever been abused or do not feel safe at home. This information is not intended to replace advice given to you by your health care provider. Make sure you discuss any questions you have with your health care provider. Document Released: 07/20/2010 Document Revised: 06/12/2015 Document Reviewed: 10/08/2014 Elsevier Interactive Patient Education  [employer].

## 2017-04-08 NOTE — Assessment & Plan Note (Addendum)
Reluctant to start medication. Will repeat DEXA and see if maintaining/loosing and go from there.

## 2017-04-08 NOTE — Assessment & Plan Note (Signed)
At goal, continue current regimen 

## 2017-04-08 NOTE — Assessment & Plan Note (Addendum)
CBE performed. Lesion right ear looks to be a solar lentigine.  Refer to dermatology for further evaluation as it is in a sun exposed area. Patient is up-to-date on mammogram, colonoscopy.  She is no longer doing Pap smears;  in the absence of any pelvic complaints today, we jointly agreed no pelvic exam.  Screening labs ordered, encourage exercise

## 2017-04-08 NOTE — Progress Notes (Signed)
Subjective:    Patient ID: Christie Stevenson, female    DOB: 01/27/1940, 77 y.o.   MRN: [id number]  CC: Christie Stevenson is a 77 y.o. female who presents today for physical exam.    HPI: Overall felling well.  Notes 'black spot' on right outer ear, 3 months, unchanged.. Hasn't changed. No itchy. No blood.   GERD- not taking prilosec. Symptoms controlled with avoiding trigger foods.   HTN- compliant with medications.Denies exertional chest pain or pressure, numbness or tingling radiating to left arm or jaw, palpitations, dizziness, frequent headaches, changes in vision, or shortness of breath.      Colorectal Cancer Screening: UTD , repeat 2022.  Breast Cancer Screening: Mammogram UTD Cervical Cancer Screening: NO longer doing pap smears . No pelvic pain, vaginal bleeding. No h/o abnormal pap per patient.  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 Had been fosamax several years ago.    Lung Cancer Screening: Doesn't have 30 year pack year history and age > 49 years.       Tetanus - due        Pneumococcal - Candidate for, completed.  Labs: Screening labs today. Exercise: Gets regular exercise.  Alcohol use: No Smoking/tobacco use: Former smoker.  Regular dental exams: In need of dental exam. Wears seat belt: Yes. Skin: no h/o skin cancer. No dermatology.   HISTORY:  Past Medical History:  Diagnosis Date  . 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ease   . Chronic kidney disease    STAGE 3  . Depression   . GERD (gastroesophageal reflux disease)   . History of colon polyps   . History of diverticulitis   . History of kidney stones   . Hyperlipidemia   . Hypertension   . Migraine   . Osteoporosis     Past Surgical History:  Procedure Laterality Date  . CATARACT EXTRACTION Left 2014  . CATARACT EXTRACTION W/PHACO Right 04/01/2015   Procedure: CATARACT EXTRACTION PHACO AND INTRAOCULAR LENS PLACEMENT (IOC);  Surgeon: [name], MD;   Location: [facility] ORS;  Service: Ophthalmology;  Laterality: Right;  [place] 00:47 AP% 20.8 CDE 9.76 fluid pack lot # [id number]  . COLONOSCOPY    . COLONOSCOPY WITH PROPOFOL N/A 05/26/2015   Procedure: COLONOSCOPY WITH PROPOFOL;  Surgeon: [name], MD;  Location: [facility] ENDOSCOPY;  Service: Endoscopy;  Laterality: N/A;  . TUBAL LIGATION     Family History  Problem Relation Age of Onset  . Arthritis Mother   . Hypertension Mother   . Osteoporosis Mother   . Osteoporosis Sister   . Osteoporosis Brother   . Heart disease Brother   . Diabetes Daughter   . Diabetes Son   . Cancer Paternal Uncle        Prostate Cancer  . Cancer Sister        Breast Cancer   . Osteoporosis Sister   . Breast cancer Sister        mid 21's  . Osteoporosis Brother   . Heart disease Brother   . Diabetes Daughter       ALLERGIES: Patient has no known allergies.  Current Outpatient Medications on File Prior to Visit  Medication Sig Dispense Refill  . atorvastatin (LIPITOR) 20 MG tablet TAKE 1 TABLET (20 MG TOTAL) BY MOUTH DAILY. 90 tablet 1  . Calcium Carb-Cholecalciferol (CALCIUM 600 + D PO) Take 600 mg by mouth 2 (two) times daily.    Marland Kitchen latanoprost (XALATAN) 0.005 % ophthalmic  solution Place 1 drop into the right eye at bedtime.    . metoprolol succinate (TOPROL-XL) 25 MG 24 hr tablet TAKE 1 TABLET BY MOUTH EVERY DAY 90 tablet 0  . triamterene-hydrochlorothiazide (MAXZIDE-25) 37.5-25 MG tablet Take 1 tablet by mouth daily. 30 tablet 0   No current facility-administered medications on file prior to visit.     Social History   Tobacco Use  . Smoking status: Former Smoker    Last attempt to quit: 07/21/1988    Years since quitting: 28.7  . Smokeless tobacco: Never Used  Substance Use Topics  . Alcohol use: No    Frequency: Never  . Drug use: No    Review of Systems  Constitutional: Negative for chills, fever and unexpected weight change.  HENT: Negative for congestion.   Respiratory: Negative  for cough.   Cardiovascular: Negative for chest pain, palpitations and leg swelling.  Gastrointestinal: Negative for nausea and vomiting.  Genitourinary: Negative for frequency and pelvic pain.  Musculoskeletal: Negative for arthralgias and myalgias.  Skin: Positive for color change. Negative for rash.  Neurological: Negative for headaches.  Hematological: Negative for adenopathy.  Psychiatric/Behavioral: Negative for confusion.      Objective:    BP 118/76 (BP Location: Left Arm, Patient Position: Sitting, Cuff Size: Normal)   Pulse 67   Temp 98.1 F (36.7 C) (Oral)   Resp 15   Ht 5\' 2"  (1.575 m)   Wt 157 lb 8 oz (71.4 kg)   SpO2 97%   BMI 28.81 kg/m   BP Readings from Last 3 Encounters:  04/08/17 118/76  02/22/17 102/62  05/11/16 128/74   Wt Readings from Last 3 Encounters:  04/08/17 157 lb 8 oz (71.4 kg)  02/22/17 155 lb 12.8 oz (70.7 kg)  05/11/16 160 lb 4 oz (72.7 kg)    Physical Exam  Constitutional: She appears well-developed and well-nourished.  HENT:  Ears:  Hyperpigmented macule noted. Few scales. No bleeding  Eyes: Conjunctivae are normal.  Neck: No thyroid mass and no thyromegaly present.  Cardiovascular: Normal rate, regular rhythm, normal heart sounds and normal pulses.  Pulmonary/Chest: Effort normal and breath sounds normal. She has no wheezes. She has no rhonchi. She has no rales. Right breast exhibits no inverted nipple, no mass, no nipple discharge, no skin change and no tenderness. Left breast exhibits no inverted nipple, no mass, no nipple discharge, no skin change and no tenderness. Breasts are symmetrical.  CBE performed.   Lymphadenopathy:       Head (right side): No submental, no submandibular, no tonsillar, no preauricular, no posterior auricular and no occipital adenopathy present.       Head (left side): No submental, no submandibular, no tonsillar, no preauricular, no posterior auricular and no occipital adenopathy present.    She has no  cervical adenopathy.       Right cervical: No superficial cervical, no deep cervical and no posterior cervical adenopathy present.      Left cervical: No superficial cervical, no deep cervical and no posterior cervical adenopathy present.    She has no axillary adenopathy.  Neurological: She is alert.  Skin: Skin is warm and dry.  Psychiatric: She has a normal mood and affect. Her speech is normal and behavior is normal. Thought content normal.  Vitals reviewed.      Assessment & Plan:   Problem List Items Addressed This Visit      Cardiovascular and Mediastinum   Essential hypertension    At goal, continue current  regimen        Digestive   GERD (gastroesophageal reflux disease)    Controlled with lifestyle measures.  No longer on Prilosec        Musculoskeletal and Integument   Osteoporosis    Reluctant to start medication. Will repeat DEXA and see if maintaining/loosing and go from there.       Relevant Orders   DG Bone Density     Other   Routine general medical examination at a health care facility - Primary    CBE performed. Lesion right ear looks to be a solar lentigine.  Refer to dermatology for further evaluation as it is in a sun exposed area. Patient is up-to-date on mammogram, colonoscopy.  She is no longer doing Pap smears;  in the absence of any pelvic complaints today, we jointly agreed no pelvic exam.  Screening labs ordered, encourage exercise      Relevant Orders   CBC with Differential/Platelet   Comprehensive metabolic panel   Hemoglobin A1c   Lipid panel   TSH   VITAMIN D 25 Hydroxy (Vit-D Deficiency, Fractures)   Ambulatory referral to Dermatology       I have discontinued Darylene Price. Pinette's omeprazole. I am also having her maintain her Calcium Carb-Cholecalciferol (CALCIUM 600 + D PO), latanoprost, atorvastatin, metoprolol succinate, and triamterene-hydrochlorothiazide.   No orders of the defined types were placed in this  encounter.   Return precautions given.   Risks, benefits, and alternatives of the medications and treatment plan prescribed today were discussed, and patient expressed understanding.   Education regarding symptom management and diagnosis given to patient on AVS.   Continue to follow with Burnard Hawthorne, FNP for routine health maintenance.   Christie Stevenson and I agreed with plan.   [name], FNP

## 2017-04-08 NOTE — Assessment & Plan Note (Signed)
Controlled with lifestyle measures.  No longer on Prilosec

## 2017-04-11 ENCOUNTER — Encounter: Payer: Self-pay | Admitting: Family

## 2017-04-11 ENCOUNTER — Other Ambulatory Visit: Payer: Self-pay | Admitting: Family

## 2017-04-11 DIAGNOSIS — E782 Mixed hyperlipidemia: Secondary | ICD-10-CM

## 2017-04-11 NOTE — Progress Notes (Signed)
close

## 2017-04-21 ENCOUNTER — Other Ambulatory Visit: Payer: Self-pay | Admitting: Family

## 2017-05-02 DIAGNOSIS — H40053 Ocular hypertension, bilateral: Secondary | ICD-10-CM | POA: Diagnosis not present

## 2017-06-12 ENCOUNTER — Other Ambulatory Visit: Payer: Self-pay | Admitting: Family

## 2017-07-05 ENCOUNTER — Ambulatory Visit
Admission: RE | Admit: 2017-07-05 | Discharge: 2017-07-05 | Disposition: A | Discharge status: Home / Self Care | Payer: Medicare Other | Source: Ambulatory Visit | Attending: Family | Admitting: Family

## 2017-07-05 DIAGNOSIS — Z780 Asymptomatic menopausal state: Secondary | ICD-10-CM | POA: Diagnosis not present

## 2017-07-05 DIAGNOSIS — M810 Age-related osteoporosis without current pathological fracture: Secondary | ICD-10-CM | POA: Diagnosis not present

## 2017-08-12 ENCOUNTER — Encounter: Payer: Self-pay | Admitting: Family

## 2017-08-15 DIAGNOSIS — R002 Palpitations: Secondary | ICD-10-CM | POA: Diagnosis not present

## 2017-08-15 NOTE — Telephone Encounter (Signed)
Called and spoke with patient she states 2 weeks ago when she flew to [place] noticed irregular heartbeats.  She also felt lightheaded  Called and spoke with patient today she denies feeling lightheaded or dizzy.   She is currently on Metoprolol to manage her heart rate. I asked patient to count her heart rate for 30 seconds and she did she stated that it was 56 and beats felt irregular.  I advised patient that there were no appointment available in our office today and she needed to be evaluated today. Patient was advised of local urgent cares in area.  She wants to go to [facility].

## 2017-08-17 NOTE — Telephone Encounter (Signed)
Christie Stevenson  Call pt and plesae ensure pt has been to ED for palpitations at [facility].   Also I sent her a Pharmacist, community

## 2017-08-19 ENCOUNTER — Ambulatory Visit (INDEPENDENT_AMBULATORY_CARE_PROVIDER_SITE_OTHER): Payer: Medicare Other | Admitting: Family

## 2017-08-19 ENCOUNTER — Encounter: Payer: Self-pay | Admitting: Family

## 2017-08-19 VITALS — BP 122/68 | HR 67 | Temp 98.0°F | Ht 62.0 in | Wt 161.2 lb

## 2017-08-19 DIAGNOSIS — K219 Gastro-esophageal reflux disease without esophagitis: Secondary | ICD-10-CM | POA: Diagnosis not present

## 2017-08-19 DIAGNOSIS — I499 Cardiac arrhythmia, unspecified: Secondary | ICD-10-CM

## 2017-08-19 DIAGNOSIS — R5383 Other fatigue: Secondary | ICD-10-CM | POA: Diagnosis not present

## 2017-08-19 DIAGNOSIS — I10 Essential (primary) hypertension: Secondary | ICD-10-CM

## 2017-08-19 DIAGNOSIS — M810 Age-related osteoporosis without current pathological fracture: Secondary | ICD-10-CM | POA: Diagnosis not present

## 2017-08-19 MED ORDER — ALENDRONATE SODIUM 70 MG PO TABS: 70.0000 mg | ORAL_TABLET | ORAL | 11 refills | Status: DC

## 2017-08-19 NOTE — Assessment & Plan Note (Signed)
Well-controlled.  Continue current regimen. 

## 2017-08-19 NOTE — Assessment & Plan Note (Signed)
Chronic, unchanged.  Discussed with patient this is likely multifactorial including  lack of exercise.  Since she has a history of snoring, pursuing sleep study.  Will follow

## 2017-08-19 NOTE — Assessment & Plan Note (Signed)
Poor control.  Education provided on lifestyle modifications.  She will also add short course of Zantac along with her Prilosec.  She will follow-up in 1 month.  No red flag symptoms at this time however we jointly agreed she may need to see GI for endoscope.  She is a former smoker.  Will follow

## 2017-08-19 NOTE — Assessment & Plan Note (Addendum)
RRR on today. Unchanged since emergency room visit.  Etiology unclear at this time. Discussed with patient that it is most appropriate that she sees cardiology.  In the interim, patient will remain hypervigilant, with any new or worsening symptoms, patient will return to emergency room.  Will follow

## 2017-08-19 NOTE — Patient Instructions (Addendum)
Since reflux is not well controlled, trial of OTC zantac in addition to prilosec. Really focus on giving yourself two hours prior to eating at bedtime.  Long term use beyond 3 months of proton pump inhibitors , also called PPI's, is associated with malabsorption of vitamins, chronic kidney disease, fracture risk, and diarrheal illnesses. PPI's include Nexium, Prilosec, Protonix, Dexilant, and Prevacid.   I generally recommend trying to control acid reflux with lifestyle modifications including avoiding trigger foods, not eating 2 hours prior to bedtime. You may use histamine 2 blockers daily to twice daily ( this is Zantac, Pepcid) and then when symptoms flare, start back on PPI for short course.   Of note, we will need to do an endoscopy ( upper GI) to evaluate your esophagus, stomach in the future if acid reflux persists are you develop red flag symptoms: trouble swallowing, hoarseness, chronic cough, unexplained weight loss.   Monitor palpitations. Let me know if worsen or certainly you develop scary symptoms- chest pain, left arm pain, shortness of breath.   Today we discussed referrals, orders. Cardiology, sleep study   I have placed these orders in the system for you.  Please be sure to give [place] a call if you have not heard from our office regarding scheduling a test or regarding referral in a timely manner.  It is very important that you let me know as soon as possible.   Start fosamax. Would pt be willing to start fosamax?  She would be UNABLE  to take if history of esophageal stricture, trouble swallowing, if her kidney function worsens  Medication can be associated with atypical hip fracture and jaw necrosis. If any dental work in the new future, advise her not to start medication. If she has concerns with possible side effects of medication, we can discuss in OV.     Start Fosamax ( alendronate).   This medication is a biphosphonate to help preserve/maintain bone loss. It reduces bone  turnover.   Take Fosamax with water, 30 minutes prior to first food/drink/medication. Avoid laying down for 30 minutes.  Stay on for about 5 years and then we reassess if need to take a 'drug holiday'  For post menopausal women, guidelines recommend a diet with 1200 mg of Calcium per day. If you are eating calcium rich foods, you do not need a calcium supplement. The body better absorbs the calcium that you eat over supplementation. If you do supplement, I recommend not supplementing the full 1200 mg/ day as this can lead to increased risk of cardiovascular disease. I recommend Calcium Citrate over the counter, and you may take a total of 600 to 800 mg per day in divided doses with meals for best absorption.   For bone health, you need adequate vitamin D, and I recommend you supplement as it is harder to do so with diet alone. I recommend cholecalciferol 800 units daily.  Also, please ensure you are following a diet high in calcium -- research shows better outcomes with dietary sources including kale, yogurt, broccolii, cheese, okra, almonds- to name a few.     Also remember that exercise is a great medicine for maintain and preserve bone health. Advise moderate exercise for 30 minutes , 3 times per week.

## 2017-08-19 NOTE — Progress Notes (Signed)
Subjective:    Patient ID: Christie Stevenson, female    DOB: [birth year], 77 y.o.   MRN: [id number]  CC: [name] is a 77 y.o. female who presents today for follow up.   HPI: Osteoporosis- had been fosamax for 8 years however had at that time , had a one tooth was 'loose'. Never told she had jaw necrosis. No jaw pain.  Insurance will not cover Prolia.   No planned dental procedures. No trouble swallowing.   Palpitations for a couple of weeks, or at least when she noticed it.  occurs daily, multiple times of day. was in [place] for baby shower with daughter when first started  'felt like on the go the whole trip' when started. Laying in the bed, felt pulse and it seemed irregular.   No syncope, chest pain, dizziness. No anxiety.   Had a stress test 20 years ago in [place]. Unsure of results.   Drinking more water however cannot tell a difference.   Does note fatigue for a couple of years, unchanged. Doesn't exercise.   Snores.   GERD- taking prilosec every day.  Sweet foods are trigger. Seems to be worse at night when laying down. Describes burning in throat. No bad taste in mouth. No belching, abdominal pain. Quit smoking 20 years ago.  NO trouble swallowing, hoarseness.      ED 08/15/17 - EKG normal. Normal troponin. TSH normal. CXR normal. ED suspected EDs. Hgb 12.9.   HISTORY:  Past Medical History:  Diagnosis Date  . 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ease   . Chronic kidney disease    STAGE 3  . Depression   . GERD (gastroesophageal reflux disease)   . History of colon polyps   . History of diverticulitis   . History of kidney stones   . Hyperlipidemia   . Hypertension   . Migraine   . Osteoporosis    Past Surgical History:  Procedure Laterality Date  . CATARACT EXTRACTION Left 2014  . CATARACT EXTRACTION W/PHACO Right 04/01/2015   Procedure: CATARACT EXTRACTION PHACO AND INTRAOCULAR LENS PLACEMENT (IOC);  Surgeon: [name], MD;  Location:  [facility] ORS;  Service: Ophthalmology;  Laterality: Right;  [place] 00:47 AP% 20.8 CDE 9.76 fluid pack lot # [id number]  . COLONOSCOPY    . COLONOSCOPY WITH PROPOFOL N/A 05/26/2015   Procedure: COLONOSCOPY WITH PROPOFOL;  Surgeon: [name], MD;  Location: [facility] ENDOSCOPY;  Service: Endoscopy;  Laterality: N/A;  . TUBAL LIGATION     Family History  Problem Relation Age of Onset  . Arthritis Mother   . Hypertension Mother   . Osteoporosis Mother   . Osteoporosis Sister   . Osteoporosis Brother   . Heart disease Brother   . Diabetes Daughter   . Diabetes Son   . Cancer Paternal Uncle        Prostate Cancer  . Cancer Sister        Breast Cancer   . Osteoporosis Sister   . Breast cancer Sister        mid 60's  . Osteoporosis Brother   . Heart disease Brother   . Diabetes Daughter     Allergies: Patient has no known allergies. Current Outpatient Medications on File Prior to Visit  Medication Sig Dispense Refill  . atorvastatin (LIPITOR) 40 MG tablet TAKE 1 TABLET (20 MG TOTAL) BY MOUTH DAILY. 90 tablet 1  . Calcium Carb-Cholecalciferol (CALCIUM 600 + D PO) Take 600 mg by mouth 2 (  two) times daily.    Marland Kitchen latanoprost (XALATAN) 0.005 % ophthalmic solution Place 1 drop into the right eye at bedtime.    . metoprolol succinate (TOPROL-XL) 25 MG 24 hr tablet TAKE 1 TABLET BY MOUTH EVERY DAY 90 tablet 0  . omeprazole (PRILOSEC) 20 MG capsule Take 20 mg by mouth daily.    Marland Kitchen triamterene-hydrochlorothiazide (MAXZIDE-25) 37.5-25 MG tablet Take 1 tablet by mouth daily. 30 tablet 0  . triamterene-hydrochlorothiazide (MAXZIDE-25) 37.5-25 MG tablet TAKE 1 TABLET BY MOUTH EVERY DAY 90 tablet 0   No current facility-administered medications on file prior to visit.     Social History   Tobacco Use  . Smoking status: Former Smoker    Last attempt to quit: 07/21/1988    Years since quitting: 29.0  . Smokeless tobacco: Never Used  Substance Use Topics  . Alcohol use: No    Frequency: Never  . Drug  use: No    Review of Systems  Constitutional: Negative for chills and fever.  Respiratory: Negative for cough.   Cardiovascular: Positive for palpitations. Negative for chest pain.  Gastrointestinal: Negative for abdominal distention, abdominal pain, nausea and vomiting.      Objective:    BP 122/68 (BP Location: Left Arm, Patient Position: Sitting, Cuff Size: Normal)   Pulse 67   Temp 98 F (36.7 C) (Oral)   Ht 5\' 2"  (1.575 m)   Wt 161 lb 4 oz (73.1 kg)   SpO2 97%   BMI 29.49 kg/m  BP Readings from Last 3 Encounters:  08/19/17 122/68  04/08/17 118/76  02/22/17 102/62   Wt Readings from Last 3 Encounters:  08/19/17 161 lb 4 oz (73.1 kg)  04/08/17 157 lb 8 oz (71.4 kg)  02/22/17 155 lb 12.8 oz (70.7 kg)    Physical Exam  Constitutional: She appears well-developed and well-nourished.  Eyes: Conjunctivae are normal.  Cardiovascular: Normal rate, regular rhythm, normal heart sounds and normal pulses.  Pulmonary/Chest: Effort normal and breath sounds normal. She has no wheezes. She has no rhonchi. She has no rales.  Neurological: She is alert.  Skin: Skin is warm and dry.  Psychiatric: She has a normal mood and affect. Her speech is normal and behavior is normal. Thought content normal.  Vitals reviewed.      Assessment & Plan:   Problem List Items Addressed This Visit      Cardiovascular and Mediastinum   Essential hypertension    Well-controlled.  Continue current regimen.        Digestive   Gastroesophageal reflux disease    Poor control.  Education provided on lifestyle modifications.  She will also add short course of Zantac along with her Prilosec.  She will follow-up in 1 month.  No red flag symptoms at this time however we jointly agreed she may need to see GI for endoscope.  She is a former smoker.  Will follow      Relevant Medications   omeprazole (PRILOSEC) 20 MG capsule     Musculoskeletal and Integument   Osteoporosis    Insurance did not  improve Prolia per patient.  She has been on Fosamax in the past with improvement.  We will go ahead and start Fosamax today.  Long discussion regarding rare however worsen side effects including jaw necrosis, atypical fractures.  Patient is aware of these.  Prescription Fosamax given.      Relevant Medications   alendronate (FOSAMAX) 70 MG tablet     Other   Other fatigue  Chronic, unchanged.  Discussed with patient this is likely multifactorial including  lack of exercise.  Since she has a history of snoring, pursuing sleep study.  Will follow      Relevant Orders   Ambulatory referral to Sleep Studies   Irregular heart beat - Primary    RRR on today. Unchanged since emergency room visit.  Etiology unclear at this time. Discussed with patient that it is most appropriate that she sees cardiology.  In the interim, patient will remain hypervigilant, with any new or worsening symptoms, patient will return to emergency room.  Will follow      Relevant Orders   Ambulatory referral to Cardiology       I am having Darylene Price. Guirguis start on alendronate. I am also having her maintain her Calcium Carb-Cholecalciferol (CALCIUM 600 + D PO), latanoprost, triamterene-hydrochlorothiazide, atorvastatin, triamterene-hydrochlorothiazide, metoprolol succinate, and omeprazole.   Meds ordered this encounter  Medications  . alendronate (FOSAMAX) 70 MG tablet    Sig: Take 1 tablet (70 mg total) by mouth every 7 (seven) days. Take with a full glass of water on an empty stomach.    Dispense:  4 tablet    Refill:  11    Order Specific Question:   Supervising Provider    Answer:   Crecencio Mc [[id number]]    Return precautions given.   Risks, benefits, and alternatives of the medications and treatment plan prescribed today were discussed, and patient expressed understanding.   Education regarding symptom management and diagnosis given to patient on AVS.  Continue to follow with Burnard Hawthorne,  FNP for routine health maintenance.   Christie Stevenson and I agreed with plan.   [name], FNP

## 2017-08-19 NOTE — Assessment & Plan Note (Signed)
Insurance did not improve Prolia per patient.  She has been on Fosamax in the past with improvement.  We will go ahead and start Fosamax today.  Long discussion regarding rare however worsen side effects including jaw necrosis, atypical fractures.  Patient is aware of these.  Prescription Fosamax given.

## 2017-09-01 ENCOUNTER — Encounter: Payer: Self-pay | Admitting: Internal Medicine

## 2017-09-01 ENCOUNTER — Ambulatory Visit (INDEPENDENT_AMBULATORY_CARE_PROVIDER_SITE_OTHER): Payer: Medicare Other | Admitting: Internal Medicine

## 2017-09-01 VITALS — BP 120/80 | HR 92 | Ht 62.0 in | Wt 160.0 lb

## 2017-09-01 DIAGNOSIS — G4719 Other hypersomnia: Secondary | ICD-10-CM | POA: Diagnosis not present

## 2017-09-01 NOTE — Progress Notes (Signed)
Name: Christie Stevenson MRN: [id number] DOB: Jul 03, 1940     CONSULTATION DATE: 8.15.19 REFERRING MD : arnette  CHIEF COMPLAINT: fatigue  STUDIES:  No current CT scans or CXR's   HISTORY OF PRESENT ILLNESS: 77 year old pleasant white female seen today for excessive daytime sleepiness along with extreme fatigue Patient states this been going on for many years She has restless sleep Non-refreshed sleep  Patient also has GERD Being treated with Prilosec which has helped tremendously Patient is a former smoker 1/2 packs/day for the last 30 years Quit 25 years ago Occasional alcohol use   Patient has been having excessive daytime sleepiness Patient has been having extreme fatigue and tiredness, lack of energy +  very Loud snoring every night + struggling breathe at night and gasps for air  EPWORTH SLEEP SCORE 6   No signs of infection at this time No signs of heart failure at this time  Patient has had irregular heartbeats and has been seen by [facility] cardiology in the past  Sleep apnea definition explained to patient    PAST MEDICAL HISTORY :   has a past medical history of 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ease, Chronic kidney disease, Depression, GERD (gastroesophageal reflux disease), History of colon polyps, History of diverticulitis, History of kidney stones, Hyperlipidemia, Hypertension, Migraine, and Osteoporosis.  has a past surgical history that includes Cataract extraction (Left, 2014); Tubal ligation; Colonoscopy; Cataract extraction w/PHACO (Right, 04/01/2015); and Colonoscopy with propofol (N/A, 05/26/2015). Prior to Admission medications   Medication Sig Start Date End Date Taking? Authorizing Provider  alendronate (FOSAMAX) 70 MG tablet Take 1 tablet (70 mg total) by mouth every 7 (seven) days. Take with a full glass of water on an empty stomach. 08/19/17   Burnard Hawthorne, FNP  atorvastatin (LIPITOR) 40 MG tablet TAKE 1 TABLET (20 MG TOTAL) BY MOUTH  DAILY. 04/11/17   Burnard Hawthorne, FNP  Calcium Carb-Cholecalciferol (CALCIUM 600 + D PO) Take 600 mg by mouth 2 (two) times daily.    [provider]  latanoprost (XALATAN) 0.005 % ophthalmic solution Place 1 drop into the right eye at bedtime.    [provider]  metoprolol succinate (TOPROL-XL) 25 MG 24 hr tablet TAKE 1 TABLET BY MOUTH EVERY DAY 06/14/17   Burnard Hawthorne, FNP  omeprazole (PRILOSEC) 20 MG capsule Take 20 mg by mouth daily.    [provider]  triamterene-hydrochlorothiazide (MAXZIDE-25) 37.5-25 MG tablet Take 1 tablet by mouth daily. 03/16/17   Burnard Hawthorne, FNP  triamterene-hydrochlorothiazide (MAXZIDE-25) 37.5-25 MG tablet TAKE 1 TABLET BY MOUTH EVERY DAY 06/14/17   Burnard Hawthorne, FNP   No Known Allergies  FAMILY HISTORY:  family history includes Arthritis in her mother; Breast cancer in her sister; Cancer in her paternal uncle and sister; Diabetes in her daughter, daughter, and son; Heart disease in her brother and brother; Hypertension in her mother; Osteoporosis in her brother, brother, mother, sister, and sister. SOCIAL HISTORY:  reports that she quit smoking about 29 years ago. She has never used smokeless tobacco. She reports that she does not drink alcohol or use drugs.  REVIEW OF SYSTEMS:   Constitutional: Negative for fever, chills, weight loss, +malaise/fatigue HENT: Negative for hearing loss, ear pain, nosebleeds, congestion, sore throat, neck pain, tinnitus and ear discharge.   Eyes: Negative for blurred vision, double vision, photophobia, pain, discharge and redness.  Respiratory: Negative for cough, hemoptysis, sputum production, shortness of breath, wheezing and stridor.   Cardiovascular: Negative for  chest pain, palpitations, orthopnea, claudication, leg swelling and PND.  Gastrointestinal: Negative for heartburn, nausea, vomiting, abdominal pain, diarrhea, constipation, blood in stool and melena.  Genitourinary:  Negative for dysuria, urgency, frequency, hematuria and flank pain.  Musculoskeletal: Negative for myalgias, back pain, joint pain and falls.  Skin: Negative for itching and rash.  Neurological: Negative for dizziness, tingling, tremors, sensory change, speech change, focal weakness, seizures, loss of consciousness, weakness and headaches.  Endo/Heme/Allergies: Negative for environmental allergies and polydipsia. Does not bruise/bleed easily.  ALL OTHER ROS ARE NEGATIVE   Ht 5\' 2"  (1.575 m)   Wt 160 lb (72.6 kg)   BMI 29.26 kg/m    Physical Examination:   GENERAL:NAD, no fevers, chills, no weakness + fatigue HEAD: Normocephalic, atraumatic.  EYES: Pupils equal, round, reactive to light. Extraocular muscles intact. No scleral icterus.  MOUTH: Moist mucosal membrane.   EAR, NOSE, THROAT: Clear without exudates. No external lesions.  NECK: Supple. No thyromegaly. No nodules. No JVD.  PULMONARY:CTA B/L no wheezes, no crackles, no rhonchi CARDIOVASCULAR: S1 and S2. Regular rate and rhythm. No murmurs, rubs, or gallops. No edema.  GASTROINTESTINAL: Soft, nontender, nondistended. No masses. Positive bowel sounds.  MUSCULOSKELETAL: No swelling, clubbing, or edema. Range of motion full in all extremities.  NEUROLOGIC: Cranial nerves II through XII are intact. No gross focal neurological deficits.  SKIN: No ulceration, lesions, rashes, or cyanosis. Skin warm and dry. Turgor intact.  PSYCHIATRIC: Mood, affect within normal limits. The patient is awake, alert and oriented x 3. Insight, judgment intact.      ASSESSMENT / PLAN: 77 year old pleasant white female seen today for excessive daytime sleepiness with snoring with gasping for air most likely finding consistent with sleep apnea patient will need sleep study to establish diagnosis  #1 obtain sleep study #2 recommend diet and exercise  Follow-up after test completed  Patient  satisfied with Plan of action and management. All questions  answered  Corrin Parker, M.D.  [facility]  Medical Director [place] Director [facility] Cardio-Pulmonary Department

## 2017-09-01 NOTE — Patient Instructions (Signed)
Patient needs sleep study to assess for sleep apnea

## 2017-09-07 ENCOUNTER — Other Ambulatory Visit: Payer: Self-pay | Admitting: Family

## 2017-09-21 ENCOUNTER — Ambulatory Visit: Payer: Medicare Other | Admitting: Family

## 2017-09-21 DIAGNOSIS — Z0289 Encounter for other administrative examinations: Secondary | ICD-10-CM

## 2017-09-26 ENCOUNTER — Encounter: Payer: Self-pay | Admitting: Family

## 2017-09-26 ENCOUNTER — Ambulatory Visit (INDEPENDENT_AMBULATORY_CARE_PROVIDER_SITE_OTHER): Payer: Medicare Other | Admitting: Family

## 2017-09-26 VITALS — BP 114/70 | HR 62 | Temp 98.0°F | Resp 16 | Wt 161.5 lb

## 2017-09-26 DIAGNOSIS — G629 Polyneuropathy, unspecified: Secondary | ICD-10-CM | POA: Diagnosis not present

## 2017-09-26 DIAGNOSIS — Z23 Encounter for immunization: Secondary | ICD-10-CM | POA: Diagnosis not present

## 2017-09-26 DIAGNOSIS — K219 Gastro-esophageal reflux disease without esophagitis: Secondary | ICD-10-CM

## 2017-09-26 NOTE — Progress Notes (Signed)
Subjective:    Patient ID: Christie Stevenson, female    DOB: [birth year], 77 y.o.   MRN: [id number]  CC: Christie Stevenson is a 77 y.o. female who presents today for follow up.   HPI: CC: numbness in bilateral fingers, 2 weeks.  Numbness 4th and 5th tips of fingers in both hands. Affecting strength and hard to clip fingernails for instance. No neck pain, vision changes, HA.   Ironing, on computer however these activties are limited.   Has occasionally had numbness in toes in the past, less bothersome.   TSH- normal 03/2017.   GERD- Improved. added zantac qam with prilosec  Palpitations - None at this time. Cardiology appt 10/19/17       Past Medical History:  Diagnosis Date  . 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ease   . Chronic kidney disease    STAGE 3  . Depression   . GERD (gastroesophageal reflux disease)   . History of colon polyps   . History of diverticulitis   . History of kidney stones   . Hyperlipidemia   . Hypertension   . Migraine   . Osteoporosis    Past Surgical History:  Procedure Laterality Date  . CATARACT EXTRACTION Left 2014  . CATARACT EXTRACTION W/PHACO Right 04/01/2015   Procedure: CATARACT EXTRACTION PHACO AND INTRAOCULAR LENS PLACEMENT (IOC);  Surgeon: [name], MD;  Location: [facility] ORS;  Service: Ophthalmology;  Laterality: Right;  [place] 00:47 AP% 20.8 CDE 9.76 fluid pack lot # [id number]  . COLONOSCOPY    . COLONOSCOPY WITH PROPOFOL N/A 05/26/2015   Procedure: COLONOSCOPY WITH PROPOFOL;  Surgeon: [name], MD;  Location: [facility] ENDOSCOPY;  Service: Endoscopy;  Laterality: N/A;  . TUBAL LIGATION     Family History  Problem Relation Age of Onset  . Arthritis Mother   . Hypertension Mother   . Osteoporosis Mother   . Osteoporosis Sister   . Osteoporosis Brother   . Heart disease Brother   . Diabetes Daughter   . Diabetes Son   . Cancer Paternal Uncle        Prostate Cancer  . Cancer Sister        Breast Cancer   .  Osteoporosis Sister   . Breast cancer Sister        mid 60's  . Osteoporosis Brother   . Heart disease Brother   . Diabetes Daughter     Allergies: Patient has no known allergies. Current Outpatient Medications on File Prior to Visit  Medication Sig Dispense Refill  . alendronate (FOSAMAX) 70 MG tablet Take 1 tablet (70 mg total) by mouth every 7 (seven) days. Take with a full glass of water on an empty stomach. 4 tablet 11  . atorvastatin (LIPITOR) 40 MG tablet TAKE 1 TABLET (20 MG TOTAL) BY MOUTH DAILY. 90 tablet 1  . Calcium Carb-Cholecalciferol (CALCIUM 600 + D PO) Take 600 mg by mouth 2 (two) times daily.    Marland Kitchen latanoprost (XALATAN) 0.005 % ophthalmic solution Place 1 drop into the right eye at bedtime.    . metoprolol succinate (TOPROL-XL) 25 MG 24 hr tablet TAKE 1 TABLET BY MOUTH EVERY DAY 90 tablet 0  . omeprazole (PRILOSEC) 20 MG capsule Take 20 mg by mouth daily.    Marland Kitchen triamterene-hydrochlorothiazide (MAXZIDE-25) 37.5-25 MG tablet Take 1 tablet by mouth daily. 30 tablet 0   No current facility-administered medications on file prior to visit.     Social History   Tobacco Use  .  Smoking status: Former Smoker    Last attempt to quit: 07/21/1988    Years since quitting: 29.2  . Smokeless tobacco: Never Used  Substance Use Topics  . Alcohol use: No    Frequency: Never  . Drug use: No    Review of Systems  Constitutional: Negative for chills and fever.  Respiratory: Negative for cough.   Cardiovascular: Negative for chest pain and palpitations.  Gastrointestinal: Negative for nausea and vomiting.  Neurological: Positive for numbness.      Objective:    BP 114/70 (BP Location: Left Arm, Patient Position: Sitting, Cuff Size: Normal)   Pulse 62   Temp 98 F (36.7 C) (Oral)   Resp 16   Wt 161 lb 8 oz (73.3 kg)   SpO2 96%   BMI 29.54 kg/m  BP Readings from Last 3 Encounters:  09/26/17 114/70  09/01/17 120/80  08/19/17 122/68   Wt Readings from Last 3 Encounters:    09/26/17 161 lb 8 oz (73.3 kg)  09/01/17 160 lb (72.6 kg)  08/19/17 161 lb 4 oz (73.1 kg)    Physical Exam  Constitutional: She appears well-developed and well-nourished.  Eyes: Conjunctivae are normal.  Cardiovascular: Normal rate, regular rhythm, normal heart sounds and normal pulses.  Pulmonary/Chest: Effort normal and breath sounds normal. She has no wheezes. She has no rhonchi. She has no rales.  Musculoskeletal:       Hands: Neurological: She is alert.  Grip strength normal Sensation intact over all metacarpals.   Skin: Skin is warm and dry.  Psychiatric: She has a normal mood and affect. Her speech is normal and behavior is normal. Thought content normal.  Vitals reviewed.      Assessment & Plan:   Problem List Items Addressed This Visit      Digestive   Gastroesophageal reflux disease    Improved. Education provided on long term use of ppi        Nervous and Auditory   Neuropathy - Primary    New, 2 weeks. Working diagnosis of ulnar nerve entrapment  Pending lab evaluation for nutritional deficiency.  Will follow      Relevant Orders   B12 and Folate Panel (Completed)   CBC with Differential/Platelet (Completed)   Comprehensive metabolic panel (Completed)   Hemoglobin A1c (Completed)   VITAMIN D 25 Hydroxy (Vit-D Deficiency, Fractures) (Completed)    Other Visit Diagnoses    Encounter for immunization       Relevant Orders   Flu vaccine HIGH DOSE PF (Completed)       I am having Darylene Price. Bartram maintain her Calcium Carb-Cholecalciferol (CALCIUM 600 + D PO), latanoprost, triamterene-hydrochlorothiazide, atorvastatin, alendronate, omeprazole, and metoprolol succinate.   No orders of the defined types were placed in this encounter.   Return precautions given.   Risks, benefits, and alternatives of the medications and treatment plan prescribed today were discussed, and patient expressed understanding.   Education regarding symptom management and  diagnosis given to patient on AVS.  Continue to follow with Burnard Hawthorne, FNP for routine health maintenance.   Christie Stevenson and I agreed with plan.   [name], FNP

## 2017-09-26 NOTE — Patient Instructions (Addendum)
   Labs today  Let me know if numbness, tingling were to worsen and certainly if persists   Will await cardiology consult; in the interim, please stay very vigilant and return of palpitations, please call 911 or go to emergency room   Long term use beyond 3 months of proton pump inhibitors , also called PPI's, is associated with malabsorption of vitamins, chronic kidney disease, fracture risk, and diarrheal illnesses. PPI's include Nexium, Prilosec, Protonix, Dexilant, and Prevacid.   I generally recommend trying to control acid reflux with lifestyle modifications including avoiding trigger foods, not eating 2 hours prior to bedtime. You may use histamine 2 blockers daily to twice daily ( this is Zantac, Pepcid) and then when symptoms flare, start back on PPI for short course.   Of note, we will need to do an endoscopy ( upper GI) to evaluate your esophagus, stomach in the future if acid reflux persists are you develop red flag symptoms: trouble swallowing, hoarseness, chronic cough, unexplained weight loss.

## 2017-09-27 ENCOUNTER — Encounter: Payer: Self-pay | Admitting: Internal Medicine

## 2017-09-27 DIAGNOSIS — G4719 Other hypersomnia: Secondary | ICD-10-CM

## 2017-09-27 LAB — COMPREHENSIVE METABOLIC PANEL
ALT: 13 U/L (ref 0–35)
AST: 15 U/L (ref 0–37)
Albumin: 3.9 g/dL (ref 3.5–5.2)
Alkaline Phosphatase: 110 U/L (ref 39–117)
BILIRUBIN TOTAL: 0.6 mg/dL (ref 0.2–1.2)
BUN: 22 mg/dL (ref 6–23)
CO2: 30 meq/L (ref 19–32)
Calcium: 10 mg/dL (ref 8.4–10.5)
Chloride: 102 mEq/L (ref 96–112)
Creatinine, Ser: 1.16 mg/dL (ref 0.40–1.20)
GFR: 48.25 mL/min — AB (ref >60.00)
GLUCOSE: 92 mg/dL (ref 70–99)
Potassium: 3.5 mEq/L (ref 3.5–5.1)
SODIUM: 139 meq/L (ref 135–145)
Total Protein: 7.3 g/dL (ref 6.0–8.3)

## 2017-09-27 LAB — B12 AND FOLATE PANEL
Folate: 16.3 ng/mL (ref >5.9)
Vitamin B-12: >1500 pg/mL — ABNORMAL HIGH (ref 211–911)

## 2017-09-27 LAB — CBC WITH DIFFERENTIAL/PLATELET
BASOS ABS: 0.1 10*3/uL (ref 0.0–0.1)
BASOS PCT: 1.1 % (ref 0.0–3.0)
EOS ABS: 0.1 10*3/uL (ref 0.0–0.7)
EOS PCT: 1 % (ref 0.0–5.0)
HCT: 39 % (ref 36.0–46.0)
Hemoglobin: 12.7 g/dL (ref 12.0–15.0)
LYMPHS PCT: 21.1 % (ref 12.0–46.0)
Lymphs Abs: 1.6 10*3/uL (ref 0.7–4.0)
MCHC: 32.6 g/dL (ref 30.0–36.0)
MCV: 83.1 fl (ref 78.0–100.0)
MONOS PCT: 7.1 % (ref 3.0–12.0)
Monocytes Absolute: 0.6 10*3/uL (ref 0.1–1.0)
NEUTROS PCT: 69.7 % (ref 43.0–77.0)
Neutro Abs: 5.4 10*3/uL (ref 1.4–7.7)
PLATELETS: 297 10*3/uL (ref 150.0–400.0)
RBC: 4.69 Mil/uL (ref 3.87–5.11)
RDW: 14.7 % (ref 11.5–15.5)
WBC: 7.7 10*3/uL (ref 4.0–10.5)

## 2017-09-27 LAB — VITAMIN D 25 HYDROXY (VIT D DEFICIENCY, FRACTURES): VITD: 17.84 ng/mL — ABNORMAL LOW (ref 30.00–100.00)

## 2017-09-27 LAB — HEMOGLOBIN A1C: Hgb A1c MFr Bld: 6 % (ref 4.6–6.5)

## 2017-09-28 DIAGNOSIS — G4733 Obstructive sleep apnea (adult) (pediatric): Secondary | ICD-10-CM

## 2017-09-28 DIAGNOSIS — G629 Polyneuropathy, unspecified: Secondary | ICD-10-CM | POA: Insufficient documentation

## 2017-09-28 NOTE — Assessment & Plan Note (Signed)
Improved. Education provided on long term use of ppi

## 2017-09-28 NOTE — Assessment & Plan Note (Addendum)
New, 2 weeks. Working diagnosis of ulnar nerve entrapment  Pending lab evaluation for nutritional deficiency.  Will follow

## 2017-09-30 ENCOUNTER — Telehealth: Payer: Self-pay | Admitting: *Deleted

## 2017-09-30 DIAGNOSIS — G4733 Obstructive sleep apnea (adult) (pediatric): Secondary | ICD-10-CM

## 2017-09-30 NOTE — Telephone Encounter (Signed)
Attempted to call both # listed in chart. 1 has no vmail and the other is not in service. Calling with HST results. 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 Recommend in-lab CPAP titration study.

## 2017-10-03 NOTE — Telephone Encounter (Signed)
Patient aware titration study ordered.

## 2017-10-05 ENCOUNTER — Ambulatory Visit: Payer: Medicare Other | Attending: Internal Medicine

## 2017-10-05 DIAGNOSIS — G4733 Obstructive sleep apnea (adult) (pediatric): Secondary | ICD-10-CM | POA: Insufficient documentation

## 2017-10-07 ENCOUNTER — Encounter: Payer: Self-pay | Admitting: Family

## 2017-10-07 DIAGNOSIS — G4733 Obstructive sleep apnea (adult) (pediatric): Secondary | ICD-10-CM

## 2017-10-12 ENCOUNTER — Telehealth: Payer: Self-pay

## 2017-10-12 DIAGNOSIS — G4733 Obstructive sleep apnea (adult) (pediatric): Secondary | ICD-10-CM

## 2017-10-12 NOTE — Telephone Encounter (Signed)
Called and spoke to patient regarding her titration study results. Notified her of recommendation of BiPap. DME order placed and patient aware she will get another call to set up BiPap. No further questions at this time.

## 2017-10-17 ENCOUNTER — Telehealth: Payer: Self-pay | Admitting: Cardiovascular Disease

## 2017-10-17 NOTE — Telephone Encounter (Signed)
L MOM TO R/S 10/2 APPOINTMENT TO 10/11 AT 10:40 WITH DR END.

## 2017-10-19 ENCOUNTER — Encounter

## 2017-10-19 ENCOUNTER — Ambulatory Visit: Payer: Medicare Other | Admitting: Cardiovascular Disease

## 2017-10-28 ENCOUNTER — Ambulatory Visit (INDEPENDENT_AMBULATORY_CARE_PROVIDER_SITE_OTHER): Payer: Medicare Other

## 2017-10-28 ENCOUNTER — Ambulatory Visit: Payer: Medicare Other | Admitting: Internal Medicine

## 2017-10-28 ENCOUNTER — Encounter: Payer: Self-pay | Admitting: Internal Medicine

## 2017-10-28 VITALS — BP 116/66 | HR 58 | Ht 62.0 in | Wt 162.4 lb

## 2017-10-28 DIAGNOSIS — I6523 Occlusion and stenosis of bilateral carotid arteries: Secondary | ICD-10-CM

## 2017-10-28 DIAGNOSIS — I493 Ventricular premature depolarization: Secondary | ICD-10-CM | POA: Diagnosis not present

## 2017-10-28 DIAGNOSIS — E785 Hyperlipidemia, unspecified: Secondary | ICD-10-CM

## 2017-10-28 DIAGNOSIS — R002 Palpitations: Secondary | ICD-10-CM | POA: Diagnosis not present

## 2017-10-28 DIAGNOSIS — I10 Essential (primary) hypertension: Secondary | ICD-10-CM | POA: Diagnosis not present

## 2017-10-28 NOTE — Patient Instructions (Signed)
Medication Instructions:  Your physician recommends that you continue on your current medications as directed. Please refer to the Current Medication list given to you today.  If you need a refill on your cardiac medications before your next appointment, please call your pharmacy.   Lab work: none If you have labs (blood work) drawn today and your tests are completely normal, you will receive your results only by: Marland Kitchen MyChart Message (if you have MyChart) OR . A paper copy in the mail If you have any lab test that is abnormal or we need to change your treatment, we will call you to review the results.  Testing/Procedures: Your physician has recommended that you wear an 14 DAY ZIO event monitor. Event monitors are medical devices that record the heart's electrical activity. Doctors most often [place] these monitors to diagnose arrhythmias. Arrhythmias are problems with the speed or rhythm of the heartbeat. The monitor is a small, portable device. You can wear one while you do your normal daily activities. This is usually used to diagnose what is causing palpitations/syncope (passing out).  A Zio Patch Event Heart monitor will be applied to your chest today.  You will wear the patch for 14 days. After 24 hours, you may shower with the heart monitor on. If you feel any SYMPTOMS, you may press and release the button in the middle of the monitor.    Follow-Up: At [facility], you and your health needs are our priority.  As part of our continuing mission to provide you with exceptional heart care, we have created designated Provider Care Teams.  These Care Teams include your primary Cardiologist (physician) and Advanced Practice Providers (APPs -  Physician Assistants and Nurse Practitioners) who all work together to provide you with the care you need, when you need it. You will need a follow up appointment in 1 months.  Please call our office 2 months in advance to schedule this appointment.  You may  see one of the following Advanced Practice Providers on your designated Care Team:   Murray Hodgkins, NP [name], PA-C . Marrianne Mood, PA-C

## 2017-10-28 NOTE — Progress Notes (Signed)
New Outpatient Visit Date: 10/28/2017  Referring Provider: Burnard Hawthorne, FNP [address] [place], [place] [address]  Chief Complaint: Palpitations  HPI:  Christie Stevenson is a 77 y.o. female who is being seen today for the evaluation of palpitations at the request of Christie Stevenson. She has a history of hypertension, hyperlipidemia, carotid artery stenosis followed by vascular surgery, GERD, migraines, and chronic kidney disease stage 3.  She noted palpitations during a trip to [place] this summer.  She was subsequent seen at [facility] ED, where workup was unrevealing other than occasional PVC's noted on EKG.  Christie Stevenson also noted fatigue and was diagnoses with severe sleep apnea.  Today, Christie Stevenson is feeling well.  She has noted an irregular heart beat for years, though it seems to have been more pronounced over the last couple of months.  She occasionally feels her pulse and notes a prolonged pause.  She has associated lightheadedness and "floaty" feeling.  She has not passed out.  She denies chest pain, shortness of breath, orthopnea, PND, and edema.  She has passed out during pregnancy or when she hurts herself in the past.  She has noted less energy and mild exertional dyspnea over the last few years.  She is unsure if this is due to aging or deconditioning.  Christie Stevenson reports being hospitalized for chest pain about 15-20 years ago.  Extensive cardiac workup, including stress testing, was reportedly negative.  She has cut down on her caffeine consumption and now drinks 0.5-1.5 cups of coffee per day.  --------------------------------------------------------------------------------------------------  Cardiovascular History & Procedures: Cardiovascular Problems:  Palpitations  Carotid artery stenosis  Risk Factors:  Cerebrovascular disease, hypertension, hyperlipidemia, and age > 22  Cath/PCI:  None  CV Surgery:  None  EP Procedures and  Devices:  None  Non-Invasive Evaluation(s):  Carotid Doppler (02/02/16): 40-59% right and 1-39% left ICA stenoses.  >50% stenosis in right external carotid artery.  No significant change compared with 01/14/15.  Recent CV Pertinent Labs: Lab Results  Component Value Date   CHOL 137 04/08/2017   HDL 47.10 04/08/2017   LDLCALC 61 04/08/2017   TRIG 146.0 04/08/2017   CHOLHDL 3 04/08/2017   K 3.5 09/26/2017   BUN 22 09/26/2017   CREATININE 1.16 09/26/2017    --------------------------------------------------------------------------------------------------  Past Medical History:  Diagnosis Date  . 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ease   . Chronic kidney disease    STAGE 3  . Depression   . GERD (gastroesophageal reflux disease)   . History of colon polyps   . History of diverticulitis   . History of kidney stones   . Hyperlipidemia   . Hypertension   . Migraine   . Osteoporosis     Past Surgical History:  Procedure Laterality Date  . CATARACT EXTRACTION Left 2014  . CATARACT EXTRACTION W/PHACO Right 04/01/2015   Procedure: CATARACT EXTRACTION PHACO AND INTRAOCULAR LENS PLACEMENT (IOC);  Surgeon: [name], MD;  Location: [facility] ORS;  Service: Ophthalmology;  Laterality: Right;  [place] 00:47 AP% 20.8 CDE 9.76 fluid pack lot # [id number]  . COLONOSCOPY    . COLONOSCOPY WITH PROPOFOL N/A 05/26/2015   Procedure: COLONOSCOPY WITH PROPOFOL;  Surgeon: [name], MD;  Location: [facility] ENDOSCOPY;  Service: Endoscopy;  Laterality: N/A;  . TUBAL LIGATION      Current Meds  Medication Sig  . alendronate (FOSAMAX) 70 MG tablet Take 1 tablet (70 mg total) by mouth every 7 (seven) days. Take with  a full glass of water on an empty stomach.  Marland Kitchen atorvastatin (LIPITOR) 40 MG tablet Take 40 mg by mouth daily.  . Calcium Carb-Cholecalciferol (CALCIUM 600 + D PO) Take 600 mg by mouth 2 (two) times daily.  Marland Kitchen latanoprost (XALATAN) 0.005 % ophthalmic solution Place 1 drop into  the right eye at bedtime.  . metoprolol succinate (TOPROL-XL) 25 MG 24 hr tablet TAKE 1 TABLET BY MOUTH EVERY DAY  . omeprazole (PRILOSEC) 20 MG capsule Take 20 mg by mouth daily.  Marland Kitchen triamterene-hydrochlorothiazide (MAXZIDE-25) 37.5-25 MG tablet Take 1 tablet by mouth daily.    Allergies: Patient has no known allergies.  Social History   Tobacco Use  . Smoking status: Former Smoker    Packs/day: 1.00    Years: 40.00    Pack years: 40.00    Types: Cigarettes    Last attempt to quit: 07/22/1994    Years since quitting: 23.2  . Smokeless tobacco: Never Used  Substance Use Topics  . Alcohol use: No    Frequency: Never    Comment: 1 glass of wine on holidays.  . Drug use: No    Family History  Problem Relation Age of Onset  . Arthritis Mother   . Hypertension Mother   . Osteoporosis Mother   . Osteoporosis Sister   . Osteoporosis Brother   . Heart disease Brother 30       CABG and stents  . Diabetes Daughter   . Diabetes Son   . Cancer Paternal Uncle        Prostate Cancer  . Cancer Sister        Breast Cancer   . Osteoporosis Sister   . Breast cancer Sister        mid 21's  . Osteoporosis Brother   . Heart attack Brother 67  . Arrhythmia Brother        s/p pacermaker  . Diabetes Daughter     Review of Systems: A 12-system review of systems was performed and was negative except as noted in the HPI.  --------------------------------------------------------------------------------------------------  Physical Exam: BP 116/66 (BP Location: Left Arm, Patient Position: Sitting, Cuff Size: Normal)   Pulse (!) 58   Ht 5\' 2"  (1.575 m)   Wt 162 lb 6.4 oz (73.7 kg)   SpO2 98%   BMI 29.70 kg/m   General:  NAD HEENT: No conjunctival pallor or scleral icterus. Moist mucous membranes. OP clear. Neck: Supple without lymphadenopathy, thyromegaly, JVD, or HJR. No carotid bruit. Lungs: Normal work of breathing. Clear to auscultation bilaterally without wheezes or  crackles. Heart: Regular rate and rhythm without murmurs, rubs, or gallops. Non-displaced PMI. Abd: Bowel sounds present. Soft, NT/ND without hepatosplenomegaly Ext: No lower extremity edema. Radial, PT, and DP pulses are 2+ bilaterally Skin: Warm and dry without rash. Neuro: CNIII-XII intact. Strength and fine-touch sensation intact in upper and lower extremities bilaterally. Psych: Normal mood and affect.  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  Lab Results  Component Value Date   WBC 7.7 09/26/2017   HGB 12.7 09/26/2017   HCT 39.0 09/26/2017   MCV 83.1 09/26/2017   PLT 297.0 09/26/2017    Lab Results  Component Value Date   NA 139 09/26/2017   K 3.5 09/26/2017   CL 102 09/26/2017   CO2 30 09/26/2017   BUN 22 09/26/2017   CREATININE 1.16 09/26/2017   GLUCOSE 92 09/26/2017   ALT 13 09/26/2017  Lab Results  Component Value Date   CHOL 137 04/08/2017   HDL 47.10 04/08/2017   LDLCALC 61 04/08/2017   TRIG 146.0 04/08/2017   CHOLHDL 3 04/08/2017     --------------------------------------------------------------------------------------------------  ASSESSMENT AND PLAN: Palpitations and PVC's No symptoms reported today, though Christie Stevenson notes intermittent pauses and accompanying lightheadedness.  During recent ED visit, occasional PVC's were noted.  Her symptoms are consistent with this.  However, other arrhythmia is also possible.  We have agreed to obtain a 14-day event monitor for further evaluation.  Recent labs were unrevealing.  I will defer making any medication changes at this time.  If frequent PVC's or other arrhythmia are identified on monitor echo and ischemia testing will need to be considered.  Carotid artery stenosis No symptoms.  Continue statin therapy.  Addition of low-dose aspirin should be considered; I will defer this to Christie Stevenson and vascular surgery.  Hypertension Blood pressure  well-controlled.  Continue current medications.  Hyperlipidemia LDL well-controlled on last check in March (LDL 61; goal < 70).  Continue atorvastatin 40 mg daily.  Follow-up: Return to clinic in 1 month with APP.  Nelva Bush, MD 10/28/2017 4:15 PM

## 2017-11-10 DIAGNOSIS — I493 Ventricular premature depolarization: Secondary | ICD-10-CM

## 2017-11-10 DIAGNOSIS — R002 Palpitations: Secondary | ICD-10-CM

## 2017-11-29 ENCOUNTER — Telehealth: Payer: Self-pay | Admitting: *Deleted

## 2017-11-29 DIAGNOSIS — I491 Atrial premature depolarization: Secondary | ICD-10-CM

## 2017-11-29 DIAGNOSIS — I493 Ventricular premature depolarization: Secondary | ICD-10-CM

## 2017-11-29 NOTE — Telephone Encounter (Signed)
-----   Message from Nelva Bush, MD sent at 11/23/2017  7:06 AM EST ----- Please let Christie Stevenson know that her monitor showed occasional extra beats (PAC's and PVC's) and brief episodes of a fast heart rate (NSVT and PSVT).  Given these findings, I recommend that we obtain a transthoracic echocardiogram to exclude structural abnormalities and have her follow-up later this month, as planned.  She should continue to minimize her caffeine intake, if possible.

## 2017-11-29 NOTE — Telephone Encounter (Signed)
Results called to pt. Pt verbalized understanding of results and recommendations. She is agreeable to the echo. Order entered and transferred to scheduler.

## 2017-12-01 ENCOUNTER — Ambulatory Visit: Payer: Medicare Other | Admitting: Nurse Practitioner

## 2017-12-09 ENCOUNTER — Other Ambulatory Visit: Payer: Medicare Other

## 2017-12-10 NOTE — Progress Notes (Signed)
Cardiology Office Note Date:  12/12/2017  Patient ID:  Christie Stevenson, Christie Stevenson 31-Mar-1940, MRN [id number] PCP:  Burnard Hawthorne, FNP  Cardiologist:  Dr. Saunders Revel, MD    Chief Complaint: Follow up  History of Present Illness: Christie Stevenson is a 77 y.o. female with history of CKD stage III, HTN, HLD, carotid artery stenosis followed by vascular surgery, severe sleep apnea, GERD, and migraine disorder who presents for follow up of palpitations.  Patient was evaluated by Dr. Saunders Revel as a new patient on 10/28/2017 for palpitations. Patient reported noting palpitations during a trip to [place] during the summer of 2019. She was subsequently seen in the [facility] ED, where her work up was unrevealing other than some occasional PVCs noted on EKG. At her visit with Dr. Saunders Revel, she noted an irregular heart beat for years, though noted this seemed to have been more pronounced for the prior couple of months. She had associated lightheadedness. She reported a history of passing out during pregnancy and with pain. She noted increased fatigue and mild exertional dyspnea. She was drinking 0.5-1.5 cups of coffee daily. She reported a prior hospitalization ~ 15-20 years prior with extensive cardiac workup, including stress testing, was reportedly negative. Zio monitor in 10/2017 showed the predominant rhythm was sinus with an average heart rate of 67 bpm (range 43-120 bpm in sinus). There were rare PACs and brief supraventricular runs. Occasional PVCs (3% burden) were noted. There were also 3 episodes of NSVT lasting up to 4 beats. No sustained arrhythmia or pause was noted. Patient triggered events corresponded to sinus rhythm with PVCs. Echo was recommended and is scheduled for 11/25 with results pending at the time of her office visit.   Labs: 09/2017 - K+ 3.5, SCr 1.16, LFT normal, CBC unremarkable, A1c 6.0 03/2017 - TSH normal at 2.35, LDL 61  She comes in doing well today. She has noticed a significant decrease  in her palpitations. She continues to take Toprol XL 25 mg daily and has done so for many years. She notes a family history of palpitations in her sisters and daughters. She has cut back on caffeine use as well. No lower extremity swelling, abdominal distension, orthopnea, or early satiety. No falls since she was last seen. No dizziness, presycnope, or syncope. Of note, she has been taking OTC potassium supplement at 99 mg daily, which will be added to her medication list. She does not have any complaints today.   Past Medical History:  Diagnosis Date  . 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ease   . Chronic kidney disease    STAGE 3  . Depression   . GERD (gastroesophageal reflux disease)   . History of colon polyps   . History of diverticulitis   . History of kidney stones   . Hyperlipidemia   . Hypertension   . Migraine   . Osteoporosis     Past Surgical History:  Procedure Laterality Date  . CATARACT EXTRACTION Left 2014  . CATARACT EXTRACTION W/PHACO Right 04/01/2015   Procedure: CATARACT EXTRACTION PHACO AND INTRAOCULAR LENS PLACEMENT (IOC);  Surgeon: [name], MD;  Location: [facility] ORS;  Service: Ophthalmology;  Laterality: Right;  [place] 00:47 AP% 20.8 CDE 9.76 fluid pack lot # [id number]  . COLONOSCOPY    . COLONOSCOPY WITH PROPOFOL N/A 05/26/2015   Procedure: COLONOSCOPY WITH PROPOFOL;  Surgeon: [name], MD;  Location: [facility] ENDOSCOPY;  Service: Endoscopy;  Laterality: N/A;  . TUBAL LIGATION  Current Meds  Medication Sig  . alendronate (FOSAMAX) 70 MG tablet Take 1 tablet (70 mg total) by mouth every 7 (seven) days. Take with a full glass of water on an empty stomach.  Marland Kitchen atorvastatin (LIPITOR) 40 MG tablet Take 40 mg by mouth daily.  . Calcium Carb-Cholecalciferol (CALCIUM 600 + D PO) Take 600 mg by mouth 2 (two) times daily.  Marland Kitchen latanoprost (XALATAN) 0.005 % ophthalmic solution Place 1 drop into the right eye at bedtime.  . metoprolol succinate  (TOPROL-XL) 25 MG 24 hr tablet TAKE 1 TABLET BY MOUTH EVERY DAY  . omeprazole (PRILOSEC) 20 MG capsule Take 20 mg by mouth daily.  Marland Kitchen triamterene-hydrochlorothiazide (MAXZIDE-25) 37.5-25 MG tablet Take 1 tablet by mouth daily.    Allergies:   Patient has no known allergies.   Social History:  The patient  reports that she quit smoking about 23 years ago. Her smoking use included cigarettes. She has a 40.00 pack-year smoking history. She has never used smokeless tobacco. She reports that she does not drink alcohol or use drugs.   Family History:  The patient's family history includes Arrhythmia in her brother; Arthritis in her mother; Breast cancer in her sister; Cancer in her paternal uncle and sister; Diabetes in her daughter, daughter, and son; Heart attack (age of onset: 23) in her brother; Heart disease (age of onset: 29) in her brother; Hypertension in her mother; Osteoporosis in her brother, brother, mother, sister, and sister.  ROS:   Review of Systems  Constitutional: Negative for chills, diaphoresis, fever, malaise/fatigue and weight loss.  HENT: Negative for congestion.   Eyes: Negative for discharge and redness.  Respiratory: Negative for cough, hemoptysis, sputum production, shortness of breath and wheezing.   Cardiovascular: Positive for palpitations. Negative for chest pain, orthopnea, claudication, leg swelling and PND.       Improved palpitations.   Gastrointestinal: Negative for abdominal pain, blood in stool, heartburn, melena, nausea and vomiting.  Genitourinary: Negative for hematuria.  Musculoskeletal: Negative for falls and myalgias.  Skin: Negative for rash.  Neurological: Negative for dizziness, tingling, tremors, sensory change, speech change, focal weakness, loss of consciousness and weakness.  Endo/Heme/Allergies: Does not bruise/bleed easily.  Psychiatric/Behavioral: Negative for substance abuse. The patient is not nervous/anxious.   All other systems reviewed and  are negative.    PHYSICAL EXAM:  VS:  BP 110/62 (BP Location: Left Arm, Patient Position: Sitting, Cuff Size: Normal)   Pulse (!) 59   Ht 4\' 11"  (1.499 m)   Wt 160 lb 12 oz (72.9 kg)   BMI 32.47 kg/m  BMI: Body mass index is 32.47 kg/m.  Physical Exam  Constitutional: She is oriented to person, place, and time. She appears well-developed and well-nourished.  HENT:  Head: Normocephalic and atraumatic.  Eyes: Right eye exhibits no discharge. Left eye exhibits no discharge.  Neck: Normal range of motion. No JVD present.  Cardiovascular: Normal rate, regular rhythm, S1 normal, S2 normal and normal heart sounds. Exam reveals no distant heart sounds, no friction rub, no midsystolic click and no opening snap.  No murmur heard. Pulses:      Posterior tibial pulses are 2+ on the right side, and 2+ on the left side.  Pulmonary/Chest: Effort normal and breath sounds normal. No respiratory distress. She has no decreased breath sounds. She has no wheezes. She has no rales. She exhibits no tenderness.  Abdominal: Soft. She exhibits no distension. There is no tenderness.  Musculoskeletal: She exhibits no edema.  Neurological:  She is alert and oriented to person, place, and time.  Skin: Skin is warm and dry. No cyanosis. Nails show no clubbing.  Psychiatric: She has a normal mood and affect. Her speech is normal and behavior is normal. Judgment and thought content normal.     EKG:  Was not ordered today.   Recent Labs: 04/08/2017: TSH 2.35 09/26/2017: ALT 13; BUN 22; Creatinine, Ser 1.16; Hemoglobin 12.7; Platelets 297.0; Potassium 3.5; Sodium 139  04/08/2017: Cholesterol 137; HDL 47.10; LDL Cholesterol 61; Total CHOL/HDL Ratio 3; Triglycerides 146.0; VLDL 29.2   CrCl cannot be calculated (Patient's most recent lab result is older than the maximum 21 days allowed.).   Wt Readings from Last 3 Encounters:  12/12/17 160 lb 12 oz (72.9 kg)  10/28/17 162 lb 6.4 oz (73.7 kg)  09/26/17 161 lb 8 oz  (73.3 kg)     Other studies reviewed: Additional studies/records reviewed today include: summarized above  ASSESSMENT AND PLAN:  1. Palpitations/PVCs/NSVT/SVT: Much improved. Zio as above. Await echo results to evaluate for structural heart disease. Pending echo results, consider noninvasive ischemic evaluation. Continue Toprol XL 25 mg daily. Heart rate and BP preclude escalation at this time. Recent TSH normal. Check bmet and magnesium with recommendation to replete potassium to goal of 4.0 and magnesium to a goal of 2.0.  2. Carotid artery stenosis: Asymptomatic. Followed by vascular surgery. Consider addition of ASA, defer to vascular surgery.   3. HTN: Blood pressure is well controlled. Continue Toprol Xl and Maxzide.  4. HLD: LDL 61 in 03/2017. Remains on Lipitor 40 mg daily.   Disposition: F/u with Dr. Saunders Revel or an APP in 3 months, sooner if needed based on echo results.   Current medicines are reviewed at length with the patient today.  The patient did not have any concerns regarding medicines.  Signed, [name], PA-C 12/12/2017 2:34 PM     [address] [place], [place] [address] [phone]

## 2017-12-12 ENCOUNTER — Ambulatory Visit (INDEPENDENT_AMBULATORY_CARE_PROVIDER_SITE_OTHER): Payer: Medicare Other | Admitting: Physician Assistant

## 2017-12-12 ENCOUNTER — Other Ambulatory Visit: Payer: Self-pay

## 2017-12-12 ENCOUNTER — Encounter: Payer: Self-pay | Admitting: Physician Assistant

## 2017-12-12 ENCOUNTER — Ambulatory Visit (INDEPENDENT_AMBULATORY_CARE_PROVIDER_SITE_OTHER): Payer: Medicare Other

## 2017-12-12 VITALS — BP 110/62 | HR 59 | Ht 59.0 in | Wt 160.8 lb

## 2017-12-12 DIAGNOSIS — I493 Ventricular premature depolarization: Secondary | ICD-10-CM

## 2017-12-12 DIAGNOSIS — I491 Atrial premature depolarization: Secondary | ICD-10-CM

## 2017-12-12 DIAGNOSIS — I6523 Occlusion and stenosis of bilateral carotid arteries: Secondary | ICD-10-CM

## 2017-12-12 DIAGNOSIS — R002 Palpitations: Secondary | ICD-10-CM

## 2017-12-12 DIAGNOSIS — I472 Ventricular tachycardia: Secondary | ICD-10-CM | POA: Diagnosis not present

## 2017-12-12 DIAGNOSIS — I471 Supraventricular tachycardia: Secondary | ICD-10-CM

## 2017-12-12 DIAGNOSIS — I4729 Other ventricular tachycardia: Secondary | ICD-10-CM

## 2017-12-12 DIAGNOSIS — I10 Essential (primary) hypertension: Secondary | ICD-10-CM

## 2017-12-12 DIAGNOSIS — E785 Hyperlipidemia, unspecified: Secondary | ICD-10-CM

## 2017-12-12 NOTE — Patient Instructions (Signed)
Medication Instructions:  No changes  If you need a refill on your cardiac medications before your next appointment, please call your pharmacy.   Lab work: Your provider would like for you to have the following labs today: BMET and Magnesium  If you have labs (blood work) drawn today and your tests are completely normal, you will receive your results only by: Marland Kitchen MyChart Message (if you have MyChart) OR . A paper copy in the mail If you have any lab test that is abnormal or we need to change your treatment, we will call you to review the results.  Testing/Procedures: None ordered  Follow-Up: At [facility], you and your health needs are our priority.  As part of our continuing mission to provide you with exceptional heart care, we have created designated Provider Care Teams.  These Care Teams include your primary Cardiologist (physician) and Advanced Practice Providers (APPs -  Physician Assistants and Nurse Practitioners) who all work together to provide you with the care you need, when you need it. You will need a follow up appointment in 3 months. You may see Dr. Saunders Revel or one of the following Advanced Practice Providers on your designated Care Team:   Murray Hodgkins, NP [name], PA-C . Marrianne Mood, PA-C

## 2017-12-13 LAB — BASIC METABOLIC PANEL
BUN/Creatinine Ratio: 19 (ref 12–28)
BUN: 21 mg/dL (ref 8–27)
CALCIUM: 9.5 mg/dL (ref 8.7–10.3)
CO2: 24 mmol/L (ref 20–29)
Chloride: 100 mmol/L (ref 96–106)
Creatinine, Ser: 1.11 mg/dL — ABNORMAL HIGH (ref 0.57–1.00)
GFR calc non Af Amer: 48 mL/min/{1.73_m2} — ABNORMAL LOW (ref >59)
GFR, EST AFRICAN AMERICAN: 56 mL/min/{1.73_m2} — AB (ref >59)
GLUCOSE: 85 mg/dL (ref 65–99)
POTASSIUM: 3.9 mmol/L (ref 3.5–5.2)
Sodium: 140 mmol/L (ref 134–144)

## 2017-12-13 LAB — MAGNESIUM: MAGNESIUM: 2.1 mg/dL (ref 1.6–2.3)

## 2017-12-14 ENCOUNTER — Telehealth: Payer: Self-pay | Admitting: *Deleted

## 2017-12-14 DIAGNOSIS — R002 Palpitations: Secondary | ICD-10-CM

## 2017-12-14 NOTE — Telephone Encounter (Signed)
-----   Message from Nelva Bush, MD sent at 12/13/2017  7:51 PM EST ----- Please let Christie Stevenson know that her echo shows that her heart is contracting normally.  Mild leakage of the mitral valve was noted, though I do not think this is contributing to her symptoms.  Given PVC's and NSVT on recent event monitor, I recommend that we obtain a pharmacologic myocardial perfusion stress test to exclude underlying ischemia.  She should continue her current medications.

## 2017-12-14 NOTE — Telephone Encounter (Signed)
Results called to pt. Pt verbalized understanding of results and recommendations and is agreeable. She verbalized understanding and wrote down all the instructions as listed below.  [place]  Your caregiver has ordered a Stress Test with nuclear imaging. The purpose of this test is to evaluate the blood supply to your heart muscle. This procedure is referred to as a "Non-Invasive Stress Test." This is because other than having an IV started in your vein, nothing is inserted or "invades" your body. Cardiac stress tests are done to find areas of poor blood flow to the heart by determining the extent of coronary artery disease (CAD). Some patients exercise on a treadmill, which naturally increases the blood flow to your heart, while others who are  unable to walk on a treadmill due to physical limitations have a pharmacologic/chemical stress agent called Lexiscan . This medicine will mimic walking on a treadmill by temporarily increasing your coronary blood flow.   Please note: these test may take anywhere between 2-4 hours to complete  PLEASE REPORT TO [facility] AT THE FIRST DESK WILL DIRECT YOU WHERE TO GO  Date of Procedure:_______12/3/19___________  Arrival Time for Procedure:___08:45 am______   PLEASE NOTIFY THE OFFICE AT LEAST 24 HOURS IN ADVANCE IF YOU ARE UNABLE TO KEEP YOUR APPOINTMENT.  [phone] AND  PLEASE NOTIFY NUCLEAR MEDICINE AT [facility] AT LEAST 24 HOURS IN ADVANCE IF YOU ARE UNABLE TO KEEP YOUR APPOINTMENT. [phone]  How to prepare for your Myoview test:  1. Do not eat or drink after midnight 2. No caffeine for 24 hours prior to test 3. No smoking 24 hours prior to test. 4. Your medication may be taken with water.  If your doctor stopped a medication because of this test, do not take that medication. 5. Ladies, please do not wear dresses.  Skirts or pants are appropriate. Please wear a short sleeve shirt. 6. No perfume, cologne or  lotion. 7. Wear comfortable walking shoes. No heels!

## 2017-12-20 ENCOUNTER — Ambulatory Visit: Payer: Medicare Other

## 2017-12-21 ENCOUNTER — Other Ambulatory Visit: Payer: Self-pay | Admitting: Family

## 2017-12-29 ENCOUNTER — Ambulatory Visit
Admission: RE | Admit: 2017-12-29 | Discharge: 2017-12-29 | Disposition: A | Discharge status: Home / Self Care | Payer: Medicare Other | Source: Ambulatory Visit | Attending: Internal Medicine | Admitting: Internal Medicine

## 2017-12-29 DIAGNOSIS — I493 Ventricular premature depolarization: Secondary | ICD-10-CM | POA: Diagnosis not present

## 2017-12-29 DIAGNOSIS — R002 Palpitations: Secondary | ICD-10-CM | POA: Insufficient documentation

## 2017-12-29 LAB — NM MYOCAR MULTI W/SPECT W/WALL MOTION / EF
CHL CUP MPHR: 144 {beats}/min
CSEPEDS: 0 s
CSEPPHR: 96 {beats}/min
Estimated workload: 1 METS
Exercise duration (min): 0 min
LV dias vol: 58 mL (ref 46–106)
LVSYSVOL: 13 mL
Percent HR: 66 %
Rest HR: 49 {beats}/min
SDS: 1
SRS: 2
SSS: 0
TID: 0.97

## 2017-12-29 MED ORDER — TECHNETIUM TC 99M TETROFOSMIN IV KIT
11.0200 | PACK | Freq: Once | INTRAVENOUS | Status: AC | PRN
  Administered 2017-12-29: 11.02 via INTRAVENOUS

## 2017-12-29 MED ORDER — TECHNETIUM TC 99M TETROFOSMIN IV KIT
30.0000 | PACK | Freq: Once | INTRAVENOUS | Status: AC | PRN
  Administered 2017-12-29: 31.84 via INTRAVENOUS

## 2017-12-29 MED ORDER — REGADENOSON 0.4 MG/5ML IV SOLN
0.4000 mg | Freq: Once | INTRAVENOUS | Status: AC
  Administered 2017-12-29: 0.4 mg via INTRAVENOUS

## 2018-02-23 ENCOUNTER — Ambulatory Visit: Payer: Medicare Other

## 2018-03-01 ENCOUNTER — Ambulatory Visit: Payer: Medicare Other | Admitting: Internal Medicine

## 2018-03-26 ENCOUNTER — Other Ambulatory Visit: Payer: Self-pay | Admitting: Family

## 2018-04-18 ENCOUNTER — Telehealth: Payer: Self-pay

## 2018-04-18 NOTE — Telephone Encounter (Signed)
Declines virtual and telephone visit for AWV at this time.

## 2018-05-10 ENCOUNTER — Other Ambulatory Visit: Payer: Self-pay | Admitting: Family

## 2018-07-09 ENCOUNTER — Other Ambulatory Visit: Payer: Self-pay | Admitting: Family

## 2018-07-22 ENCOUNTER — Other Ambulatory Visit: Payer: Self-pay | Admitting: Family

## 2018-10-02 ENCOUNTER — Other Ambulatory Visit: Payer: Self-pay | Admitting: Family

## 2018-10-16 ENCOUNTER — Other Ambulatory Visit: Payer: Self-pay | Admitting: Family

## 2018-11-07 ENCOUNTER — Other Ambulatory Visit: Payer: Self-pay

## 2018-11-08 ENCOUNTER — Ambulatory Visit (INDEPENDENT_AMBULATORY_CARE_PROVIDER_SITE_OTHER): Payer: Medicare Other | Admitting: Family Medicine

## 2018-11-08 ENCOUNTER — Encounter: Payer: Self-pay | Admitting: Family Medicine

## 2018-11-08 ENCOUNTER — Other Ambulatory Visit: Payer: Self-pay

## 2018-11-08 VITALS — BP 122/74 | HR 63 | Temp 98.3°F | Wt 171.6 lb

## 2018-11-08 DIAGNOSIS — F4329 Adjustment disorder with other symptoms: Secondary | ICD-10-CM

## 2018-11-08 DIAGNOSIS — F329 Major depressive disorder, single episode, unspecified: Secondary | ICD-10-CM

## 2018-11-08 DIAGNOSIS — F32A Depression, unspecified: Secondary | ICD-10-CM

## 2018-11-08 MED ORDER — CITALOPRAM HYDROBROMIDE 10 MG PO TABS: 10.0000 mg | ORAL_TABLET | Freq: Every day | ORAL | 2 refills | Status: DC

## 2018-11-08 NOTE — Progress Notes (Signed)
Subjective:    Patient ID: Christie Stevenson, female    DOB: 1940/12/16, 78 y.o.   MRN: [id number]  HPI   Patient presents to clinic due to feeling down, depressed feeling stressed and anxious about a lot of things going on in her life and in the world currently.  Patient states she is extremely stressed and anxious about the upcoming election, states she thinks the world is in the state of chaos and hates it.  Also feels that she does not have good support from her family, states sometimes her grandsons especially will make her feel stupid for having an opinion understating certain things.  States she also has been weary of going over to family members homes due to them having to watch grandkids and being nervous about being around big crowds due to the coronavirus.  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  Denies SI or HI  States she is bored and sick of reading books and not being able to do a lot of things.  Patient Active Problem List   Diagnosis Date Noted  . Palpitations 10/28/2017  . PVC's (premature ventricular contractions) 10/28/2017  . Neuropathy 09/28/2017  . Irregular heart beat 08/19/2017  . Chronic kidney disease 05/11/2016  . Osteoporosis 03/22/2016  . Gastroesophageal reflux disease 03/22/2016  . History of palpitations 03/22/2016  . Routine general medical examination at a health care facility 03/23/2015  . Special screening for malignant neoplasms, colon 03/23/2015  . Cataracts, bilateral 03/23/2015  . Hyperlipidemia LDL goal <70 12/04/2013  . Carotid stenosis 11/02/2013  . Essential hypertension 11/02/2013  . Other fatigue 11/02/2013  . Screening for breast cancer 11/02/2013   Social History   Tobacco Use  . Smoking status: Former Smoker    Packs/day: 1.00    Years: 40.00    Pack years: 40.00    Types: Cigarettes    Quit date: 07/22/1994   Years since quitting: 24.3  . Smokeless tobacco: Never Used  Substance Use Topics  . Alcohol use: No    Frequency: Never    Comment: 1 glass of wine on holidays.    Review of Systems  Constitutional: Negative for chills, fatigue and fever.  HENT: Negative for congestion, ear pain, sinus pain and sore throat.   Eyes: Negative.   Respiratory: Negative for cough, shortness of breath and wheezing.   Cardiovascular: Negative for chest pain, palpitations and leg swelling.  Gastrointestinal: Negative for abdominal pain, diarrhea, nausea and vomiting.  Genitourinary: Negative for dysuria, frequency and urgency.  Musculoskeletal: Negative for arthralgias and myalgias.  Skin: Negative for color change, pallor and rash.  Neurological: Negative for syncope, light-headedness and headaches.  Psychiatric/Behavioral: The patient is stressed, down, anxious       Objective:   Physical Exam Vitals signs and nursing note reviewed.  Constitutional:      General: She is not in acute distress.    Appearance: She is not toxic-appearing.  HENT:     Head: Normocephalic and atraumatic.  Eyes:     Extraocular Movements: Extraocular movements intact.     Pupils: Pupils are equal, round, and reactive to light.  Neck:     Musculoskeletal: Normal range of motion and neck supple. No neck rigidity.  Skin:    General: Skin is warm and dry.     Coloration: Skin is not jaundiced  or pale.  Neurological:     General: No focal deficit present.     Mental Status: She is alert and oriented to person, place, and time.  Psychiatric:        Attention and Perception: Attention normal.        Mood and Affect: Mood is anxious.        Speech: Speech normal.        Behavior: Behavior is cooperative.        Thought Content: Thought content normal.        Cognition and Memory: Cognition normal.    Depression screen [facility] 2/9 11/09/2018 02/22/2017 02/20/2016 02/19/2015 01/03/2014  Decreased Interest 3 0 0 0 0  Down,  Depressed, Hopeless 3 0 0 0 -  PHQ - 2 Score 6 0 0 0 0  Altered sleeping 0 - - - -  Tired, decreased energy 0 - - - -  Change in appetite 0 - - - -  Feeling bad or failure about yourself  2 - - - -  Trouble concentrating 1 - - - -  Moving slowly or fidgety/restless 0 - - - -  Suicidal thoughts 0 - - - -  PHQ-9 Score 9 - - - -  Difficult doing work/chores Somewhat difficult - - - -    Today's Vitals   11/08/18 1131  BP: 122/74  Pulse: 63  Temp: 98.3 F (36.8 C)  SpO2: 96%  Weight: 171 lb 9.6 oz (77.8 kg)   Body mass index is 34.66 kg/m.    Assessment & Plan:    A total of 25 minutes were spent face-to-face with the patient during this encounter and over half of that time was spent on counseling and coordination of care. The patient was counseled on medications, ways to cope    Depressive disorder - Plan: citalopram (CELEXA) 10 MG tablet, Ambulatory referral to Psychology  Stress and adjustment reaction  Patient and I had long discussion about ways to cope with the changes in our out currently.  Strongly encourage patient to try to focus on things that she can control in her life rather than worrying about things she does not have control over like the people there, direction or the way the pandemic will go.  Advised patient that she can do things only in her power such as voting, wear a mask and do good handwashing to keep her self safe, avoid Big crowds, try to find activities that are both safe, fun & that are allowed during the pandemic with the current restrictions.  Patient will begin Celexa daily and she is agreeable to referral for counseling.  She will follow-up here in about 3 or 4 weeks for recheck and how she is doing with new medications.

## 2018-11-17 ENCOUNTER — Other Ambulatory Visit: Payer: Self-pay | Admitting: Family

## 2018-11-20 ENCOUNTER — Other Ambulatory Visit: Payer: Self-pay | Admitting: Family

## 2019-01-05 ENCOUNTER — Other Ambulatory Visit: Payer: Self-pay | Admitting: Family

## 2019-01-18 ENCOUNTER — Other Ambulatory Visit: Admission: RE | Admit: 2019-01-18 | Payer: Medicare Other | Source: Ambulatory Visit

## 2019-01-22 ENCOUNTER — Encounter: Admission: RE | Payer: Self-pay | Source: Home / Self Care

## 2019-01-22 ENCOUNTER — Ambulatory Visit: Admission: RE | Admit: 2019-01-22 | Payer: Medicare Other | Source: Home / Self Care | Admitting: Internal Medicine

## 2019-01-22 SURGERY — COLONOSCOPY WITH PROPOFOL
Anesthesia: General

## 2019-02-07 ENCOUNTER — Other Ambulatory Visit: Payer: Self-pay | Admitting: Family

## 2019-02-07 DIAGNOSIS — I10 Essential (primary) hypertension: Secondary | ICD-10-CM

## 2019-02-07 DIAGNOSIS — E785 Hyperlipidemia, unspecified: Secondary | ICD-10-CM

## 2019-02-08 ENCOUNTER — Other Ambulatory Visit: Payer: Self-pay

## 2019-02-08 ENCOUNTER — Telehealth: Payer: Self-pay | Admitting: Family

## 2019-02-08 DIAGNOSIS — F329 Major depressive disorder, single episode, unspecified: Secondary | ICD-10-CM

## 2019-02-08 DIAGNOSIS — F32A Depression, unspecified: Secondary | ICD-10-CM

## 2019-02-08 MED ORDER — CITALOPRAM HYDROBROMIDE 10 MG PO TABS: 10.0000 mg | ORAL_TABLET | Freq: Every day | ORAL | 2 refills | Status: DC

## 2019-02-08 NOTE — Telephone Encounter (Signed)
Okay to refill? Pt has upcoming appt in February. But based on medication list she is also on Potassium.

## 2019-02-08 NOTE — Telephone Encounter (Signed)
Refills sent to pharmacy. 

## 2019-02-08 NOTE — Telephone Encounter (Signed)
Pt needs a refill on citalopram (CELEXA) 10 MG tablet. Scheduled pt for med management appt on 03/06/2019.

## 2019-02-09 NOTE — Telephone Encounter (Signed)
Call pt She needs an education about the triamterene hydrochlorothiazide, as this medication actually causes potassium to elevate.  I do not see why she would need to be on a potassium supplement in addition to. Has She been taking this every day for years?  In either case,unless she has a strong history of low potassium that  I am unaware of, I would advise patient to stop taking any over-the-counter potassium and come in for labs in the next week.  We can look at her potassium while she is NOT on any sort of supplement for this and as long as it is normal, she does not need to routinely take potassium.    Educate her that if he potassium gets too high, patients can have lethal cardiac arrhythmias.  We have to be have to be very very careful with potassium.  Also ordered fasting labs in addition, she may have all these done prior to her appointment.

## 2019-03-06 ENCOUNTER — Encounter: Payer: Self-pay | Admitting: Family

## 2019-03-06 ENCOUNTER — Ambulatory Visit (INDEPENDENT_AMBULATORY_CARE_PROVIDER_SITE_OTHER): Payer: Medicare Other | Admitting: Family

## 2019-03-06 ENCOUNTER — Telehealth: Payer: Self-pay | Admitting: Family

## 2019-03-06 ENCOUNTER — Other Ambulatory Visit: Payer: Self-pay

## 2019-03-06 VITALS — BP 133/91 | HR 53 | Ht 60.0 in | Wt 152.0 lb

## 2019-03-06 DIAGNOSIS — F32A Depression, unspecified: Secondary | ICD-10-CM

## 2019-03-06 DIAGNOSIS — I10 Essential (primary) hypertension: Secondary | ICD-10-CM | POA: Diagnosis not present

## 2019-03-06 DIAGNOSIS — I6523 Occlusion and stenosis of bilateral carotid arteries: Secondary | ICD-10-CM | POA: Diagnosis not present

## 2019-03-06 DIAGNOSIS — F411 Generalized anxiety disorder: Secondary | ICD-10-CM | POA: Insufficient documentation

## 2019-03-06 DIAGNOSIS — Z1231 Encounter for screening mammogram for malignant neoplasm of breast: Secondary | ICD-10-CM

## 2019-03-06 DIAGNOSIS — M810 Age-related osteoporosis without current pathological fracture: Secondary | ICD-10-CM | POA: Diagnosis not present

## 2019-03-06 DIAGNOSIS — F329 Major depressive disorder, single episode, unspecified: Secondary | ICD-10-CM

## 2019-03-06 MED ORDER — CITALOPRAM HYDROBROMIDE 10 MG PO TABS: 10.0000 mg | ORAL_TABLET | Freq: Every day | ORAL | 2 refills | Status: DC

## 2019-03-06 NOTE — Assessment & Plan Note (Signed)
Very much improved on Celexa, will continue

## 2019-03-06 NOTE — Assessment & Plan Note (Addendum)
BP Readings from Last 3 Encounters:  03/06/19 (!) 133/91  11/08/18 122/74  12/12/17 110/62  Slightly elevated Advised to to keep blood pressure log to ensure that blood pressure is well controlled, historically it has been. Patient will come in for follow-up in a couple months as well and bring in blood pressure cuff

## 2019-03-06 NOTE — Assessment & Plan Note (Signed)
Appears asymptomatic .patient had been a patient of Dr. Larita Fife and vascular. She would like to see a different vascular physician. We will await ultrasound carotid and go from there.

## 2019-03-06 NOTE — Progress Notes (Signed)
Verbal consent for services obtained from patient prior to services given to TELEPHONE visit:   Location of call:  provider at work patient at home  Names of all persons present for services: Christie Paris, NP Chief complaint:  Follow up  Feels well. No complaints Feels well on celexa and has 'lifted the cloud '. Anxiety has improved.   HTN - 133/91, HR 53. Thinks prior reading was wrong. Denies exertional chest pain or pressure, numbness or tingling radiating to left arm or jaw, palpitations, dizziness, frequent headaches, changes in vision, or shortness of breath.   HLD- compliant with lipitor.   Osteoporosis - stopped fosamax due to back pain, resolved after taking medication.   Due BL US carotid, MM, DEXA  History, background, results pertinent:  Former smoker  A/P/next steps: Problem List Items Addressed This Visit      Cardiovascular and Mediastinum   Carotid stenosis    Appears asymptomatic .patient had been a patient of Dr. Ronalee Belts [place] and vascular. She would like to see a different vascular physician. We will await ultrasound carotid and go from there.      Essential hypertension - Primary    BP Readings from Last 3 Encounters:  03/06/19 (!) 133/91  11/08/18 122/74  12/12/17 110/62  Slightly elevated Advised to to keep blood pressure log to ensure that blood pressure is well controlled, historically it has been. Patient will come in for follow-up in a couple months as well and bring in blood pressure cuff       Relevant Orders   US Carotid Duplex Bilateral     Musculoskeletal and Integument   Osteoporosis    Not any agent for osteoporosis at this time. Repeat DEXA and will go from there      Relevant Orders   MM 3D SCREEN BREAST BILATERAL   DG Bone Density     Other   GAD (generalized anxiety disorder)    Very much improved on Celexa, will continue      Screening for breast cancer   Relevant Orders   MM 3D SCREEN BREAST BILATERAL   DG Bone  Density     Of note, patient understands to schedule DEXA and mammogram at [facility].   I spent 25 min  discussing plan of care over the phone.

## 2019-03-06 NOTE — Telephone Encounter (Signed)
Left message for pt to schedule fasting labs and 3 month follow up.

## 2019-03-06 NOTE — Patient Instructions (Addendum)
Please call  and schedule your 3D mammogram, bone density scan ( 7 / 2021) as discussed.   [place]  [place], [place]

## 2019-03-06 NOTE — Assessment & Plan Note (Signed)
Not any agent for osteoporosis at this time. Repeat DEXA and will go from there

## 2019-03-09 ENCOUNTER — Other Ambulatory Visit: Payer: Self-pay

## 2019-03-09 ENCOUNTER — Telehealth: Payer: Self-pay | Admitting: Family

## 2019-03-09 MED ORDER — METOPROLOL SUCCINATE ER 25 MG PO TB24: 25.0000 mg | ORAL_TABLET | Freq: Every day | ORAL | 1 refills | Status: DC

## 2019-03-09 NOTE — Telephone Encounter (Signed)
Refill sent to [facility].  

## 2019-03-09 NOTE — Telephone Encounter (Signed)
Pt needs a refill on metoprolol succinate (TOPROL-XL) 25 MG 24 hr tablet sent to [facility]  

## 2019-04-02 ENCOUNTER — Telehealth: Payer: Self-pay | Admitting: Family

## 2019-04-02 NOTE — Telephone Encounter (Signed)
I called pt and left a vm for pt to call ofc.

## 2019-04-04 ENCOUNTER — Other Ambulatory Visit: Payer: Self-pay

## 2019-04-04 ENCOUNTER — Other Ambulatory Visit (INDEPENDENT_AMBULATORY_CARE_PROVIDER_SITE_OTHER): Payer: Medicare Other

## 2019-04-04 DIAGNOSIS — I10 Essential (primary) hypertension: Secondary | ICD-10-CM | POA: Diagnosis not present

## 2019-04-04 DIAGNOSIS — E785 Hyperlipidemia, unspecified: Secondary | ICD-10-CM

## 2019-04-04 LAB — CBC WITH DIFFERENTIAL/PLATELET
Basophils Absolute: 0.1 10*3/uL (ref 0.0–0.1)
Basophils Relative: 1.2 % (ref 0.0–3.0)
Eosinophils Absolute: 0.2 10*3/uL (ref 0.0–0.7)
Eosinophils Relative: 2.5 % (ref 0.0–5.0)
HCT: 39.6 % (ref 36.0–46.0)
Hemoglobin: 13.1 g/dL (ref 12.0–15.0)
Lymphocytes Relative: 29.4 % (ref 12.0–46.0)
Lymphs Abs: 1.8 10*3/uL (ref 0.7–4.0)
MCHC: 33.1 g/dL (ref 30.0–36.0)
MCV: 88.7 fl (ref 78.0–100.0)
Monocytes Absolute: 0.5 10*3/uL (ref 0.1–1.0)
Monocytes Relative: 8.6 % (ref 3.0–12.0)
Neutro Abs: 3.6 10*3/uL (ref 1.4–7.7)
Neutrophils Relative %: 58.3 % (ref 43.0–77.0)
Platelets: 252 10*3/uL (ref 150.0–400.0)
RBC: 4.47 Mil/uL (ref 3.87–5.11)
RDW: 14.9 % (ref 11.5–15.5)
WBC: 6.2 10*3/uL (ref 4.0–10.5)

## 2019-04-04 LAB — COMPREHENSIVE METABOLIC PANEL
ALT: 17 U/L (ref 0–35)
AST: 22 U/L (ref 0–37)
Albumin: 3.9 g/dL (ref 3.5–5.2)
Alkaline Phosphatase: 88 U/L (ref 39–117)
BUN: 29 mg/dL — ABNORMAL HIGH (ref 6–23)
CO2: 28 mEq/L (ref 19–32)
Calcium: 9.8 mg/dL (ref 8.4–10.5)
Chloride: 102 mEq/L (ref 96–112)
Creatinine, Ser: 1.39 mg/dL — ABNORMAL HIGH (ref 0.40–1.20)
GFR: 36.7 mL/min — ABNORMAL LOW (ref >60.00)
Glucose, Bld: 99 mg/dL (ref 70–99)
Potassium: 3.6 mEq/L (ref 3.5–5.1)
Sodium: 139 mEq/L (ref 135–145)
Total Bilirubin: 0.7 mg/dL (ref 0.2–1.2)
Total Protein: 6.7 g/dL (ref 6.0–8.3)

## 2019-04-04 LAB — HEMOGLOBIN A1C: Hgb A1c MFr Bld: 5.6 % (ref 4.6–6.5)

## 2019-04-04 LAB — LIPID PANEL
Cholesterol: 158 mg/dL (ref 0–200)
HDL: 42.3 mg/dL (ref >39.00)
LDL Cholesterol: 94 mg/dL (ref 0–99)
NonHDL: 115.58
Total CHOL/HDL Ratio: 4
Triglycerides: 108 mg/dL (ref 0.0–149.0)
VLDL: 21.6 mg/dL (ref 0.0–40.0)

## 2019-04-05 ENCOUNTER — Other Ambulatory Visit: Payer: Medicare Other

## 2019-04-09 ENCOUNTER — Other Ambulatory Visit: Payer: Self-pay | Admitting: Family

## 2019-04-09 DIAGNOSIS — N189 Chronic kidney disease, unspecified: Secondary | ICD-10-CM

## 2019-04-25 ENCOUNTER — Telehealth: Payer: Self-pay | Admitting: Family

## 2019-04-25 NOTE — Telephone Encounter (Signed)
I called pt and left a vm to call ofc to sch [place].

## 2019-05-01 ENCOUNTER — Ambulatory Visit
Admission: RE | Admit: 2019-05-01 | Discharge: 2019-05-01 | Disposition: A | Discharge status: Home / Self Care | Payer: Medicare Other | Source: Ambulatory Visit | Attending: Family | Admitting: Family

## 2019-05-01 DIAGNOSIS — M810 Age-related osteoporosis without current pathological fracture: Secondary | ICD-10-CM | POA: Diagnosis not present

## 2019-05-01 DIAGNOSIS — Z1231 Encounter for screening mammogram for malignant neoplasm of breast: Secondary | ICD-10-CM | POA: Diagnosis not present

## 2019-05-02 ENCOUNTER — Other Ambulatory Visit: Payer: Self-pay | Admitting: Family

## 2019-05-02 DIAGNOSIS — R928 Other abnormal and inconclusive findings on diagnostic imaging of breast: Secondary | ICD-10-CM

## 2019-05-02 DIAGNOSIS — N6320 Unspecified lump in the left breast, unspecified quadrant: Secondary | ICD-10-CM

## 2019-05-03 ENCOUNTER — Encounter: Payer: Self-pay | Admitting: Family

## 2019-05-17 ENCOUNTER — Ambulatory Visit
Admission: RE | Admit: 2019-05-17 | Discharge: 2019-05-17 | Disposition: A | Discharge status: Home / Self Care | Payer: Medicare Other | Source: Ambulatory Visit | Attending: Family | Admitting: Family

## 2019-05-17 DIAGNOSIS — R928 Other abnormal and inconclusive findings on diagnostic imaging of breast: Secondary | ICD-10-CM

## 2019-05-17 DIAGNOSIS — R922 Inconclusive mammogram: Secondary | ICD-10-CM | POA: Diagnosis not present

## 2019-05-17 DIAGNOSIS — N6320 Unspecified lump in the left breast, unspecified quadrant: Secondary | ICD-10-CM | POA: Diagnosis not present

## 2019-05-17 DIAGNOSIS — N6012 Diffuse cystic mastopathy of left breast: Secondary | ICD-10-CM | POA: Diagnosis not present

## 2019-05-18 ENCOUNTER — Other Ambulatory Visit: Payer: Self-pay | Admitting: Family

## 2019-05-18 DIAGNOSIS — R928 Other abnormal and inconclusive findings on diagnostic imaging of breast: Secondary | ICD-10-CM

## 2019-05-20 ENCOUNTER — Other Ambulatory Visit: Payer: Self-pay | Admitting: Internal Medicine

## 2019-05-23 ENCOUNTER — Other Ambulatory Visit: Payer: Self-pay | Admitting: Family

## 2019-05-23 DIAGNOSIS — R928 Other abnormal and inconclusive findings on diagnostic imaging of breast: Secondary | ICD-10-CM

## 2019-05-31 ENCOUNTER — Telehealth: Payer: Self-pay | Admitting: Family

## 2019-05-31 NOTE — Telephone Encounter (Signed)
Appears US carotid not scheduled- can we schedule?

## 2019-06-01 NOTE — Telephone Encounter (Signed)
Letter printed and mailed.  

## 2019-06-01 NOTE — Telephone Encounter (Signed)
Ok

## 2019-06-01 NOTE — Telephone Encounter (Signed)
I called pt twice on 03/15 and 04/07 and left vm for pt to call ofc to sch.

## 2019-06-01 NOTE — Telephone Encounter (Signed)
I called pt back today and left another msg to call ofc to sch [place].

## 2019-06-01 NOTE — Telephone Encounter (Signed)
Thanks rasheedhah. We will close then one out.   Sarah Please mail letter   Ms Lema, [place] you are well. Please call the office and make a follow up appointment with me if you would like to discuss  carotid ultrasound of the neck as we discussed and I advised on 03/06/19. The order has expired.   My Best Laykin Rainone

## 2019-06-11 DIAGNOSIS — H40053 Ocular hypertension, bilateral: Secondary | ICD-10-CM | POA: Diagnosis not present

## 2019-06-22 ENCOUNTER — Ambulatory Visit: Payer: Medicare Other | Admitting: Family

## 2019-06-25 ENCOUNTER — Encounter: Payer: Self-pay | Admitting: Family

## 2019-06-25 ENCOUNTER — Other Ambulatory Visit: Payer: Self-pay

## 2019-06-25 ENCOUNTER — Ambulatory Visit (INDEPENDENT_AMBULATORY_CARE_PROVIDER_SITE_OTHER): Payer: Medicare Other | Admitting: Family

## 2019-06-25 VITALS — BP 122/72 | HR 55 | Temp 96.4°F | Ht 60.71 in | Wt 141.2 lb

## 2019-06-25 DIAGNOSIS — I10 Essential (primary) hypertension: Secondary | ICD-10-CM | POA: Diagnosis not present

## 2019-06-25 DIAGNOSIS — F411 Generalized anxiety disorder: Secondary | ICD-10-CM

## 2019-06-25 DIAGNOSIS — N189 Chronic kidney disease, unspecified: Secondary | ICD-10-CM | POA: Diagnosis not present

## 2019-06-25 DIAGNOSIS — I6523 Occlusion and stenosis of bilateral carotid arteries: Secondary | ICD-10-CM | POA: Diagnosis not present

## 2019-06-25 DIAGNOSIS — G4733 Obstructive sleep apnea (adult) (pediatric): Secondary | ICD-10-CM | POA: Diagnosis not present

## 2019-06-25 NOTE — Progress Notes (Signed)
Subjective:    Patient ID: Christie Stevenson, female    DOB: Oct 20, 1940, 79 y.o.   MRN: [id number]  CC: Christie Stevenson is a 79 y.o. female who presents today for follow up.   HPI: Feels well today, no complaints   HTN- at home, 142/ 80 at home. Walking. Working in yard.  She does not feel the need to get back to cardiology at this time  Denies exertional chest pain or pressure, numbness or tingling radiating to left arm or jaw, palpitations, dizziness, frequent headaches, changes in vision, or shortness of breath.   HLD- compliant with lipitor  OSA -since weight loss has stopped wearing cipap. No longer snoring. No ha, sob, fatigue.   GAD- doing well on celexa.   CKD- no NSAIDs.   Due for left diagnostic mammogram and left breast ultrasound 10/2019; ordered DEXA scheduled 07/30/19 Carotid stenosis-Dr. Delana Meyer ' last [place] 01/2016 Christie Stevenson- palpitations 11/2017 HISTORY:  Past Medical History:  Diagnosis Date  . 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ease   . Chronic kidney disease    STAGE 3  . Depression   . GERD (gastroesophageal reflux disease)   . History of colon polyps   . History of diverticulitis   . History of kidney stones   . Hyperlipidemia   . Hypertension   . Migraine   . Osteoporosis    Past Surgical History:  Procedure Laterality Date  . CATARACT EXTRACTION Left 2014  . CATARACT EXTRACTION W/PHACO Right 04/01/2015   Procedure: CATARACT EXTRACTION PHACO AND INTRAOCULAR LENS PLACEMENT (IOC);  Surgeon: [name], MD;  Location: [facility] ORS;  Service: Ophthalmology;  Laterality: Right;  [place] 00:47 AP% 20.8 CDE 9.76 fluid pack lot # [id number]  . COLONOSCOPY    . COLONOSCOPY WITH PROPOFOL N/A 05/26/2015   Procedure: COLONOSCOPY WITH PROPOFOL;  Surgeon: [name], MD;  [facility];  Service: Endoscopy;  Laterality: N/A;  . TUBAL LIGATION     Family History  Problem Relation Age of Onset  . Arthritis Mother   . Hypertension Mother     . Osteoporosis Mother   . Osteoporosis Sister   . Osteoporosis Brother   . Heart disease Brother 49       CABG and stents  . Diabetes Daughter   . Diabetes Son   . Cancer Paternal Uncle        Prostate Cancer  . Cancer Sister        Breast Cancer   . Osteoporosis Sister   . Breast cancer Sister        mid 28's  . Osteoporosis Brother   . Heart attack Brother 68  . Arrhythmia Brother        s/p pacermaker  . Diabetes Daughter     Allergies: Patient has no known allergies. Current Outpatient Medications on File Prior to Visit  Medication Sig Dispense Refill  . atorvastatin (LIPITOR) 40 MG tablet TAKE 1 TABLET BY MOUTH EVERY DAY 90 tablet 1  . Calcium Carb-Cholecalciferol (CALCIUM 600 + D PO) Take 600 mg by mouth 2 (two) times daily.    . Calcium Carbonate-Vitamin D 600-200 MG-UNIT TABS Take by mouth.    . citalopram (CELEXA) 10 MG tablet Take 1 tablet (10 mg total) by mouth daily. 90 tablet 2  . latanoprost (XALATAN) 0.005 % ophthalmic solution Place 1 drop into the right eye at bedtime.    . metoprolol succinate (TOPROL-XL) 25 MG 24 hr tablet Take 1 tablet (25 mg  total) by mouth daily. 90 tablet 1  . Multiple Vitamins-Minerals (MULTIPLE VITAMINS/WOMENS) tablet Take 1 tablet by mouth daily.    Marland Kitchen omeprazole (PRILOSEC) 20 MG capsule Take 20 mg by mouth daily.    . ondansetron (ZOFRAN-ODT) 4 MG disintegrating tablet Take by mouth.    . triamterene-hydrochlorothiazide (MAXZIDE-25) 37.5-25 MG tablet TAKE 1 TABLET BY MOUTH EVERY DAY 90 tablet 1   No current facility-administered medications on file prior to visit.    Social History   Tobacco Use  . Smoking status: Former Smoker    Packs/day: 1.00    Years: 40.00    Pack years: 40.00    Types: Cigarettes    Quit date: 07/22/1994    Years since quitting: 24.9  . Smokeless tobacco: Never Used  Substance Use Topics  . Alcohol use: No    Comment: 1 glass of wine on holidays.  . Drug use: No    Review of Systems   Constitutional: Negative for chills and fever.  Respiratory: Negative for cough.   Cardiovascular: Negative for chest pain and palpitations.  Gastrointestinal: Negative for nausea and vomiting.      Objective:    BP 122/72   Pulse (!) 55   Temp (!) 96.4 F (35.8 C)   Ht 5' 0.71" (1.542 m)   Wt 141 lb 3.2 oz (64 kg)   SpO2 97%   BMI 26.94 kg/m  BP Readings from Last 3 Encounters:  06/25/19 122/72  03/06/19 (!) 133/91  11/08/18 122/74   Wt Readings from Last 3 Encounters:  06/25/19 141 lb 3.2 oz (64 kg)  03/06/19 152 lb (68.9 kg)  11/08/18 171 lb 9.6 oz (77.8 kg)    Physical Exam Vitals reviewed.  Constitutional:      Appearance: She is well-developed.  Eyes:     Conjunctiva/sclera: Conjunctivae normal.  Cardiovascular:     Rate and Rhythm: Normal rate and regular rhythm.     Pulses: Normal pulses.     Heart sounds: Normal heart sounds.  Pulmonary:     Effort: Pulmonary effort is normal.     Breath sounds: Normal breath sounds. No wheezing, rhonchi or rales.  Skin:    General: Skin is warm and dry.  Neurological:     Mental Status: She is alert.  Psychiatric:        Speech: Speech normal.        Behavior: Behavior normal.        Thought Content: Thought content normal.        Assessment & Plan:   Problem List Items Addressed This Visit      Cardiovascular and Mediastinum   Carotid stenosis    Appears asymptomatic . overdue for surveillance of carotid stenosis, patient would like to see a different vascular doctor.  referral has been placed.      Essential hypertension - Primary    Stable, continue current regimen      Relevant Orders   Basic metabolic panel   Ambulatory referral to Vascular Surgery     Respiratory   OSA (obstructive sleep apnea)    Not currently wearing cipap.  Long discussion regards the risk of not treating sleep apnea including stroke and heart attack.  Patient was very agreeable to consult with pulmonology to see if she  still requires.  Referral has been placed back to Dr Mortimer Fries      Relevant Orders   Ambulatory referral to Pulmonology     Genitourinary   Chronic kidney disease  Other   GAD (generalized anxiety disorder)    Doing well on Celexa, will continue          I am having Darylene Price. Bolender maintain her Calcium Carb-Cholecalciferol (CALCIUM 600 + D PO), latanoprost, omeprazole, triamterene-hydrochlorothiazide, Multiple Vitamins/Womens, citalopram, metoprolol succinate, atorvastatin, ondansetron, and Calcium Carbonate-Vitamin D.   No orders of the defined types were placed in this encounter.   Return precautions given.   Risks, benefits, and alternatives of the medications and treatment plan prescribed today were discussed, and patient expressed understanding.   Education regarding symptom management and diagnosis given to patient on AVS.  Continue to follow with Burnard Hawthorne, FNP for routine health maintenance.   Christie Stevenson and I agreed with plan.   [name], FNP

## 2019-06-25 NOTE — Assessment & Plan Note (Signed)
Stable, continue current regimen 

## 2019-06-25 NOTE — Assessment & Plan Note (Signed)
Appears asymptomatic . overdue for surveillance of carotid stenosis, patient would like to see a different vascular doctor.  referral has been placed.

## 2019-06-25 NOTE — Assessment & Plan Note (Addendum)
Not currently wearing cipap.  Long discussion regards the risk of not treating sleep apnea including stroke and heart attack.  Patient was very agreeable to consult with pulmonology to see if she still requires.  Referral has been placed back to Dr Mortimer Fries

## 2019-06-25 NOTE — Assessment & Plan Note (Signed)
Doing well on Celexa, will continue

## 2019-06-25 NOTE — Patient Instructions (Signed)
Lab work shows diminished kidney function which appears stable from you baseline. We will recheck this next week.   Most important to avoid all over the counter antiinflammatories ( such as Aleve, Motrin, ibuprofen etc) as they can worsen kidney function. Our goal is to preserve your kidney function going forward. If we see any more of a decrease, I will place a referral to nephrology.    Referral to vascular, pulmonology. Let us know if you dont hear back within a week in regards to an appointment being scheduled.

## 2019-07-02 ENCOUNTER — Other Ambulatory Visit (INDEPENDENT_AMBULATORY_CARE_PROVIDER_SITE_OTHER): Payer: Medicare Other

## 2019-07-02 ENCOUNTER — Other Ambulatory Visit: Payer: Self-pay

## 2019-07-02 DIAGNOSIS — I10 Essential (primary) hypertension: Secondary | ICD-10-CM | POA: Diagnosis not present

## 2019-07-02 LAB — BASIC METABOLIC PANEL
BUN: 32 mg/dL — ABNORMAL HIGH (ref 6–23)
CO2: 29 mEq/L (ref 19–32)
Calcium: 10.4 mg/dL (ref 8.4–10.5)
Chloride: 97 mEq/L (ref 96–112)
Creatinine, Ser: 1.51 mg/dL — ABNORMAL HIGH (ref 0.40–1.20)
GFR: 33.33 mL/min — ABNORMAL LOW (ref >60.00)
Glucose, Bld: 100 mg/dL — ABNORMAL HIGH (ref 70–99)
Potassium: 3.4 mEq/L — ABNORMAL LOW (ref 3.5–5.1)
Sodium: 135 mEq/L (ref 135–145)

## 2019-07-09 NOTE — Progress Notes (Signed)
@Patient  ID: Christie Stevenson, female    DOB: 1940/11/02, 79 y.o.   MRN: [id number]  Chief Complaint  Patient presents with  . Follow-up    pt stopped wearing bipap 62mo ago but has recently started back to wearing. feels pressure and mask are okay. wearing bipap avg 8hr nightly.     Referring provider: Burnard Hawthorne, FNP  HPI:  79 year old female former smoker followed in our office for obstructive sleep apnea  PMH: Hypertension, hyperlipidemia, osteoporosis, GERD, chronic kidney disease, anxiety Smoker/ Smoking History: Former smoker.  Quit 1996.  40-pack-year smoking history Maintenance: None Pt of: DK  07/10/2019  - Visit   79 year old female former smoker followed in our office for obstructive sleep apnea.  Patient is followed by DK.  She reports that she stopped wearing her BiPAP 2 months ago after a 40 pound weight loss.  She reports that she started back wearing and feels the pressure in the mask are okay and she is wearing the BiPAP on average around 8 hours nightly.  We are requesting a download from her DME company [employer].  She feels the mask is fitting appropriately.  Patient was last seen in our office in August/2019 patient completed home sleep study at that time in 2019 which revealed severe obstructive sleep apnea where an in lab sleep study was ordered.  In lab sleep study recommended BiPAP.  Patient was started on this back in September/2019.  Patient tried coming off BiPAP for the last 2 months.  She admits that her sleep is better than it was in 2019 off of BiPAP but it was not as good as when she was on BiPAP.  She resumed using BiPAP 2 days ago.  Her plan is to remain on BiPAP.  She was wanting to see whether or not the weight loss had helped.  We will discuss this today.  Questionaires / Pulmonary Flowsheets:   ACT:  No flowsheet data found.  MMRC: No flowsheet data found.  Epworth:  Results of the Epworth flowsheet 09/01/2017  Sitting and reading 0    Watching TV 0  Sitting, inactive in a public place (e.g. a theatre or a meeting) 0  As a passenger in a car for an hour without a break 1  Lying down to rest in the afternoon when circumstances permit 3  Sitting and talking to someone 0  Sitting quietly after a lunch without alcohol 0  In a car, while stopped for a few minutes in traffic 0  Total score 4    Tests:   09/27/2017-home sleep study-AHI 29.7, SaO2 low 87%  10/07/2017-in lab study recommending BiPAP  12/12/2017-echocardiogram-LV ejection fraction 55 to 73%, grade 1 diastolic dysfunction, systolic pressure was within normal range in the pulmonary arteries  FENO:  No results found for: NITRICOXIDE  PFT: No flowsheet data found.  WALK:  No flowsheet data found.  Imaging: No results found.  Lab Results:  CBC    Component Value Date/Time   WBC 6.2 04/04/2019 0857   RBC 4.47 04/04/2019 0857   HGB 13.1 04/04/2019 0857   HCT 39.6 04/04/2019 0857   PLT 252.0 04/04/2019 0857   MCV 88.7 04/04/2019 0857   MCHC 33.1 04/04/2019 0857   RDW 14.9 04/04/2019 0857   LYMPHSABS 1.8 04/04/2019 0857   MONOABS 0.5 04/04/2019 0857   EOSABS 0.2 04/04/2019 0857   BASOSABS 0.1 04/04/2019 0857    BMET    Component Value Date/Time   NA 135 07/02/2019 0848  NA 140 12/12/2017 1453   K 3.4 (L) 07/02/2019 0848   CL 97 07/02/2019 0848   CO2 29 07/02/2019 0848   GLUCOSE 100 (H) 07/02/2019 0848   BUN 32 (H) 07/02/2019 0848   BUN 21 12/12/2017 1453   CREATININE 1.51 (H) 07/02/2019 0848   CALCIUM 10.4 07/02/2019 0848   GFRNONAA 48 (L) 12/12/2017 1453   GFRAA 56 (L) 12/12/2017 1453    BNP No results found for: BNP  ProBNP No results found for: PROBNP  Specialty Problems      Pulmonary Problems   OSA (obstructive sleep apnea)    Diagnosed 2019          No Known Allergies  Immunization History  Administered Date(s) Administered  . Influenza Split 10/19/2013  . Influenza, High Dose Seasonal PF 10/15/2016,  09/26/2017  . Influenza-Unspecified 12/02/2012, 10/19/2015, 10/26/2016, 10/19/2018  . PFIZER SARS-COV-2 Vaccination 02/22/2019, 03/15/2019  . Pneumococcal Conjugate-13 12/02/2012  . Pneumococcal Polysaccharide-23 03/17/2015    Past Medical History:  Diagnosis Date  . 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ease   . Chronic kidney disease    STAGE 3  . Depression   . GERD (gastroesophageal reflux disease)   . History of colon polyps   . History of diverticulitis   . History of kidney stones   . Hyperlipidemia   . Hypertension   . Migraine   . Osteoporosis     Tobacco History: Social History   Tobacco Use  Smoking Status Former Smoker  . Packs/day: 1.00  . Years: 40.00  . Pack years: 40.00  . Types: Cigarettes  . Quit date: 07/22/1994  . Years since quitting: 24.9  Smokeless Tobacco Never Used   Counseling given: Yes   Continue to not smoke  Outpatient Encounter Medications as of 07/10/2019  Medication Sig  . atorvastatin (LIPITOR) 40 MG tablet TAKE 1 TABLET BY MOUTH EVERY DAY  . Calcium Carb-Cholecalciferol (CALCIUM 600 + D PO) Take 600 mg by mouth 2 (two) times daily.  . Calcium Carbonate-Vitamin D 600-200 MG-UNIT TABS Take by mouth.  . citalopram (CELEXA) 10 MG tablet Take 1 tablet (10 mg total) by mouth daily.  Marland Kitchen latanoprost (XALATAN) 0.005 % ophthalmic solution Place 1 drop into the right eye at bedtime.  . metoprolol succinate (TOPROL-XL) 25 MG 24 hr tablet Take 1 tablet (25 mg total) by mouth daily.  . Multiple Vitamins-Minerals (MULTIPLE VITAMINS/WOMENS) tablet Take 1 tablet by mouth daily.  Marland Kitchen omeprazole (PRILOSEC) 20 MG capsule Take 20 mg by mouth daily.  . ondansetron (ZOFRAN-ODT) 4 MG disintegrating tablet Take by mouth.  . triamterene-hydrochlorothiazide (MAXZIDE-25) 37.5-25 MG tablet TAKE 1 TABLET BY MOUTH EVERY DAY   No facility-administered encounter medications on file as of 07/10/2019.     Review of Systems  Review of Systems    Constitutional: Negative for activity change, fatigue and fever.  HENT: Negative for sinus pressure, sinus pain and sore throat.   Respiratory: Negative for cough, shortness of breath and wheezing.   Cardiovascular: Negative for chest pain and palpitations.  Gastrointestinal: Negative for diarrhea, nausea and vomiting.  Musculoskeletal: Negative for arthralgias.  Neurological: Negative for dizziness.  Psychiatric/Behavioral: Negative for sleep disturbance. The patient is not nervous/anxious.      Physical Exam  BP 124/72 (BP Location: Left Arm, Cuff Size: Normal)   Pulse 80   Temp 98.1 F (36.7 C) (Temporal)   Ht 5' (1.524 m)   Wt 139 lb (63 kg)   SpO2 95%   BMI 27.15  kg/m   Wt Readings from Last 5 Encounters:  07/10/19 139 lb (63 kg)  06/25/19 141 lb 3.2 oz (64 kg)  03/06/19 152 lb (68.9 kg)  11/08/18 171 lb 9.6 oz (77.8 kg)  12/12/17 160 lb 12 oz (72.9 kg)    BMI Readings from Last 5 Encounters:  07/10/19 27.15 kg/m  06/25/19 26.94 kg/m  03/06/19 29.69 kg/m  11/08/18 34.66 kg/m  12/12/17 32.47 kg/m     Physical Exam Vitals and nursing note reviewed.  Constitutional:      General: She is not in acute distress.    Appearance: Normal appearance. She is normal weight.  HENT:     Head: Normocephalic and atraumatic.     Right Ear: External ear normal.     Left Ear: External ear normal.  Cardiovascular:     Rate and Rhythm: Normal rate and regular rhythm.     Pulses: Normal pulses.     Heart sounds: Normal heart sounds. No murmur heard.   Pulmonary:     Effort: Pulmonary effort is normal. No respiratory distress.     Breath sounds: Normal breath sounds. No decreased air movement. No decreased breath sounds, wheezing or rales.  Musculoskeletal:     Cervical back: Normal range of motion.  Skin:    General: Skin is warm and dry.     Capillary Refill: Capillary refill takes less than 2 seconds.  Neurological:     General: No focal deficit present.      Mental Status: She is alert and oriented to person, place, and time. Mental status is at baseline.     Gait: Gait normal.  Psychiatric:        Mood and Affect: Mood normal.        Behavior: Behavior normal.        Thought Content: Thought content normal.        Judgment: Judgment normal.       Assessment & Plan:   OSA (obstructive sleep apnea) Patient resumed BiPAP 2 days ago  Plan: Remain on BiPAP Can consider repeat home sleep study in the future if patient continues to have successful weight loss We will bring patient back in 3 months to see DK and document BiPAP compliance  Former smoker Former smoker 40+ pack year smoking history  Plan: Chest x-ray today    Return in about 3 months (around 10/10/2019), or if symptoms worsen or fail to improve, for [facility] - Dr. Mortimer Fries.   Lauraine Rinne, NP 07/10/2019   This appointment required 34 minutes of patient care (this includes precharting, chart review, review of results, face-to-face care, etc.).

## 2019-07-10 ENCOUNTER — Ambulatory Visit (INDEPENDENT_AMBULATORY_CARE_PROVIDER_SITE_OTHER): Payer: Medicare Other | Admitting: Pulmonary Disease

## 2019-07-10 ENCOUNTER — Ambulatory Visit
Admission: RE | Admit: 2019-07-10 | Discharge: 2019-07-10 | Disposition: A | Discharge status: Home / Self Care | Payer: Medicare Other | Attending: Pulmonary Disease | Admitting: Pulmonary Disease

## 2019-07-10 ENCOUNTER — Other Ambulatory Visit: Payer: Self-pay

## 2019-07-10 ENCOUNTER — Encounter: Payer: Self-pay | Admitting: Pulmonary Disease

## 2019-07-10 ENCOUNTER — Ambulatory Visit
Admission: RE | Admit: 2019-07-10 | Discharge: 2019-07-10 | Disposition: A | Discharge status: Home / Self Care | Payer: Medicare Other | Source: Ambulatory Visit | Attending: Pulmonary Disease | Admitting: Pulmonary Disease

## 2019-07-10 VITALS — BP 124/72 | HR 80 | Temp 98.1°F | Ht 60.0 in | Wt 139.0 lb

## 2019-07-10 DIAGNOSIS — I10 Essential (primary) hypertension: Secondary | ICD-10-CM | POA: Diagnosis not present

## 2019-07-10 DIAGNOSIS — G4733 Obstructive sleep apnea (adult) (pediatric): Secondary | ICD-10-CM | POA: Diagnosis not present

## 2019-07-10 DIAGNOSIS — Z87891 Personal history of nicotine dependence: Secondary | ICD-10-CM | POA: Diagnosis not present

## 2019-07-10 DIAGNOSIS — R911 Solitary pulmonary nodule: Secondary | ICD-10-CM | POA: Diagnosis not present

## 2019-07-10 NOTE — Assessment & Plan Note (Signed)
Former smoker 40+ pack year smoking history  Plan: Chest x-ray today

## 2019-07-10 NOTE — Assessment & Plan Note (Signed)
Patient resumed BiPAP 2 days ago  Plan: Remain on BiPAP Can consider repeat home sleep study in the future if patient continues to have successful weight loss We will bring patient back in 3 months to see DK and document BiPAP compliance

## 2019-07-10 NOTE — Patient Instructions (Addendum)
You were seen today by Lauraine Rinne, NP  for:   1. OSA (obstructive sleep apnea)  We recommend that you continue using your BIPAP daily >>>Keep up the hard work using your device >>> Goal should be wearing this for the entire night that you are sleeping, at least 4 to 6 hours  Remember:  . Do not drive or operate heavy machinery if tired or drowsy.  . Please notify the supply company and office if you are unable to use your device regularly due to missing supplies or machine being broken.  . Work on maintaining a healthy weight and following your recommended nutrition plan  . Maintain proper daily exercise and movement  . Maintaining proper use of your device can also help improve management of other chronic illnesses such as: Blood pressure, blood sugars, and weight management.   BiPAP/ CPAP Cleaning:  >>>Clean weekly, with Dawn soap, and bottle brush.  Set up to air dry. >>> Wipe mask out daily with wet wipe or towelette  2. Former smoker  - DG Chest 2 View; Future   We recommend today:  Orders Placed This Encounter  Procedures  . DG Chest 2 View    Standing Status:   Future    Standing Expiration Date:   01/09/2020    Order Specific Question:   Reason for Exam (SYMPTOM  OR DIAGNOSIS REQUIRED)    Answer:   former smoker    Order Specific Question:   Preferred imaging location?    Answer:    [facility]    Order Specific Question:   Radiology Contrast Protocol - do NOT remove file path    Answer:   [email or web address]   Orders Placed This Encounter  Procedures  . DG Chest 2 View   No orders of the defined types were placed in this encounter.   Follow Up:    Return in about 3 months (around 10/10/2019), or if symptoms worsen or fail to improve, for [facility] - Dr. Mortimer Fries.   Please do your part to reduce the spread of COVID-19:      Reduce your risk of any infection  and COVID19 by using the similar precautions  used for avoiding the common cold or flu:  Marland Kitchen Wash your hands often with soap and warm water for at least 20 seconds.  If soap and water are not readily available, use an alcohol-based hand sanitizer with at least 60% alcohol.  . If coughing or sneezing, cover your mouth and nose by coughing or sneezing into the elbow areas of your shirt or coat, into a tissue or into your sleeve (not your hands). Langley Gauss A MASK when in public  . Avoid shaking hands with others and consider head nods or verbal greetings only. . Avoid touching your eyes, nose, or mouth with unwashed hands.  . Avoid close contact with people who are sick. . Avoid places or events with large numbers of people in one location, like concerts or sporting events. . If you have some symptoms but not all symptoms, continue to monitor at home and seek medical attention if your symptoms worsen. . If you are having a medical emergency, call 911.   [place] / e-Visit: [email or web address]         [facility]: [facility]: [phone]                   [facility]: [phone]     It is flu season:   >>> Best ways to protect herself from the flu: Receive the yearly flu vaccine, practice good hand hygiene washing with soap and also using hand sanitizer when available, eat a nutritious meals, get adequate rest, hydrate appropriately   Please contact the office if your symptoms worsen or you have concerns that you are not improving.   Thank you for choosing [facility] for your healthcare, and for allowing [place] to partner with you on your healthcare journey. I am thankful to be able to provide care to you today.   Wyn Quaker FNP-C

## 2019-07-11 ENCOUNTER — Encounter: Payer: Self-pay | Admitting: Family

## 2019-07-11 NOTE — Progress Notes (Signed)
Chest x-ray results have come back.  Potential pulmonary nodule at apical left hemothorax.  Given smoking history as well as no CT axial imaging I believe it is reasonable to order a CT chest without contrast.  Please have this be completed within the next 1 to 2 weeks.  To further evaluate this abnormal x-ray.  The CT chest will be a to give [place] a much better image to see if there is in fact a pulmonary nodule.Please go ahead and place the order for the CT chest.Please contact the patient and get him scheduled for this.  The soonest we get this completed the better. Wyn Quaker FNP

## 2019-07-13 ENCOUNTER — Other Ambulatory Visit: Payer: Self-pay

## 2019-07-13 DIAGNOSIS — R918 Other nonspecific abnormal finding of lung field: Secondary | ICD-10-CM

## 2019-07-16 ENCOUNTER — Other Ambulatory Visit: Payer: Self-pay | Admitting: Family

## 2019-07-16 DIAGNOSIS — I10 Essential (primary) hypertension: Secondary | ICD-10-CM

## 2019-07-16 MED ORDER — POTASSIUM CHLORIDE ER 10 MEQ PO TBCR: 10.0000 meq | EXTENDED_RELEASE_TABLET | ORAL | 1 refills | Status: DC

## 2019-07-24 ENCOUNTER — Other Ambulatory Visit (INDEPENDENT_AMBULATORY_CARE_PROVIDER_SITE_OTHER): Payer: Medicare Other

## 2019-07-24 ENCOUNTER — Other Ambulatory Visit: Payer: Self-pay

## 2019-07-24 DIAGNOSIS — I10 Essential (primary) hypertension: Secondary | ICD-10-CM | POA: Diagnosis not present

## 2019-07-24 LAB — BASIC METABOLIC PANEL
BUN: 21 mg/dL (ref 6–23)
CO2: 27 mEq/L (ref 19–32)
Calcium: 9.9 mg/dL (ref 8.4–10.5)
Chloride: 100 mEq/L (ref 96–112)
Creatinine, Ser: 1.3 mg/dL — ABNORMAL HIGH (ref 0.40–1.20)
GFR: 39.61 mL/min — ABNORMAL LOW (ref >60.00)
Glucose, Bld: 88 mg/dL (ref 70–99)
Potassium: 3.4 mEq/L — ABNORMAL LOW (ref 3.5–5.1)
Sodium: 136 mEq/L (ref 135–145)

## 2019-07-25 ENCOUNTER — Other Ambulatory Visit: Payer: Self-pay | Admitting: Internal Medicine

## 2019-07-25 DIAGNOSIS — E876 Hypokalemia: Secondary | ICD-10-CM

## 2019-07-25 DIAGNOSIS — I10 Essential (primary) hypertension: Secondary | ICD-10-CM

## 2019-07-25 MED ORDER — POTASSIUM CHLORIDE ER 10 MEQ PO TBCR: 10.0000 meq | EXTENDED_RELEASE_TABLET | Freq: Every day | ORAL | 1 refills | Status: DC

## 2019-07-27 ENCOUNTER — Other Ambulatory Visit: Payer: Self-pay

## 2019-07-27 ENCOUNTER — Ambulatory Visit
Admission: RE | Admit: 2019-07-27 | Discharge: 2019-07-27 | Disposition: A | Discharge status: Home / Self Care | Payer: Medicare Other | Source: Ambulatory Visit | Attending: Pulmonary Disease | Admitting: Pulmonary Disease

## 2019-07-27 DIAGNOSIS — J432 Centrilobular emphysema: Secondary | ICD-10-CM | POA: Diagnosis not present

## 2019-07-27 DIAGNOSIS — R918 Other nonspecific abnormal finding of lung field: Secondary | ICD-10-CM | POA: Insufficient documentation

## 2019-07-30 ENCOUNTER — Telehealth: Payer: Self-pay

## 2019-07-30 ENCOUNTER — Ambulatory Visit
Admission: RE | Admit: 2019-07-30 | Discharge: 2019-07-30 | Disposition: A | Discharge status: Home / Self Care | Payer: Medicare Other | Source: Ambulatory Visit | Attending: Family | Admitting: Family

## 2019-07-30 DIAGNOSIS — M85852 Other specified disorders of bone density and structure, left thigh: Secondary | ICD-10-CM | POA: Diagnosis not present

## 2019-07-30 DIAGNOSIS — Z1231 Encounter for screening mammogram for malignant neoplasm of breast: Secondary | ICD-10-CM | POA: Diagnosis not present

## 2019-07-30 DIAGNOSIS — M810 Age-related osteoporosis without current pathological fracture: Secondary | ICD-10-CM | POA: Diagnosis not present

## 2019-07-30 DIAGNOSIS — R29890 Loss of height: Secondary | ICD-10-CM | POA: Diagnosis not present

## 2019-07-30 DIAGNOSIS — Z780 Asymptomatic menopausal state: Secondary | ICD-10-CM | POA: Diagnosis not present

## 2019-07-30 NOTE — Telephone Encounter (Signed)
-----   Message from Burnard Hawthorne, [place] sent at 07/30/2019 10:00 AM EDT ----- Call pt She has upcoming appt with Dr Murlean Iba nephrology and I strongly advise that she keeps this appointment I can see appt is there in epic but not details, ensure pt has all the info and has it written down If not, please look up dr singh's office and provide her with number ----- Message ----- From: Ezequiel Ganser, CMA Sent: 07/30/2019   9:06 AM EDT To: Burnard Hawthorne, FNP  Christie Stevenson declined an upcoming appointment with the nephrologist. Patient called the office on 07/11/2019 and states that she has not heard from them as of then.

## 2019-07-30 NOTE — Telephone Encounter (Signed)
Attempted to call patient and inform them that they had an upcoming appointment with Nephrology.  The appointment is with Christie Iba, MD at [place] [address] Phone: [phone] Will give patient the number to call and confirm appointment details as she states she has not had any contact with nephrology and has no appointment marked on her calender.

## 2019-07-31 ENCOUNTER — Telehealth: Payer: Self-pay | Admitting: Family

## 2019-07-31 NOTE — Progress Notes (Signed)
Thank you   Christie Stevenson  

## 2019-07-31 NOTE — Telephone Encounter (Signed)
Call patient Reviewed appointment to discuss findings on CT chest.    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  In regards to degenerative changes in her thoracic spine, we can discuss this in the office.    In regards to the small liver lesion, I recommend doing a dedicated liver ultrasound to further characterize  We can discuss all in visit.

## 2019-07-31 NOTE — Telephone Encounter (Signed)
-----   Message from Lauraine Rinne, NP sent at 07/30/2019  5:35 PM EDT ----- Please let the patient know that we have received her CT chest results.  There are no concerning pulmonary nodules or masses seen.  This is great news.  There are features of emphysema as well as chronic bronchitis.  No convincing CT imaging to correlate with the chest x-ray imaging.  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

## 2019-07-31 NOTE — Telephone Encounter (Signed)
Called and gave the patient the information about her upcoming appointment with nephrology. Gave Lendy the number to Dr. Ledell Noss office and instructed her to call and obtain more information about the appointment.

## 2019-08-03 NOTE — Telephone Encounter (Signed)
Left Message to call back.

## 2019-08-08 ENCOUNTER — Other Ambulatory Visit: Payer: Self-pay | Admitting: Family

## 2019-08-08 DIAGNOSIS — I10 Essential (primary) hypertension: Secondary | ICD-10-CM

## 2019-08-08 DIAGNOSIS — E876 Hypokalemia: Secondary | ICD-10-CM

## 2019-08-10 NOTE — Telephone Encounter (Signed)
Christie Stevenson verbalized understanding and had no further questions. Christie Stevenson states that her liver lesion has been present for around 15 years. She wants to discuss all at her next appointment.

## 2019-08-11 DIAGNOSIS — Z20822 Contact with and (suspected) exposure to covid-19: Secondary | ICD-10-CM | POA: Diagnosis not present

## 2019-08-11 DIAGNOSIS — Z03818 Encounter for observation for suspected exposure to other biological agents ruled out: Secondary | ICD-10-CM | POA: Diagnosis not present

## 2019-08-11 NOTE — Telephone Encounter (Signed)
Appt 09/10/19 Discuss liver lesion and [place]

## 2019-08-24 ENCOUNTER — Other Ambulatory Visit: Payer: Self-pay | Admitting: *Deleted

## 2019-08-24 DIAGNOSIS — I6529 Occlusion and stenosis of unspecified carotid artery: Secondary | ICD-10-CM

## 2019-08-30 DIAGNOSIS — R809 Proteinuria, unspecified: Secondary | ICD-10-CM | POA: Diagnosis not present

## 2019-08-30 DIAGNOSIS — N1832 Chronic kidney disease, stage 3b: Secondary | ICD-10-CM | POA: Diagnosis not present

## 2019-08-30 DIAGNOSIS — I10 Essential (primary) hypertension: Secondary | ICD-10-CM | POA: Diagnosis not present

## 2019-09-04 NOTE — Progress Notes (Deleted)
Virtual Visit via Video Note  I connected with@  on 09/04/19 at  9:00 AM EDT by a video enabled telemedicine application and verified that I am speaking with the correct person using two identifiers.  Location patient: home Location provider:work or home office Persons participating in the virtual visit: patient, provider  I discussed the limitations of evaluation and management by telemedicine and the availability of in person appointments. The patient expressed understanding and agreed to proceed.   HPI: Reviewed appointment to discuss findings on CT chest.    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  In regards to degenerative changes in her thoracic spine, we can discuss this in the office.    In regards to the small liver lesion, I recommend doing a dedicated liver ultrasound to further characterize  We can discuss all in visit.    ROS: See pertinent positives and negatives per HPI.  Past Medical History:  Diagnosis Date  . 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ease   . Chronic kidney disease    STAGE 3  . Depression   . GERD (gastroesophageal reflux disease)   . History of colon polyps   . History of diverticulitis   . History of kidney stones   . Hyperlipidemia   . Hypertension   . Migraine   . Osteoporosis     Past Surgical History:  Procedure Laterality Date  . CATARACT EXTRACTION Left 2014  . CATARACT EXTRACTION W/PHACO Right 04/01/2015   Procedure: CATARACT EXTRACTION PHACO AND INTRAOCULAR LENS PLACEMENT (IOC);  Surgeon: [name], MD;  Location: [facility] ORS;  Service: Ophthalmology;  Laterality: Right;  [place] 00:47 AP% 20.8 CDE 9.76 fluid pack lot # [id number]  . COLONOSCOPY    . COLONOSCOPY WITH PROPOFOL N/A 05/26/2015   Procedure: COLONOSCOPY WITH PROPOFOL;   Surgeon: [name], MD;  Location: [facility] ENDOSCOPY;  Service: Endoscopy;  Laterality: N/A;  . TUBAL LIGATION      Family History  Problem Relation Age of Onset  . Arthritis Mother   . Hypertension Mother   . Osteoporosis Mother   . Osteoporosis Sister   . Osteoporosis Brother   . Heart disease Brother 68       CABG and stents  . Diabetes Daughter   . Diabetes Son   . Cancer Paternal Uncle        Prostate Cancer  . Cancer Sister        Breast Cancer   . Osteoporosis Sister   . Breast cancer Sister        mid 58's  . Osteoporosis Brother   . Heart attack Brother 32  . Arrhythmia Brother        s/p pacermaker  . Diabetes Daughter     SOCIAL HX: ***   Current Outpatient Medications:  .  atorvastatin (LIPITOR) 40 MG tablet, TAKE 1 TABLET BY MOUTH EVERY DAY, Disp: 90 tablet, Rfl: 1 .  Calcium Carb-Cholecalciferol (CALCIUM 600 + D PO), Take 600 mg by mouth 2 (two) times daily., Disp: , Rfl:  .  Calcium Carbonate-Vitamin D 600-200 MG-UNIT TABS, Take by mouth., Disp: , Rfl:  .  citalopram (CELEXA) 10 MG tablet, Take 1 tablet (10 mg total) by mouth daily., Disp: 90 tablet, Rfl: 2 .  latanoprost (XALATAN) 0.005 % ophthalmic solution, Place 1 drop into the right eye at  bedtime., Disp: , Rfl:  .  metoprolol succinate (TOPROL-XL) 25 MG 24 hr tablet, Take 1 tablet (25 mg total) by mouth daily., Disp: 90 tablet, Rfl: 1 .  Multiple Vitamins-Minerals (MULTIPLE VITAMINS/WOMENS) tablet, Take 1 tablet by mouth daily., Disp: , Rfl:  .  omeprazole (PRILOSEC) 20 MG capsule, Take 20 mg by mouth daily., Disp: , Rfl:  .  ondansetron (ZOFRAN-ODT) 4 MG disintegrating tablet, Take by mouth., Disp: , Rfl:  .  potassium chloride (KLOR-CON) 10 MEQ tablet, TAKE 1 TABLET BY MOUTH EVERY OTHER DAY., Disp: 60 tablet, Rfl: 1 .  triamterene-hydrochlorothiazide (MAXZIDE-25) 37.5-25 MG tablet, TAKE 1 TABLET BY MOUTH EVERY DAY, Disp: 90 tablet, Rfl: 1  EXAM:  VITALS per patient if applicable:  GENERAL:  alert, oriented, appears well and in no acute distress  HEENT: atraumatic, conjunttiva clear, no obvious abnormalities on inspection of external nose and ears  NECK: normal movements of the head and neck  LUNGS: on inspection no signs of respiratory distress, breathing rate appears normal, no obvious gross SOB, gasping or wheezing  CV: no obvious cyanosis  MS: moves all visible extremities without noticeable abnormality  PSYCH/NEURO: pleasant and cooperative, no obvious depression or anxiety, speech and thought processing grossly intact  ASSESSMENT AND PLAN:  Discussed the following assessment and plan:  No diagnosis found.  -we discussed possible serious and likely etiologies, options for evaluation and workup, limitations of telemedicine visit vs in person visit, treatment, treatment risks and precautions. Pt prefers to treat via telemedicine empirically rather then risking or undertaking an in person visit at this moment.  Work/School slipped offered: Advised to seek prompt follow up telemedicine visit or in person care if worsening, new symptoms arise, or if is not improving with treatment. Did let her know that I only do telemedicine on Tuesdays and Thursdays for [place] and advised follow up visit with PCP or [facility] if needs follow up or if any further questions arise to avoid any delays.   I discussed the assessment and treatment plan with the patient. The patient was provided an opportunity to ask questions and all were answered. The patient agreed with the plan and demonstrated an understanding of the instructions.   The patient was advised to call back or seek an in-person evaluation if the symptoms worsen or if the condition fails to improve as anticipated.   [name], FNP

## 2019-09-05 ENCOUNTER — Ambulatory Visit (INDEPENDENT_AMBULATORY_CARE_PROVIDER_SITE_OTHER): Payer: Medicare Other

## 2019-09-05 VITALS — Ht 60.0 in | Wt 139.0 lb

## 2019-09-05 DIAGNOSIS — Z0000 Encounter for general adult medical examination without abnormal findings: Secondary | ICD-10-CM

## 2019-09-05 NOTE — Patient Instructions (Addendum)
Christie Stevenson , Thank you for taking time to come for your Medicare Wellness Visit. I appreciate your ongoing commitment to your health goals. Please review the following plan we discussed and let me know if I can assist you in the future.   These are the goals we discussed: Goals      Patient Stated   .  I would like to lose about 10-15lb (pt-stated)       This is a list of the screening recommended for you and due dates:  Health Maintenance  Topic Date Due  .  Hepatitis C: One time screening is recommended by Center for Disease Control  (CDC) for  adults born from [birth year] through 1964.   Never done  . Tetanus Vaccine  Never done  . Flu Shot  08/19/2019  . Colon Cancer Screening  05/25/2020  . DEXA scan (bone density measurement)  Completed  . COVID-19 Vaccine  Completed  . Pneumonia vaccines  Completed    Immunizations Immunization History  Administered Date(s) Administered  . Influenza Split 10/19/2013  . Influenza, High Dose Seasonal PF 10/15/2016, 09/26/2017  . Influenza-Unspecified 12/02/2012, 10/19/2015, 10/26/2016, 10/19/2018  . PFIZER SARS-COV-2 Vaccination 02/22/2019, 03/15/2019  . Pneumococcal Conjugate-13 12/02/2012  . Pneumococcal Polysaccharide-23 03/17/2015   Keep all routine maintenance appointments.   Follow up 09/10/19 @ 9:00  Advanced directives: End of life planning; Advance aging; Advanced directives discussed.  Copy of current HCPOA/Living Will requested.    Conditions/risks identified: none new  Follow up in one year for your annual wellness visit   Preventive Care 65 Years and Older, Female Preventive care refers to lifestyle choices and visits with your health care provider that can promote health and wellness. What does preventive care include?  A yearly physical exam. This is also called an annual well check.  Dental exams once or twice a year.  Routine eye exams. Ask your health care provider how often you should have your eyes  checked.  Personal lifestyle choices, including:  Daily care of your teeth and gums.  Regular physical activity.  Eating a healthy diet.  Avoiding tobacco and drug use.  Limiting alcohol use.  Practicing safe sex.  Taking low-dose aspirin every day.  Taking vitamin and mineral supplements as recommended by your health care provider. What happens during an annual well check? The services and screenings done by your health care provider during your annual well check will depend on your age, overall health, lifestyle risk factors, and family history of disease. Counseling  Your health care provider may ask you questions about your:  Alcohol use.  Tobacco use.  Drug use.  Emotional well-being.  Home and relationship well-being.  Sexual activity.  Eating habits.  History of falls.  Memory and ability to understand (cognition).  Work and work Statistician.  Reproductive health. Screening  You may have the following tests or measurements:  Height, weight, and BMI.  Blood pressure.  Lipid and cholesterol levels. These may be checked every 5 years, or more frequently if you are over 23 years old.  Skin check.  Lung cancer screening. You may have this screening every year starting at age 4 if you have a 30-pack-year history of smoking and currently smoke or have quit within the past 15 years.  Fecal occult blood test (FOBT) of the stool. You may have this test every year starting at age 25.  Flexible sigmoidoscopy or colonoscopy. You may have a sigmoidoscopy every 5 years or a colonoscopy every 10 years  starting at age 61.  Hepatitis C blood test.  Hepatitis B blood test.  Sexually transmitted disease (STD) testing.  Diabetes screening. This is done by checking your blood sugar (glucose) after you have not eaten for a while (fasting). You may have this done every 1-3 years.  Bone density scan. This is done to screen for osteoporosis. You may have this done  starting at age 70.  Mammogram. This may be done every 1-2 years. Talk to your health care provider about how often you should have regular mammograms. Talk with your health care provider about your test results, treatment options, and if necessary, the need for more tests. Vaccines  Your health care provider may recommend certain vaccines, such as:  Influenza vaccine. This is recommended every year.  Tetanus, diphtheria, and acellular pertussis (Tdap, Td) vaccine. You may need a Td booster every 10 years.  Zoster vaccine. You may need this after age 41.  Pneumococcal 13-valent conjugate (PCV13) vaccine. One dose is recommended after age 71.  Pneumococcal polysaccharide (PPSV23) vaccine. One dose is recommended after age 37. Talk to your health care provider about which screenings and vaccines you need and how often you need them. This information is not intended to replace advice given to you by your health care provider. Make sure you discuss any questions you have with your health care provider. Document Released: 01/31/2015 Document Revised: 09/24/2015 Document Reviewed: 11/05/2014 Elsevier Interactive Patient Education  2017 [place] Prevention in the Home Falls can cause injuries. They can happen to people of all ages. There are many things you can do to make your home safe and to help prevent falls. What can I do on the outside of my home?  Regularly fix the edges of walkways and driveways and fix any cracks.  Remove anything that might make you trip as you walk through a door, such as a raised step or threshold.  Trim any bushes or trees on the path to your home.  Use bright outdoor lighting.  Clear any walking paths of anything that might make someone trip, such as rocks or tools.  Regularly check to see if handrails are loose or broken. Make sure that both sides of any steps have handrails.  Any raised decks and porches should have guardrails on the  edges.  Have any leaves, snow, or ice cleared regularly.  Use sand or salt on walking paths during winter.  Clean up any spills in your garage right away. This includes oil or grease spills. What can I do in the bathroom?  Use night lights.  Install grab bars by the toilet and in the tub and shower. Do not use towel bars as grab bars.  Use non-skid mats or decals in the tub or shower.  If you need to sit down in the shower, use a plastic, non-slip stool.  Keep the floor dry. Clean up any water that spills on the floor as soon as it happens.  Remove soap buildup in the tub or shower regularly.  Attach bath mats securely with double-sided non-slip rug tape.  Do not have throw rugs and other things on the floor that can make you trip. What can I do in the bedroom?  Use night lights.  Make sure that you have a light by your bed that is easy to reach.  Do not use any sheets or blankets that are too big for your bed. They should not hang down onto the floor.  Have a  firm chair that has side arms. You can use this for support while you get dressed.  Do not have throw rugs and other things on the floor that can make you trip. What can I do in the kitchen?  Clean up any spills right away.  Avoid walking on wet floors.  Keep items that you use a lot in easy-to-reach places.  If you need to reach something above you, use a strong step stool that has a grab bar.  Keep electrical cords out of the way.  Do not use floor polish or wax that makes floors slippery. If you must use wax, use non-skid floor wax.  Do not have throw rugs and other things on the floor that can make you trip. What can I do with my stairs?  Do not leave any items on the stairs.  Make sure that there are handrails on both sides of the stairs and use them. Fix handrails that are broken or loose. Make sure that handrails are as long as the stairways.  Check any carpeting to make sure that it is firmly  attached to the stairs. Fix any carpet that is loose or worn.  Avoid having throw rugs at the top or bottom of the stairs. If you do have throw rugs, attach them to the floor with carpet tape.  Make sure that you have a light switch at the top of the stairs and the bottom of the stairs. If you do not have them, ask someone to add them for you. What else can I do to help prevent falls?  Wear shoes that:  Do not have high heels.  Have rubber bottoms.  Are comfortable and fit you well.  Are closed at the toe. Do not wear sandals.  If you use a stepladder:  Make sure that it is fully opened. Do not climb a closed stepladder.  Make sure that both sides of the stepladder are locked into place.  Ask someone to hold it for you, if possible.  Clearly mark and make sure that you can see:  Any grab bars or handrails.  First and last steps.  Where the edge of each step is.  Use tools that help you move around (mobility aids) if they are needed. These include:  Canes.  Walkers.  Scooters.  Crutches.  Turn on the lights when you go into a dark area. Replace any light bulbs as soon as they burn out.  Set up your furniture so you have a clear path. Avoid moving your furniture around.  If any of your floors are uneven, fix them.  If there are any pets around you, be aware of where they are.  Review your medicines with your doctor. Some medicines can make you feel dizzy. This can increase your chance of falling. Ask your doctor what other things that you can do to help prevent falls. This information is not intended to replace advice given to you by your health care provider. Make sure you discuss any questions you have with your health care provider. Document Released: 10/31/2008 Document Revised: 06/12/2015 Document Reviewed: 02/08/2014 Elsevier Interactive Patient Education  2017 [employer].

## 2019-09-05 NOTE — Progress Notes (Addendum)
Subjective:   Christie Stevenson is a 79 y.o. female who presents for Medicare Annual (Subsequent) preventive examination.  Review of Systems    No ROS.  Medicare Wellness Virtual Visit.   Cardiac Risk Factors include: advanced age (>41men, >25 women);hypertension     Objective:    Today's Vitals   09/05/19 1308  Weight: 139 lb (63 kg)  Height: 5' (1.524 m)   Body mass index is 27.15 kg/m.  Advanced Directives 09/05/2019 02/22/2017 02/20/2016 05/26/2015 02/19/2015  Does Patient Have a Medical Advance Directive? Yes No No No No  Type of Paramedic of [place];Living will - - - -  Does patient want to make changes to medical advance directive? No - Patient declined - - - -  Copy of [place] in Chart? No - copy requested - - - -  Would patient like information on creating a medical advance directive? - Yes (MAU/Ambulatory/Procedural Areas - Information given) No - Patient declined No - patient declined information Yes - Educational materials given    Current Medications (verified) Outpatient Encounter Medications as of 09/05/2019  Medication Sig  . Apoaequorin 10 MG CAPS Take by mouth.  Marland Kitchen atorvastatin (LIPITOR) 40 MG tablet TAKE 1 TABLET BY MOUTH EVERY DAY  . Calcium Carb-Cholecalciferol (CALCIUM 600 + D PO) Take 600 mg by mouth 2 (two) times daily.  . Calcium Carbonate-Vitamin D 600-200 MG-UNIT TABS Take by mouth.  . citalopram (CELEXA) 10 MG tablet Take 1 tablet (10 mg total) by mouth daily.  Marland Kitchen latanoprost (XALATAN) 0.005 % ophthalmic solution Place 1 drop into the right eye at bedtime.  . metoprolol succinate (TOPROL-XL) 25 MG 24 hr tablet Take 1 tablet (25 mg total) by mouth daily.  . Multiple Vitamins-Minerals (MULTIPLE VITAMINS/WOMENS) tablet Take 1 tablet by mouth daily.  Marland Kitchen omeprazole (PRILOSEC) 20 MG capsule Take 20 mg by mouth daily.  . ondansetron (ZOFRAN-ODT) 4 MG disintegrating tablet Take by mouth.  . potassium chloride (KLOR-CON)  10 MEQ tablet TAKE 1 TABLET BY MOUTH EVERY OTHER DAY.  Marland Kitchen triamterene-hydrochlorothiazide (MAXZIDE-25) 37.5-25 MG tablet TAKE 1 TABLET BY MOUTH EVERY DAY   No facility-administered encounter medications on file as of 09/05/2019.    Allergies (verified) Patient has no known allergies.   History: Past Medical History:  Diagnosis Date  . 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ease   . Chronic kidney disease    STAGE 3  . Depression   . GERD (gastroesophageal reflux disease)   . History of colon polyps   . History of diverticulitis   . History of kidney stones   . Hyperlipidemia   . Hypertension   . Migraine   . Osteoporosis    Past Surgical History:  Procedure Laterality Date  . CATARACT EXTRACTION Left 2014  . CATARACT EXTRACTION W/PHACO Right 04/01/2015   Procedure: CATARACT EXTRACTION PHACO AND INTRAOCULAR LENS PLACEMENT (IOC);  Surgeon: [name], MD;  Location: [facility] ORS;  Service: Ophthalmology;  Laterality: Right;  [place] 00:47 AP% 20.8 CDE 9.76 fluid pack lot # [id number]  . COLONOSCOPY    . COLONOSCOPY WITH PROPOFOL N/A 05/26/2015   Procedure: COLONOSCOPY WITH PROPOFOL;  Surgeon: [name], MD;  Location: [facility] ENDOSCOPY;  Service: Endoscopy;  Laterality: N/A;  . TUBAL LIGATION     Family History  Problem Relation Age of Onset  . Arthritis Mother   . Hypertension Mother   . Osteoporosis Mother   . Osteoporosis Sister   . Osteoporosis Brother   .  Heart disease Brother 60       CABG and stents  . Diabetes Daughter   . Diabetes Son   . Cancer Paternal Uncle        Prostate Cancer  . Cancer Sister        Breast Cancer   . Osteoporosis Sister   . Breast cancer Sister        mid 82's  . Osteoporosis Brother   . Heart attack Brother 104  . Arrhythmia Brother        s/p pacermaker  . Diabetes Daughter    Social History   Socioeconomic History  . Marital status: Divorced    Spouse name: Not on file  . Number of children: 5  . Years of education:  53  . Highest education level: Not on file  Occupational History  . Occupation: Press photographer work    Comment: Retired  Immunologist  . Smoking status: Former Smoker    Packs/day: 1.00    Years: 40.00    Pack years: 40.00    Types: Cigarettes    Quit date: 07/22/1994    Years since quitting: 25.1  . Smokeless tobacco: Never Used  Vaping Use  . Vaping Use: Never used  Substance and Sexual Activity  . Alcohol use: No    Comment: 1 glass of wine on holidays.  . Drug use: No  . Sexual activity: Never  Other Topics Concern  . Not on file  Social History Narrative   Caffeine - 2 cups of coffee   Exercise - none regularly   Diet - tries to eat as healthy as possible but can do better      Does not have a Living Will         Social Determinants of Health   Financial Resource Strain: Low Risk   . Difficulty of Paying Living Expenses: Not hard at all  Food Insecurity: No Food Insecurity  . Worried About Charity fundraiser in the Last Year: Never true  . Ran Out of Food in the Last Year: Never true  Transportation Needs: No Transportation Needs  . Lack of Transportation (Medical): No  . Lack of Transportation (Non-Medical): No  Physical Activity:   . Days of Exercise per Week:   . Minutes of Exercise per Session:   Stress: No Stress Concern Present  . Feeling of Stress : Not at all  Social Connections: Unknown  . Frequency of Communication with Friends and Family: More than three times a week  . Frequency of Social Gatherings with Friends and Family: Not on file  . Attends Religious Services: Not on file  . Active Member of Clubs or Organizations: Not on file  . Attends Archivist Meetings: Not on file  . Marital Status: Not on file    Tobacco Counseling Counseling given: Not Answered   Clinical Intake:  Pre-visit preparation completed: Yes        Diabetes: No  How often do you need to have someone help you when you read instructions, pamphlets, or  other written materials from your doctor or pharmacy?: 1 - Never    Interpreter Needed?: No      Activities of Daily Living In your present state of health, do you have any difficulty performing the following activities: 09/05/2019 11/08/2018  Hearing? N N  Vision? N N  Difficulty concentrating or making decisions? N N  Walking or climbing stairs? N N  Dressing or bathing? N N  Doing  errands, shopping? N N  Preparing Food and eating ? N -  Using the Toilet? N -  In the past six months, have you accidently leaked urine? N -  Do you have problems with loss of bowel control? N -  Managing your Medications? N -  Managing your Finances? N -  Housekeeping or managing your Housekeeping? N -  Some recent data might be hidden    Patient Care Team: Burnard Hawthorne, FNP as PCP - General (Family Medicine)  Indicate any recent Medical Services you may have received from other than [facility] providers in the past year (date may be approximate).     Assessment:   This is a routine wellness examination for [facility].  I connected with Zenita today by telephone and verified that I am speaking with the correct person using two identifiers. Location patient: home Location provider: work Persons participating in the virtual visit: patient, Marine scientist.    I discussed the limitations, risks, security and privacy concerns of performing an evaluation and management service by telephone and the availability of in person appointments. I also discussed with the patient that there may be a patient responsible charge related to this service. The patient expressed understanding and verbally consented to this telephonic visit.    Interactive audio and video telecommunications were attempted between this provider and patient, however failed, due to patient having technical difficulties OR patient did not have access to video capability.  We continued and completed visit with audio only.  Some vital signs may be absent  or patient reported.   Hearing/Vision screen  Hearing Screening   125Hz  250Hz  500Hz  1000Hz  2000Hz  3000Hz  4000Hz  6000Hz  8000Hz   Right ear:           Left ear:           Comments: Patient is able to hear conversational tones without difficulty.  No issues reported.  Vision Screening Comments: Wears corrective lenses Visual acuity not assessed, virtual visit.  They have seen their ophthalmologist in the last 6 months.     Dietary issues and exercise activities discussed: Current Exercise Habits: Home exercise routine, Type of exercise: walking;calisthenics (stationary bike), Intensity: MildHealthy diet Good water intake Caffeine- 1 cup of coffee  Goals      Patient Stated   .  I would like to lose about 10-15lb (pt-stated)      Depression Screen PHQ 2/9 Scores 09/05/2019 06/25/2019 11/09/2018 02/22/2017 02/20/2016 02/19/2015 01/03/2014  PHQ - 2 Score 0 0 6 0 0 0 0  PHQ- 9 Score - 0 9 - - - -    Fall Risk Fall Risk  09/05/2019 06/25/2019 03/06/2019 11/08/2018 02/22/2017  Falls in the past year? 0 0 0 0 No  Number falls in past yr: 0 0 - - -  Injury with Fall? - 0 - - -  Follow up Falls evaluation completed Falls evaluation completed Falls evaluation completed Falls evaluation completed -   Handrails in use when climbing stairs? Yes  Home free of loose throw rugs in walkways, pet beds, electrical cords, etc? Yes  Adequate lighting in your home to reduce risk of falls? Yes   ASSISTIVE DEVICES UTILIZED TO PREVENT FALLS: Life alert? No  Use of a cane, walker or w/c? No  Grab bars in the bathroom? Yes  Shower chair or bench in shower? No  Elevated toilet seat or a handicapped toilet? No   TIMED UP AND GO:  Was the test performed? No . Virtual visit.  Cognitive  Function: Patient is alert and oriented x3.  Currently works in an [employer] manager.  Enjoys sodoku, crossword and weight.  MMSE - Mini Mental State Exam 02/19/2015  Orientation to time 5  Orientation to Place 5  Registration 3    Attention/ Calculation 5  Recall 3  Language- name 2 objects 2  Language- repeat 1  Language- follow 3 step command 3  Language- read & follow direction 1  Write a sentence 1  Copy design 1  Total score 30     6CIT Screen 09/05/2019 02/22/2017  What Year? 0 points 0 points  What month? 0 points 0 points  What time? - 0 points  Count back from 20 - 0 points  Months in reverse 0 points 0 points  Repeat phrase 0 points 0 points  Total Score - 0    Immunizations Immunization History  Administered Date(s) Administered  . Influenza Split 10/19/2013  . Influenza, High Dose Seasonal PF 10/15/2016, 09/26/2017  . Influenza-Unspecified 12/02/2012, 10/19/2015, 10/26/2016, 10/19/2018  . PFIZER SARS-COV-2 Vaccination 02/22/2019, 03/15/2019  . Pneumococcal Conjugate-13 12/02/2012  . Pneumococcal Polysaccharide-23 03/17/2015    Health Maintenance Health Maintenance  Topic Date Due  . Hepatitis C Screening  Never done  . TETANUS/TDAP  Never done  . INFLUENZA VACCINE  08/19/2019  . COLONOSCOPY  05/25/2020  . DEXA SCAN  Completed  . COVID-19 Vaccine  Completed  . PNA vac Low Risk Adult  Completed   Hepatitis C Screening- notes she had the screening in [place] some years ago.   Dental Screening: Recommended annual dental exams for proper oral hygiene. Visit every 6 months.   Community Resource Referral / Chronic Care Management: CRR required this visit?  No   CCM required this visit?  No      Plan:   Keep all routine maintenance appointments.   Follow up 09/10/19 @ 9:00  I have personally reviewed and noted the following in the patient's chart:   . Medical and social history . Use of alcohol, tobacco or illicit drugs  . Current medications and supplements . Functional ability and status . Nutritional status . Physical activity . Advanced directives . List of other physicians . Hospitalizations, surgeries, and ER visits in previous 12 months . Vitals . Screenings to  include cognitive, depression, and falls . Referrals and appointments  In addition, I have reviewed and discussed with patient certain preventive protocols, quality metrics, and best practice recommendations. A written personalized care plan for preventive services as well as general preventive health recommendations were provided to patient via mychart.     Varney Biles, LPN   0/09/2328    Agree with plan. [name], NP

## 2019-09-10 ENCOUNTER — Encounter: Payer: Self-pay | Admitting: Surgery

## 2019-09-10 ENCOUNTER — Ambulatory Visit: Payer: Medicare Other | Admitting: Surgery

## 2019-09-10 ENCOUNTER — Other Ambulatory Visit: Payer: Self-pay

## 2019-09-10 ENCOUNTER — Ambulatory Visit (INDEPENDENT_AMBULATORY_CARE_PROVIDER_SITE_OTHER): Payer: Medicare Other | Admitting: Family

## 2019-09-10 ENCOUNTER — Encounter: Payer: Self-pay | Admitting: Family

## 2019-09-10 ENCOUNTER — Ambulatory Visit (HOSPITAL_COMMUNITY)
Admission: RE | Admit: 2019-09-10 | Discharge: 2019-09-10 | Disposition: A | Discharge status: Home / Self Care | Payer: Medicare Other | Source: Ambulatory Visit | Attending: Surgery | Admitting: Surgery

## 2019-09-10 VITALS — BP 105/64 | HR 96 | Temp 97.7°F | Resp 20 | Ht 60.0 in | Wt 136.5 lb

## 2019-09-10 VITALS — BP 122/70 | HR 55 | Temp 98.0°F | Ht 60.0 in | Wt 137.2 lb

## 2019-09-10 DIAGNOSIS — Z515 Encounter for palliative care: Secondary | ICD-10-CM | POA: Diagnosis not present

## 2019-09-10 DIAGNOSIS — I10 Essential (primary) hypertension: Secondary | ICD-10-CM | POA: Diagnosis not present

## 2019-09-10 DIAGNOSIS — I6529 Occlusion and stenosis of unspecified carotid artery: Secondary | ICD-10-CM | POA: Insufficient documentation

## 2019-09-10 DIAGNOSIS — N189 Chronic kidney disease, unspecified: Secondary | ICD-10-CM

## 2019-09-10 DIAGNOSIS — I6521 Occlusion and stenosis of right carotid artery: Secondary | ICD-10-CM

## 2019-09-10 DIAGNOSIS — I700 Atherosclerosis of aorta: Secondary | ICD-10-CM | POA: Diagnosis not present

## 2019-09-10 DIAGNOSIS — K769 Liver disease, unspecified: Secondary | ICD-10-CM | POA: Insufficient documentation

## 2019-09-10 DIAGNOSIS — M810 Age-related osteoporosis without current pathological fracture: Secondary | ICD-10-CM | POA: Diagnosis not present

## 2019-09-10 LAB — VITAMIN D 25 HYDROXY (VIT D DEFICIENCY, FRACTURES): VITD: 35.22 ng/mL (ref 30.00–100.00)

## 2019-09-10 NOTE — Assessment & Plan Note (Signed)
Seen ct chest 2021; states has been there 10+ years. I advised dedicated image for further characterization and to ensure benign;  she politely declines any further work up

## 2019-09-10 NOTE — Progress Notes (Signed)
[facility]  Patient name: Christie Stevenson MRN: [id number] DOB: 06-Apr-1940 Sex: female   REQUESTING PROVIDER:    [name]   REASON FOR CONSULT:    Carotid stenosis  HISTORY OF PRESENT ILLNESS:   Christie Stevenson is a 79 y.o. female, who is referred for evaluation of carotid stenosis.  She has previously been seen at [facility] and had 40-59% right-sided stenosis which was asymptomatic.  She continues to be asymptomatic, specifically, she denies numbness or weakness in either extremity.  She denies slurred speech.  She denies amaurosis fugax.  She suffers from stage III chronic renal insufficiency.  She is medically managed for hypertension.  She takes a statin for hypercholesterolemia.  She does not take an aspirin as she states that she bleeds very easily.  PAST MEDICAL HISTORY    Past Medical History:  Diagnosis Date  . 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ease   . Chronic kidney disease    STAGE 3  . Depression   . GERD (gastroesophageal reflux disease)   . History of colon polyps   . History of diverticulitis   . History of kidney stones   . Hyperlipidemia   . Hypertension   . Migraine   . Osteoporosis      FAMILY HISTORY   Family History  Problem Relation Age of Onset  . Arthritis Mother   . Hypertension Mother   . Osteoporosis Mother   . Osteoporosis Sister   . Osteoporosis Brother   . Heart disease Brother 51       CABG and stents  . Diabetes Daughter   . Diabetes Son   . Cancer Paternal Uncle        Prostate Cancer  . Cancer Sister        Breast Cancer   . Osteoporosis Sister   . Breast cancer Sister        mid 39's  . Osteoporosis Brother   . Heart attack Brother 69  . Arrhythmia Brother        s/p pacermaker  . Diabetes Daughter     SOCIAL HISTORY:   Social History   Socioeconomic History  . Marital status: Divorced    Spouse name: Not on file  . Number of  children: 5  . Years of education: 71  . Highest education level: Not on file  Occupational History  . Occupation: Press photographer work    Comment: Retired  Immunologist  . Smoking status: Former Smoker    Packs/day: 1.00    Years: 40.00    Pack years: 40.00    Types: Cigarettes    Quit date: 07/22/1994    Years since quitting: 25.1  . Smokeless tobacco: Never Used  Vaping Use  . Vaping Use: Never used  Substance and Sexual Activity  . Alcohol use: No    Comment: 1 glass of wine on holidays.  . Drug use: No  . Sexual activity: Never  Other Topics Concern  . Not on file  Social History Narrative   Caffeine - 2 cups of coffee   Exercise - none regularly   Diet - tries to eat as healthy as possible but can do better      Does not have a Living Will         Social Determinants of Health   Financial Resource Strain: Low Risk   . Difficulty of Paying Living Expenses: Not hard at all  Food Insecurity: No Food Insecurity  .  Worried About Charity fundraiser in the Last Year: Never true  . Ran Out of Food in the Last Year: Never true  Transportation Needs: No Transportation Needs  . Lack of Transportation (Medical): No  . Lack of Transportation (Non-Medical): No  Physical Activity:   . Days of Exercise per Week: Not on file  . Minutes of Exercise per Session: Not on file  Stress: No Stress Concern Present  . Feeling of Stress : Not at all  Social Connections: Unknown  . Frequency of Communication with Friends and Family: More than three times a week  . Frequency of Social Gatherings with Friends and Family: Not on file  . Attends Religious Services: Not on file  . Active Member of Clubs or Organizations: Not on file  . Attends Archivist Meetings: Not on file  . Marital Status: Not on file  Intimate Partner Violence: Not At Risk  . Fear of Current or Ex-Partner: No  . Emotionally Abused: No  . Physically Abused: No  . Sexually Abused: No    ALLERGIES:     Allergies  Allergen Reactions  . Fosamax [Alendronate]     Chest pain    CURRENT MEDICATIONS:    Current Outpatient Medications  Medication Sig Dispense Refill  . Apoaequorin 10 MG CAPS Take by mouth.    Marland Kitchen atorvastatin (LIPITOR) 40 MG tablet TAKE 1 TABLET BY MOUTH EVERY DAY 90 tablet 1  . citalopram (CELEXA) 10 MG tablet Take 1 tablet (10 mg total) by mouth daily. 90 tablet 2  . latanoprost (XALATAN) 0.005 % ophthalmic solution Place 1 drop into the right eye at bedtime.    . metoprolol succinate (TOPROL-XL) 25 MG 24 hr tablet Take 1 tablet (25 mg total) by mouth daily. 90 tablet 1  . Multiple Vitamins-Minerals (MULTIPLE VITAMINS/WOMENS) tablet Take 1 tablet by mouth daily.    Marland Kitchen omeprazole (PRILOSEC) 20 MG capsule Take 20 mg by mouth daily.    . ondansetron (ZOFRAN-ODT) 4 MG disintegrating tablet Take by mouth.    . potassium chloride (KLOR-CON) 10 MEQ tablet TAKE 1 TABLET BY MOUTH EVERY OTHER DAY. 60 tablet 1  . triamterene-hydrochlorothiazide (MAXZIDE-25) 37.5-25 MG tablet TAKE 1 TABLET BY MOUTH EVERY DAY 90 tablet 1   No current facility-administered medications for this visit.    REVIEW OF SYSTEMS:   [X]  denotes positive finding, [ ]  denotes negative finding Cardiac  Comments:  Chest pain or chest pressure:    Shortness of breath upon exertion:    Short of breath when lying flat:    Irregular heart rhythm:        Vascular    Pain in calf, thigh, or hip brought on by ambulation:    Pain in feet at night that wakes you up from your sleep:     Blood clot in your veins:    Leg swelling:         Pulmonary    Oxygen at home:    Productive cough:     Wheezing:         Neurologic    Sudden weakness in arms or legs:     Sudden numbness in arms or legs:     Sudden onset of difficulty speaking or slurred speech:    Temporary loss of vision in one eye:     Problems with dizziness:         Gastrointestinal    Blood in stool:      Vomited blood:  Genitourinary    Burning when urinating:     Blood in urine:        Psychiatric    Major depression:         Hematologic    Bleeding problems:    Problems with blood clotting too easily:        Skin    Rashes or ulcers:        Constitutional    Fever or chills:     PHYSICAL EXAM:   Vitals:   09/10/19 1425 09/10/19 1427  BP: 103/66 105/64  Pulse: 96   Resp: 20   Temp: 97.7 F (36.5 C)   TempSrc: Temporal   SpO2: (!) 45%   Weight: 136 lb 8 oz (61.9 kg)   Height: 5' (1.524 m)     GENERAL: The patient is a well-nourished female, in no acute distress. The vital signs are documented above. CARDIAC: There is a regular rate and rhythm.  PULMONARY: Nonlabored respirations MUSCULOSKELETAL: There are no major deformities or cyanosis. NEUROLOGIC: No focal weakness or paresthesias are detected. SKIN: There are no ulcers or rashes noted. PSYCHIATRIC: The patient has a normal affect.  STUDIES:   I have reviewed the following studies: Right Carotid: Velocities in the right ICA are consistent with a 40-59%         stenosis.   Left Carotid: Velocities in the left ICA are consistent with a 1-39%  stenosis.   Vertebrals: Bilateral vertebral arteries demonstrate antegrade flow.  Subclavians: Normal flow hemodynamics were seen in bilateral subclavian        arteries.   ASSESSMENT and PLAN   Asymptomatic right carotid stenosis: We discussed that as long as she remains asymptomatic, intervention will be recommended once the stenosis becomes greater than 80%.  In the meantime she should continue her current medical therapy consisting of blood pressure control and a statin.  She does not take aspirin because she states she bleeds very easily.  She is scheduled for follow-up in 1 year with repeat carotid duplex.   Leia Alf, MD, FACS [facility] [phone] Pager [phone]

## 2019-09-10 NOTE — Assessment & Plan Note (Addendum)
No back pain. We discussed degenerative changes seen, herniation of disc of thoracic spine. No h/o cancer. She declines further evaluation of thoracic spine including dedicated image, orthopedic referral.  H/o CKD. Pending vitamin d. Normal calcium. Discussed the risk  of Prolia including the risk of vertebral, hip nonvertebral fractures.  We also discussed the risk of potential skin events including rash, eczema as well as recurrence of infection, some serious. Other potential risks were conveyed including risk of osteonecrosis of the jaw.  Pending dental clearance in which patient will call the office after speaking with dentist. After which we will seek to approve prolia through insurance.  Education provided on AVS as well. All questions answered.

## 2019-09-10 NOTE — Assessment & Plan Note (Signed)
Advised to stay on statin therapy.

## 2019-09-10 NOTE — Patient Instructions (Addendum)
Please call dr Candiss Norse and make him aware that you have resumed prior dose of maxxide.   Let me know if you would like to order a dedicated image for liver lesion which I advised today.  Speak with your dentist about starting  prolia and ask for clearance. Call the office after you do this so we can get prolia approved through insurance.   We discussed the risk Prolia including the risk of vertebral, hip nonvertebral fractures.  Prolia also has the risk of potential skin events including rash, eczema as well as recurrence of infection, some serious. Other potential risks  include risk of osteonecrosis of the jaw. Please let me know of ANY planned dental procedure for this reason. If you decide to come off this medication, there is risk of rapid bone loss and fracture, therefore prior to discontinuing or missing a dose, please let me know so we can make a plan for another agent.  We want you to optimize vitamin d and calcium. Please follow the below doses.   For post menopausal women, guidelines recommend a diet with 1200 mg of Calcium per day. If you are eating calcium rich foods, you do not need a calcium supplement. The body better absorbs the calcium that you eat over supplementation. If you do supplement, I recommend not supplementing the full 1200 mg/ day as this can lead to increased risk of cardiovascular disease. I recommend Calcium Citrate over the counter, and you may take a total of 600 to 800 mg per day in divided doses with meals for best absorption.   For bone health, you need adequate vitamin D, and I recommend you supplement as it is harder to do so with diet alone. I recommend cholecalciferol 800 units daily.  Also, please ensure you are following a diet high in calcium -- research shows better outcomes with dietary sources including kale, yogurt, broccolii, cheese, okra, almonds- to name a few.    Also remember that exercise is a great medicine for maintain and preserve bone health.  Advise moderate exercise for 30 minutes , 3 times per week.     Denosumab injection What is this medicine? DENOSUMAB (den oh sue mab) slows bone breakdown. Prolia is used to treat osteoporosis in women after menopause and in men, and in people who are taking corticosteroids for 6 months or more. Delton See is used to treat a high calcium level due to cancer and to prevent bone fractures and other bone problems caused by multiple myeloma or cancer bone metastases. Delton See is also used to treat giant cell tumor of the bone. This medicine may be used for other purposes; ask your health care provider or pharmacist if you have questions. COMMON BRAND NAME(S): Prolia, XGEVA What should I tell my health care provider before I take this medicine? They need to know if you have any of these conditions:  dental disease  having surgery or tooth extraction  infection  kidney disease  low levels of calcium or Vitamin D in the blood  malnutrition  on hemodialysis  skin conditions or sensitivity  thyroid or parathyroid disease  an unusual reaction to denosumab, other medicines, foods, dyes, or preservatives  pregnant or trying to get pregnant  breast-feeding How should I use this medicine? This medicine is for injection under the skin. It is given by a health care professional in a hospital or clinic setting. A special MedGuide will be given to you before each treatment. Be sure to read this information carefully  each time. For Prolia, talk to your pediatrician regarding the use of this medicine in children. Special care may be needed. For Delton See, talk to your pediatrician regarding the use of this medicine in children. While this drug may be prescribed for children as young as 13 years for selected conditions, precautions do apply. Overdosage: If you think you have taken too much of this medicine contact a poison control center or emergency room at once. NOTE: This medicine is only for you. Do not  share this medicine with others. What if I miss a dose? It is important not to miss your dose. Call your doctor or health care professional if you are unable to keep an appointment. What may interact with this medicine? Do not take this medicine with any of the following medications:  other medicines containing denosumab This medicine may also interact with the following medications:  medicines that lower your chance of fighting infection  steroid medicines like prednisone or cortisone This list may not describe all possible interactions. Give your health care provider a list of all the medicines, herbs, non-prescription drugs, or dietary supplements you use. Also tell them if you smoke, drink alcohol, or use illegal drugs. Some items may interact with your medicine. What should I watch for while using this medicine? Visit your doctor or health care professional for regular checks on your progress. Your doctor or health care professional may order blood tests and other tests to see how you are doing. Call your doctor or health care professional for advice if you get a fever, chills or sore throat, or other symptoms of a cold or flu. Do not treat yourself. This drug may decrease your body's ability to fight infection. Try to avoid being around people who are sick. You should make sure you get enough calcium and vitamin D while you are taking this medicine, unless your doctor tells you not to. Discuss the foods you eat and the vitamins you take with your health care professional. See your dentist regularly. Brush and floss your teeth as directed. Before you have any dental work done, tell your dentist you are receiving this medicine. Do not become pregnant while taking this medicine or for 5 months after stopping it. Talk with your doctor or health care professional about your birth control options while taking this medicine. Women should inform their doctor if they wish to become pregnant or think  they might be pregnant. There is a potential for serious side effects to an unborn child. Talk to your health care professional or pharmacist for more information. What side effects may I notice from receiving this medicine? Side effects that you should report to your doctor or health care professional as soon as possible:  allergic reactions like skin rash, itching or hives, swelling of the face, lips, or tongue  bone pain  breathing problems  dizziness  jaw pain, especially after dental work  redness, blistering, peeling of the skin  signs and symptoms of infection like fever or chills; cough; sore throat; pain or trouble passing urine  signs of low calcium like fast heartbeat, muscle cramps or muscle pain; pain, tingling, numbness in the hands or feet; seizures  unusual bleeding or bruising  unusually weak or tired Side effects that usually do not require medical attention (report to your doctor or health care professional if they continue or are bothersome):  constipation  diarrhea  headache  joint pain  loss of appetite  muscle pain  runny nose  tiredness  upset stomach This list may not describe all possible side effects. Call your doctor for medical advice about side effects. You may report side effects to FDA at 1-800-FDA-1088. Where should I keep my medicine? This medicine is only given in a clinic, doctor's office, or other health care setting and will not be stored at home. NOTE: This sheet is a summary. It may not cover all possible information. If you have questions about this medicine, talk to your doctor, pharmacist, or health care provider.  2020 Elsevier/Gold Standard (2017-05-13 16:10:44)

## 2019-09-10 NOTE — Assessment & Plan Note (Signed)
Controlled. HA resolved. Patient is not taking full dose of maxide. We discussed potentially moving from maxide to amlodipine. She prefers to discuss with Dr Candiss Norse and will call his office in this regard.

## 2019-09-10 NOTE — Assessment & Plan Note (Signed)
Patient completed HCPOA and desire for natural death paperwork. She would like CPR.  This will be scanned chart.

## 2019-09-10 NOTE — Progress Notes (Signed)
Subjective:    Patient ID: Christie Stevenson, female    DOB: 1940/10/02, 79 y.o.   MRN: [id number]  CC: Christie Stevenson is a 79 y.o. female who presents today for follow up.   HPI: Discuss CT chest and dexa  Feels well today. No complaints .   Has brought HCPOA and desire for natural death.  Does not desire heroic acts if terminally ill. She would like CPR.   HTN- had been on half dose of maxide per dr Candiss Norse, however started to have HA's so has resumed full tablet. HA resolved.  No h/o leg swelling. No cp, sob.  Take Kcl  QOD.    No h/o cancer. No back pain.   Osteoporosis seen on DEXA. Had been on fosamax.  No upcoming dental procedures.   States liver lesion has been there for 10-15 years.  No abdominal pain, jaundice.    CKD, proteinuria- following with Dr Candiss Norse, decreased diuretic to half dose. PVCs- last seen 2019 Christie Stevenson.  Carotid artery stenosis - follow up today with vascular.   HISTORY:  Past Medical History:  Diagnosis Date  . 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ease   . Chronic kidney disease    STAGE 3  . Depression   . GERD (gastroesophageal reflux disease)   . History of colon polyps   . History of diverticulitis   . History of kidney stones   . Hyperlipidemia   . Hypertension   . Migraine   . Osteoporosis    Past Surgical History:  Procedure Laterality Date  . CATARACT EXTRACTION Left 2014  . CATARACT EXTRACTION W/PHACO Right 04/01/2015   Procedure: CATARACT EXTRACTION PHACO AND INTRAOCULAR LENS PLACEMENT (IOC);  Surgeon: [name], MD;  Location: [facility] ORS;  Service: Ophthalmology;  Laterality: Right;  [place] 00:47 AP% 20.8 CDE 9.76 fluid pack lot # [id number]  . COLONOSCOPY    . COLONOSCOPY WITH PROPOFOL N/A 05/26/2015   Procedure: COLONOSCOPY WITH PROPOFOL;  Surgeon: [name], MD;  Location: [facility] ENDOSCOPY;  Service: Endoscopy;  Laterality: N/A;  . TUBAL LIGATION     Family History  Problem Relation Age of Onset  .  Arthritis Mother   . Hypertension Mother   . Osteoporosis Mother   . Osteoporosis Sister   . Osteoporosis Brother   . Heart disease Brother 77       CABG and stents  . Diabetes Daughter   . Diabetes Son   . Cancer Paternal Uncle        Prostate Cancer  . Cancer Sister        Breast Cancer   . Osteoporosis Sister   . Breast cancer Sister        mid 71's  . Osteoporosis Brother   . Heart attack Brother 72  . Arrhythmia Brother        s/p pacermaker  . Diabetes Daughter     Allergies: Fosamax [alendronate] Current Outpatient Medications on File Prior to Visit  Medication Sig Dispense Refill  . Apoaequorin 10 MG CAPS Take by mouth.    Marland Kitchen atorvastatin (LIPITOR) 40 MG tablet TAKE 1 TABLET BY MOUTH EVERY DAY 90 tablet 1  . citalopram (CELEXA) 10 MG tablet Take 1 tablet (10 mg total) by mouth daily. 90 tablet 2  . latanoprost (XALATAN) 0.005 % ophthalmic solution Place 1 drop into the right eye at bedtime.    . metoprolol succinate (TOPROL-XL) 25 MG 24 hr tablet Take 1 tablet (25 mg total) by  mouth daily. 90 tablet 1  . Multiple Vitamins-Minerals (MULTIPLE VITAMINS/WOMENS) tablet Take 1 tablet by mouth daily.    Marland Kitchen omeprazole (PRILOSEC) 20 MG capsule Take 20 mg by mouth daily.    . ondansetron (ZOFRAN-ODT) 4 MG disintegrating tablet Take by mouth.    . potassium chloride (KLOR-CON) 10 MEQ tablet TAKE 1 TABLET BY MOUTH EVERY OTHER DAY. 60 tablet 1  . triamterene-hydrochlorothiazide (MAXZIDE-25) 37.5-25 MG tablet TAKE 1 TABLET BY MOUTH EVERY DAY 90 tablet 1   No current facility-administered medications on file prior to visit.    Social History   Tobacco Use  . Smoking status: Former Smoker    Packs/day: 1.00    Years: 40.00    Pack years: 40.00    Types: Cigarettes    Quit date: 07/22/1994    Years since quitting: 25.1  . Smokeless tobacco: Never Used  Vaping Use  . Vaping Use: Never used  Substance Use Topics  . Alcohol use: No    Comment: 1 glass of wine on holidays.  .  Drug use: No    Review of Systems  Constitutional: Negative for chills and fever.  Respiratory: Negative for cough.   Cardiovascular: Negative for chest pain and palpitations.  Gastrointestinal: Negative for abdominal pain, nausea and vomiting.  Musculoskeletal: Negative for back pain.  Neurological: Negative for weakness and numbness.      Objective:    BP 122/70 (BP Location: Left Arm, Patient Position: Sitting, Cuff Size: Normal)   Pulse (!) 55   Temp 98 F (36.7 C)   Ht 5' (1.524 m)   Wt 137 lb 3.2 oz (62.2 kg)   SpO2 98%   BMI 26.80 kg/m  BP Readings from Last 3 Encounters:  09/10/19 122/70  07/10/19 124/72  06/25/19 122/72   Wt Readings from Last 3 Encounters:  09/10/19 137 lb 3.2 oz (62.2 kg)  09/05/19 139 lb (63 kg)  07/10/19 139 lb (63 kg)    Physical Exam Vitals reviewed.  Constitutional:      Appearance: She is well-developed.  Eyes:     Conjunctiva/sclera: Conjunctivae normal.  Cardiovascular:     Rate and Rhythm: Normal rate and regular rhythm.     Pulses: Normal pulses.     Heart sounds: Normal heart sounds.  Pulmonary:     Effort: Pulmonary effort is normal.     Breath sounds: Normal breath sounds. No wheezing, rhonchi or rales.  Skin:    General: Skin is warm and dry.  Neurological:     Mental Status: She is alert.  Psychiatric:        Speech: Speech normal.        Behavior: Behavior normal.        Thought Content: Thought content normal.        Assessment & Plan:   Problem List Items Addressed This Visit      Cardiovascular and Mediastinum   Atherosclerosis of aorta ([employer])    Advised to stay on statin therapy.      Essential hypertension    Controlled. HA resolved. Patient is not taking full dose of maxide. We discussed potentially moving from maxide to amlodipine. She prefers to discuss with Dr Candiss Norse and will call his office in this regard.         Musculoskeletal and Integument   Osteoporosis - Primary    No back pain. We  discussed degenerative changes seen, herniation of disc of thoracic spine. No h/o cancer. She declines further evaluation of thoracic  spine including dedicated image, orthopedic referral.  H/o CKD. Pending vitamin d. Normal calcium. Discussed the risk  of Prolia including the risk of vertebral, hip nonvertebral fractures.  We also discussed the risk of potential skin events including rash, eczema as well as recurrence of infection, some serious. Other potential risks were conveyed including risk of osteonecrosis of the jaw.  Pending dental clearance in which patient will call the office after speaking with dentist. After which we will seek to approve prolia through insurance.  Education provided on AVS as well. All questions answered.       Relevant Orders   VITAMIN D 25 Hydroxy (Vit-D Deficiency, Fractures)     Genitourinary   Chronic kidney disease     Other   End of life care    Patient completed HCPOA and desire for natural death paperwork. She would like CPR.  This will be scanned chart.       Liver lesion    Seen ct chest 2021; states has been there 10+ years. I advised dedicated image for further characterization and to ensure benign;  she politely declines any further work up          I have discontinued Darylene Price. [place]'s Calcium Carb-Cholecalciferol (CALCIUM 600 + D PO) and Calcium Carbonate-Vitamin D. I am also having her maintain her latanoprost, omeprazole, triamterene-hydrochlorothiazide, Multiple Vitamins/Womens, citalopram, metoprolol succinate, atorvastatin, ondansetron, potassium chloride, and Apoaequorin.   No orders of the defined types were placed in this encounter.  I have spent 42  minutes with a patient including precharting, exam, reviewing medical records, and discussion plan of care.      Return precautions given.   Risks, benefits, and alternatives of the medications and treatment plan prescribed today were discussed, and patient expressed  understanding.   Education regarding symptom management and diagnosis given to patient on AVS.  Continue to follow with Burnard Hawthorne, FNP for routine health maintenance.   Christie Stevenson and I agreed with plan.   [name], FNP

## 2019-10-10 ENCOUNTER — Other Ambulatory Visit: Payer: Self-pay | Admitting: Family

## 2019-10-10 DIAGNOSIS — E876 Hypokalemia: Secondary | ICD-10-CM

## 2019-10-10 DIAGNOSIS — I10 Essential (primary) hypertension: Secondary | ICD-10-CM

## 2019-10-17 ENCOUNTER — Other Ambulatory Visit: Payer: Self-pay

## 2019-10-17 ENCOUNTER — Encounter: Payer: Self-pay | Admitting: Internal Medicine

## 2019-10-17 ENCOUNTER — Ambulatory Visit (INDEPENDENT_AMBULATORY_CARE_PROVIDER_SITE_OTHER): Payer: Medicare Other | Admitting: Internal Medicine

## 2019-10-17 VITALS — BP 148/66 | HR 53 | Temp 97.3°F | Ht 60.0 in | Wt 138.8 lb

## 2019-10-17 DIAGNOSIS — Z23 Encounter for immunization: Secondary | ICD-10-CM

## 2019-10-17 DIAGNOSIS — G4733 Obstructive sleep apnea (adult) (pediatric): Secondary | ICD-10-CM

## 2019-10-17 NOTE — Patient Instructions (Signed)
Continue CPAP as prescribed. 

## 2019-10-17 NOTE — Progress Notes (Signed)
Name: Christie Stevenson MRN: [id number] DOB: 10-05-1940     CONSULTATION DATE: 8.15.19 REFERRING MD : arnette    STUDIES:  No current CT scans or CXR's HST--> 2019 SEVERE OS AHI 30  Chief complaint Follow-up sleep apnea  HPI Patient carries a diagnosis of severe sleep apnea since 2019 with AHI of 30 Patient doing well with therapy     PAST MEDICAL HISTORY :   has a past medical history of 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ease, Chronic kidney disease, Depression, GERD (gastroesophageal reflux disease), History of colon polyps, History of diverticulitis, History of kidney stones, Hyperlipidemia, Hypertension, Migraine, and Osteoporosis.  has a past surgical history that includes Cataract extraction (Left, 2014); Tubal ligation; Colonoscopy; Cataract extraction w/PHACO (Right, 04/01/2015); and Colonoscopy with propofol (N/A, 05/26/2015). Prior to Admission medications   Medication Sig Start Date End Date Taking? Authorizing Provider  alendronate (FOSAMAX) 70 MG tablet Take 1 tablet (70 mg total) by mouth every 7 (seven) days. Take with a full glass of water on an empty stomach. 08/19/17   Burnard Hawthorne, FNP  atorvastatin (LIPITOR) 40 MG tablet TAKE 1 TABLET (20 MG TOTAL) BY MOUTH DAILY. 04/11/17   Burnard Hawthorne, FNP  Calcium Carb-Cholecalciferol (CALCIUM 600 + D PO) Take 600 mg by mouth 2 (two) times daily.    [provider]  latanoprost (XALATAN) 0.005 % ophthalmic solution Place 1 drop into the right eye at bedtime.    [provider]  metoprolol succinate (TOPROL-XL) 25 MG 24 hr tablet TAKE 1 TABLET BY MOUTH EVERY DAY 06/14/17   Burnard Hawthorne, FNP  omeprazole (PRILOSEC) 20 MG capsule Take 20 mg by mouth daily.    [provider]  triamterene-hydrochlorothiazide (MAXZIDE-25) 37.5-25 MG tablet Take 1 tablet by mouth daily. 03/16/17   Burnard Hawthorne, FNP  triamterene-hydrochlorothiazide (MAXZIDE-25) 37.5-25 MG tablet TAKE 1 TABLET BY  MOUTH EVERY DAY 06/14/17   Burnard Hawthorne, FNP   Allergies  Allergen Reactions  . Fosamax [Alendronate]     Chest pain    FAMILY HISTORY:  family history includes Arrhythmia in her brother; Arthritis in her mother; Breast cancer in her sister; Cancer in her paternal uncle and sister; Diabetes in her daughter, daughter, and son; Heart attack (age of onset: 34) in her brother; Heart disease (age of onset: 20) in her brother; Hypertension in her mother; Osteoporosis in her brother, brother, mother, sister, and sister. SOCIAL HISTORY:  reports that she quit smoking about 25 years ago. Her smoking use included cigarettes. She has a 40.00 pack-year smoking history. She has never used smokeless tobacco. She reports that she does not drink alcohol and does not use drugs.   Review of Systems:  Gen:  Denies  fever, sweats, chills weight loss  HEENT: Denies blurred vision, double vision, ear pain, eye pain, hearing loss, nose bleeds, sore throat Cardiac:  No dizziness, chest pain or heaviness, chest tightness,edema, No JVD Resp:   No cough, -sputum production, -shortness of breath,-wheezing, -hemoptysis,  Gi: Denies swallowing difficulty, stomach pain, nausea or vomiting, diarrhea, constipation, bowel incontinence Gu:  Denies bladder incontinence, burning urine Ext:   Denies Joint pain, stiffness or swelling Skin: Denies  skin rash, easy bruising or bleeding or hives Endoc:  Denies polyuria, polydipsia , polyphagia or weight change Psych:   Denies depression, insomnia or hallucinations  Other:  All other systems negative  BP (!) 148/66 (BP Location: Left Arm, Patient Position: Sitting, Cuff Size: Normal)   Pulse Marland Kitchen)  53   Temp (!) 97.3 F (36.3 C) (Temporal)   Ht 5' (1.524 m)   Wt 138 lb 12.8 oz (63 kg)   SpO2 97%   BMI 27.11 kg/m    Physical Examination:   General Appearance: No distress  Neuro:without focal findings,  speech normal,  HEENT: PERRLA, EOM intact.   Pulmonary: normal  breath sounds, No wheezing.  CardiovascularNormal S1,S2.  No m/r/g.   Abdomen: Benign, Soft, non-tender. Renal:  No costovertebral tenderness  GU:  Not performed at this time. Endoc: No evident thyromegaly Skin:   warm, no rashes, no ecchymosis  Extremities: normal, no cyanosis, clubbing. PSYCHIATRIC: Mood, affect within normal limits.   ALL OTHER ROS ARE NEGATIVE   ASSESSMENT / PLAN:  SEVERE OSA AHI 30   COVID-19 EDUCATION: The signs and symptoms of COVID-19 were discussed with the patient and how to seek care for testing.  The importance of social distancing was discussed today. Hand Washing Techniques and avoid touching face was advised.     MEDICATION ADJUSTMENTS/LABS AND TESTS ORDERED: Continue CPAP as prescribed FLU VACCINE TODAY    CURRENT MEDICATIONS REVIEWED AT LENGTH WITH PATIENT TODAY   Patient satisfied with Plan of action and management. All questions answered  Follow up in 1 year  TOTAL TIME SPENT 15 mins  Idalis Hoelting Patricia Pesa, M.D.  [facility]  Medical Director [place] Director [facility] Cardio-Pulmonary Department

## 2019-10-18 ENCOUNTER — Other Ambulatory Visit: Payer: Self-pay | Admitting: Family

## 2019-10-18 DIAGNOSIS — I10 Essential (primary) hypertension: Secondary | ICD-10-CM

## 2019-10-29 ENCOUNTER — Telehealth: Payer: Self-pay | Admitting: Family

## 2019-10-29 NOTE — Telephone Encounter (Signed)
LMTCB

## 2019-10-29 NOTE — Telephone Encounter (Signed)
-----   Message from Ashley Jacobs sent at 10/24/2019  1:53 PM EDT ----- Regarding: Diagnostic mammo and [place] Good afternoon!  I called pt to sch her 6 month follow up with the diagnostic mammo and [place]. Pt was speaking passionate about she does not see the sense in getting the diagnostic mammo. And that its a rip off for Medicare ins. And its a size of a fist and at first they could not find it and so on. I advised pt that its a precautionary measure so that it does not develop into nothing. Pt stated she would like to wait until her yearly. Please advise and Thank you!

## 2019-10-29 NOTE — Telephone Encounter (Signed)
Call pt as it related to follow up for left breast suspected microcyst.   Does she want to schedule left diagnostic mammogram and left [place]?   Please let me and rasheedah know she can schedule

## 2019-10-30 NOTE — Telephone Encounter (Signed)
LMTCB

## 2019-10-31 ENCOUNTER — Telehealth: Payer: Self-pay | Admitting: Family

## 2019-10-31 NOTE — Telephone Encounter (Signed)
lft vm for pt to call ofc to verify US breast appt

## 2019-10-31 NOTE — Telephone Encounter (Signed)
Letter has been mailed to patient

## 2019-10-31 NOTE — Progress Notes (Signed)
Subjective:    Patient ID: Christie Stevenson, female    DOB: 12/04/1940, 79 y.o.   MRN: [id number]  CC: Christie Stevenson is a 79 y.o. female who presents today for follow up.   HPI: Feels well today No complaints  HTN- Compliant with potassium chloride 10 meq QOD  and triamterene HCTZ , compliant with metoprolol 25mg . No cp, sob.  Osteoporosis- No dental procedures planned. She had had chest pain on fosamax.  CrtCl 36   H/o hypokalemia   H/o ckd - follows with dr Candiss Norse  follow up for left breast suspected microcyst. No breast pain and unable to appreciate breast mass. Sister had breast cancer.  Declines screening until annual due  04/2020. due for left diagnostic mammogram and left [place]   HISTORY:  Past Medical History:  Diagnosis Date  . 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ease   . Chronic kidney disease    STAGE 3  . Depression   . GERD (gastroesophageal reflux disease)   . History of colon polyps   . History of diverticulitis   . History of kidney stones   . Hyperlipidemia   . Hypertension   . Migraine   . Osteoporosis    Past Surgical History:  Procedure Laterality Date  . CATARACT EXTRACTION Left 2014  . CATARACT EXTRACTION W/PHACO Right 04/01/2015   Procedure: CATARACT EXTRACTION PHACO AND INTRAOCULAR LENS PLACEMENT (IOC);  Surgeon: [name], MD;  Location: [facility] ORS;  Service: Ophthalmology;  Laterality: Right;  [place] 00:47 AP% 20.8 CDE 9.76 fluid pack lot # [id number]  . COLONOSCOPY    . COLONOSCOPY WITH PROPOFOL N/A 05/26/2015   Procedure: COLONOSCOPY WITH PROPOFOL;  Surgeon: [name], MD;  [facility] ENDOSCOPY;  Service: Endoscopy;  Laterality: N/A;  . TUBAL LIGATION     Family History  Problem Relation Age of Onset  . Arthritis Mother   . Hypertension Mother   . Osteoporosis Mother   . Osteoporosis Sister   . Osteoporosis Brother   . Heart disease Brother 36       CABG and stents  . Diabetes Daughter   . Diabetes Son   .  Cancer Paternal Uncle        Prostate Cancer  . Cancer Sister        Breast Cancer   . Osteoporosis Sister   . Breast cancer Sister        mid 78's  . Osteoporosis Brother   . Heart attack Brother 50  . Arrhythmia Brother        s/p pacermaker  . Diabetes Daughter     Allergies: Fosamax [alendronate] Current Outpatient Medications on File Prior to Visit  Medication Sig Dispense Refill  . Apoaequorin 10 MG CAPS Take by mouth.     Marland Kitchen atorvastatin (LIPITOR) 40 MG tablet TAKE 1 TABLET BY MOUTH EVERY DAY 90 tablet 1  . citalopram (CELEXA) 10 MG tablet Take 1 tablet (10 mg total) by mouth daily. 90 tablet 2  . latanoprost (XALATAN) 0.005 % ophthalmic solution Place 1 drop into the right eye at bedtime.    . metoprolol succinate (TOPROL-XL) 25 MG 24 hr tablet Take 1 tablet (25 mg total) by mouth daily. 90 tablet 1  . Multiple Vitamins-Minerals (MULTIPLE VITAMINS/WOMENS) tablet Take 1 tablet by mouth daily.    Marland Kitchen omeprazole (PRILOSEC) 20 MG capsule Take 20 mg by mouth daily.    . potassium chloride (KLOR-CON) 10 MEQ tablet TAKE 1 TABLET BY MOUTH EVERY OTHER  DAY. 60 tablet 2  . triamterene-hydrochlorothiazide (MAXZIDE-25) 37.5-25 MG tablet TAKE 1 TABLET BY MOUTH EVERY DAY 90 tablet 1   No current facility-administered medications on file prior to visit.    Social History   Tobacco Use  . Smoking status: Former Smoker    Packs/day: 1.00    Years: 40.00    Pack years: 40.00    Types: Cigarettes    Quit date: 07/22/1994    Years since quitting: 25.3  . Smokeless tobacco: Never Used  Vaping Use  . Vaping Use: Never used  Substance Use Topics  . Alcohol use: No    Comment: 1 glass of wine on holidays.  . Drug use: No    Review of Systems  Constitutional: Negative for chills and fever.  Respiratory: Negative for cough.   Cardiovascular: Negative for chest pain and palpitations.  Gastrointestinal: Negative for nausea and vomiting.      Objective:    BP 118/70   Pulse (!) 58    Temp 98.1 F (36.7 C)   Ht 5' (1.524 m)   Wt 142 lb 9.6 oz (64.7 kg)   SpO2 98%   BMI 27.85 kg/m  BP Readings from Last 3 Encounters:  11/12/19 118/70  10/17/19 (!) 148/66  09/10/19 105/64   Wt Readings from Last 3 Encounters:  11/12/19 142 lb 9.6 oz (64.7 kg)  10/17/19 138 lb 12.8 oz (63 kg)  09/10/19 136 lb 8 oz (61.9 kg)    Physical Exam Vitals reviewed.  Constitutional:      Appearance: She is well-developed.  Eyes:     Conjunctiva/sclera: Conjunctivae normal.  Cardiovascular:     Rate and Rhythm: Normal rate and regular rhythm.     Pulses: Normal pulses.     Heart sounds: Normal heart sounds.  Pulmonary:     Effort: Pulmonary effort is normal.     Breath sounds: Normal breath sounds. No wheezing, rhonchi or rales.  Skin:    General: Skin is warm and dry.  Neurological:     Mental Status: She is alert.  Psychiatric:        Speech: Speech normal.        Behavior: Behavior normal.        Thought Content: Thought content normal.        Assessment & Plan:   Problem List Items Addressed This Visit      Cardiovascular and Mediastinum   Essential hypertension - Primary    Controlled. Continue  potassium chloride 10 meq QOD  and triamterene HCTZ 37.5- 25 , compliant with metoprolol 25mg .       Relevant Orders   Basic metabolic panel     Musculoskeletal and Integument   Osteoporosis    Untreated at this time. CrtCl 36. Discussed prolia again today and in particular risks of atypical femur fracture, infection, hypocalcemia, and jaw necrosis.  Call out to Dr Candiss Norse, nephrology for his advice on starting medication.          Other   Benign cyst of left breast    Advised to proceed with 6 month screening of left breast. She declines and is adamant that she doesn't want diagnostic images until due for annual 04/2020.          I have discontinued Darylene Price. [place]'s ondansetron. I am also having her maintain her latanoprost, omeprazole, Multiple  Vitamins/Womens, citalopram, metoprolol succinate, atorvastatin, Apoaequorin, potassium chloride, and triamterene-hydrochlorothiazide.   No orders of the defined types were placed in this  encounter.   Return precautions given.   Risks, benefits, and alternatives of the medications and treatment plan prescribed today were discussed, and patient expressed understanding.   Education regarding symptom management and diagnosis given to patient on AVS.  Continue to follow with Burnard Hawthorne, FNP for routine health maintenance.   Christie Stevenson and I agreed with plan.   [name], FNP

## 2019-11-12 ENCOUNTER — Telehealth: Payer: Self-pay | Admitting: Family

## 2019-11-12 ENCOUNTER — Other Ambulatory Visit: Payer: Self-pay

## 2019-11-12 ENCOUNTER — Encounter: Payer: Self-pay | Admitting: Family

## 2019-11-12 ENCOUNTER — Ambulatory Visit (INDEPENDENT_AMBULATORY_CARE_PROVIDER_SITE_OTHER): Payer: Medicare Other | Admitting: Family

## 2019-11-12 VITALS — BP 118/70 | HR 58 | Temp 98.1°F | Ht 60.0 in | Wt 142.6 lb

## 2019-11-12 DIAGNOSIS — M810 Age-related osteoporosis without current pathological fracture: Secondary | ICD-10-CM

## 2019-11-12 DIAGNOSIS — I10 Essential (primary) hypertension: Secondary | ICD-10-CM | POA: Diagnosis not present

## 2019-11-12 DIAGNOSIS — N6002 Solitary cyst of left breast: Secondary | ICD-10-CM | POA: Diagnosis not present

## 2019-11-12 LAB — BASIC METABOLIC PANEL
BUN: 22 mg/dL (ref 6–23)
CO2: 29 mEq/L (ref 19–32)
Calcium: 10.3 mg/dL (ref 8.4–10.5)
Chloride: 101 mEq/L (ref 96–112)
Creatinine, Ser: 1.19 mg/dL (ref 0.40–1.20)
GFR: 43.86 mL/min — ABNORMAL LOW (ref >60.00)
Glucose, Bld: 77 mg/dL (ref 70–99)
Potassium: 3.9 mEq/L (ref 3.5–5.1)
Sodium: 138 mEq/L (ref 135–145)

## 2019-11-12 NOTE — Assessment & Plan Note (Addendum)
Untreated at this time. CrtCl 36. Discussed prolia again today and in particular risks of atypical femur fracture, infection, hypocalcemia, and jaw necrosis.  Call out to Dr Candiss Norse, nephrology for his advice on starting medication.

## 2019-11-12 NOTE — Assessment & Plan Note (Signed)
Controlled. Continue  potassium chloride 10 meq QOD  and triamterene HCTZ 37.5- 25 , compliant with metoprolol 25mg .

## 2019-11-12 NOTE — Patient Instructions (Addendum)
Ask dentist during dental cleaning next month about upcoming dental procedures as we would like to start prolia.  Call insurance about prolia as well and let me know if you would like [place] to check on our end if covered. Please read the below regarding this medication.    Denosumab injection What is this medicine? DENOSUMAB (den oh sue mab) slows bone breakdown. Prolia is used to treat osteoporosis in women after menopause and in men, and in people who are taking corticosteroids for 6 months or more. Delton See is used to treat a high calcium level due to cancer and to prevent bone fractures and other bone problems caused by multiple myeloma or cancer bone metastases. Delton See is also used to treat giant cell tumor of the bone. This medicine may be used for other purposes; ask your health care provider or pharmacist if you have questions. COMMON BRAND NAME(S): Prolia, XGEVA What should I tell my health care provider before I take this medicine? They need to know if you have any of these conditions:  dental disease  having surgery or tooth extraction  infection  kidney disease  low levels of calcium or Vitamin D in the blood  malnutrition  on hemodialysis  skin conditions or sensitivity  thyroid or parathyroid disease  an unusual reaction to denosumab, other medicines, foods, dyes, or preservatives  pregnant or trying to get pregnant  breast-feeding How should I use this medicine? This medicine is for injection under the skin. It is given by a health care professional in a hospital or clinic setting. A special MedGuide will be given to you before each treatment. Be sure to read this information carefully each time. For Prolia, talk to your pediatrician regarding the use of this medicine in children. Special care may be needed. For Delton See, talk to your pediatrician regarding the use of this medicine in children. While this drug may be prescribed for children as young as 13 years for selected  conditions, precautions do apply. Overdosage: If you think you have taken too much of this medicine contact a poison control center or emergency room at once. NOTE: This medicine is only for you. Do not share this medicine with others. What if I miss a dose? It is important not to miss your dose. Call your doctor or health care professional if you are unable to keep an appointment. What may interact with this medicine? Do not take this medicine with any of the following medications:  other medicines containing denosumab This medicine may also interact with the following medications:  medicines that lower your chance of fighting infection  steroid medicines like prednisone or cortisone This list may not describe all possible interactions. Give your health care provider a list of all the medicines, herbs, non-prescription drugs, or dietary supplements you use. Also tell them if you smoke, drink alcohol, or use illegal drugs. Some items may interact with your medicine. What should I watch for while using this medicine? Visit your doctor or health care professional for regular checks on your progress. Your doctor or health care professional may order blood tests and other tests to see how you are doing. Call your doctor or health care professional for advice if you get a fever, chills or sore throat, or other symptoms of a cold or flu. Do not treat yourself. This drug may decrease your body's ability to fight infection. Try to avoid being around people who are sick. You should make sure you get enough calcium and vitamin D  while you are taking this medicine, unless your doctor tells you not to. Discuss the foods you eat and the vitamins you take with your health care professional. See your dentist regularly. Brush and floss your teeth as directed. Before you have any dental work done, tell your dentist you are receiving this medicine. Do not become pregnant while taking this medicine or for 5 months  after stopping it. Talk with your doctor or health care professional about your birth control options while taking this medicine. Women should inform their doctor if they wish to become pregnant or think they might be pregnant. There is a potential for serious side effects to an unborn child. Talk to your health care professional or pharmacist for more information. What side effects may I notice from receiving this medicine? Side effects that you should report to your doctor or health care professional as soon as possible:  allergic reactions like skin rash, itching or hives, swelling of the face, lips, or tongue  bone pain  breathing problems  dizziness  jaw pain, especially after dental work  redness, blistering, peeling of the skin  signs and symptoms of infection like fever or chills; cough; sore throat; pain or trouble passing urine  signs of low calcium like fast heartbeat, muscle cramps or muscle pain; pain, tingling, numbness in the hands or feet; seizures  unusual bleeding or bruising  unusually weak or tired Side effects that usually do not require medical attention (report to your doctor or health care professional if they continue or are bothersome):  constipation  diarrhea  headache  joint pain  loss of appetite  muscle pain  runny nose  tiredness  upset stomach This list may not describe all possible side effects. Call your doctor for medical advice about side effects. You may report side effects to FDA at 1-800-FDA-1088. Where should I keep my medicine? This medicine is only given in a clinic, doctor's office, or other health care setting and will not be stored at home. NOTE: This sheet is a summary. It may not cover all possible information. If you have questions about this medicine, talk to your doctor, pharmacist, or health care provider.  2020 Elsevier/Gold Standard (2017-05-13 16:10:44)

## 2019-11-12 NOTE — Assessment & Plan Note (Signed)
Advised to proceed with 6 month screening of left breast. She declines and is adamant that she doesn't want diagnostic images until due for annual 04/2020.

## 2019-11-13 NOTE — Progress Notes (Signed)
I spoke with [facility] Dr. Keturah Barre nurse & she will send message to get his advice if prolia is okay in setting of kidney disease.

## 2019-11-14 ENCOUNTER — Telehealth: Payer: Self-pay | Admitting: Family

## 2019-11-14 NOTE — Telephone Encounter (Signed)
close

## 2019-11-14 NOTE — Telephone Encounter (Signed)
-----   Message from Earlyne Iba, [place] sent at 11/14/2019  9:14 AM EDT -----   ----- Message ----- From: Burnard Hawthorne, FNP Sent: 11/12/2019   1:16 PM EDT To: Earlyne Iba, CMA  Please call Murlean Iba, MD and ask if  he is okay with starting Prolia for osteoporosis in setting of kidney disease. [address] [place], [place] [address] [phone] (Work)

## 2019-11-14 NOTE — Progress Notes (Signed)
[place] Dr. Keturah Barre nurse called back & said that it was fine for patient to have prolia.

## 2019-11-14 NOTE — Telephone Encounter (Signed)
LMTCB

## 2019-11-14 NOTE — Telephone Encounter (Signed)
Call pt Let her know that nephrology okay with starting prolia Once she gets dental clearance at her next cleaning and no planned dental procedures/  If she would like to move forward with prolia, please ask her to call us after dentist so I can ask Juliann Pulse to run through insurance to ensure covered

## 2019-11-22 NOTE — Telephone Encounter (Signed)
LMTCB & sent mychart message since it is active.

## 2019-11-28 ENCOUNTER — Other Ambulatory Visit: Payer: Self-pay | Admitting: Family

## 2019-12-05 ENCOUNTER — Other Ambulatory Visit: Payer: Self-pay | Admitting: Internal Medicine

## 2019-12-05 ENCOUNTER — Other Ambulatory Visit: Payer: Self-pay | Admitting: Family

## 2019-12-05 DIAGNOSIS — F329 Major depressive disorder, single episode, unspecified: Secondary | ICD-10-CM

## 2019-12-05 DIAGNOSIS — F32A Depression, unspecified: Secondary | ICD-10-CM

## 2019-12-10 DIAGNOSIS — H40053 Ocular hypertension, bilateral: Secondary | ICD-10-CM | POA: Diagnosis not present

## 2020-03-04 DIAGNOSIS — I10 Essential (primary) hypertension: Secondary | ICD-10-CM | POA: Diagnosis not present

## 2020-03-04 DIAGNOSIS — N1832 Chronic kidney disease, stage 3b: Secondary | ICD-10-CM | POA: Diagnosis not present

## 2020-03-04 DIAGNOSIS — R809 Proteinuria, unspecified: Secondary | ICD-10-CM | POA: Diagnosis not present

## 2020-04-23 ENCOUNTER — Other Ambulatory Visit: Payer: Self-pay

## 2020-04-23 ENCOUNTER — Ambulatory Visit
Admission: EM | Admit: 2020-04-23 | Discharge: 2020-04-23 | Disposition: A | Discharge status: Home / Self Care | Payer: Medicare Other | Attending: Family Medicine | Admitting: Family Medicine

## 2020-04-23 DIAGNOSIS — M546 Pain in thoracic spine: Secondary | ICD-10-CM

## 2020-04-23 MED ORDER — BACLOFEN 10 MG PO TABS: 5.0000 mg | ORAL_TABLET | Freq: Three times a day (TID) | ORAL | 0 refills | Status: DC | PRN

## 2020-04-23 MED ORDER — PREDNISONE 10 MG (21) PO TBPK: ORAL_TABLET | ORAL | 0 refills | Status: DC

## 2020-04-23 NOTE — ED Triage Notes (Signed)
Pt reports back pain x 5 days.  Initially started as a band across lower back then spread to entire back.  Has now settled as a bad across mid-back area.  No known injury but states she was moving and doing typical yard work a week before it started.  Has tried Aspirin, Tylenol.  Has not tried heat/ice.

## 2020-04-23 NOTE — ED Provider Notes (Signed)
[facility]    CSN: [id number] Arrival date & time: 04/23/20  1354      History   Chief Complaint Chief Complaint  Patient presents with  . Back Pain   HPI  80 year old female presents with back pain.  5-day history of back pain.  She has had low back pain as well as mid back pain.  No recent fall, trauma, injury.  She does note that she has been carrying heavy boxes of the last where recently.  She has taken Excedrin and used ice and heat without resolution.  Pain is 6/10 in severity.  No relieving factors.  Seems to be exacerbated by certain activities and movements.  No other complaints.  Past Medical History:  Diagnosis Date  . 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ease   . Chronic kidney disease    STAGE 3  . Depression   . GERD (gastroesophageal reflux disease)   . History of colon polyps   . History of diverticulitis   . History of kidney stones   . Hyperlipidemia   . Hypertension   . Migraine   . Osteoporosis     Patient Active Problem List   Diagnosis Date Noted  . Benign cyst of left breast 11/12/2019  . End of life care 09/10/2019  . Atherosclerosis of aorta ([place]) 09/10/2019  . Liver lesion 09/10/2019  . Former smoker 07/10/2019  . OSA (obstructive sleep apnea) 06/25/2019  . GAD (generalized anxiety disorder) 03/06/2019  . Palpitations 10/28/2017  . PVC's (premature ventricular contractions) 10/28/2017  . Neuropathy 09/28/2017  . Irregular heart beat 08/19/2017  . Chronic kidney disease 05/11/2016  . Osteoporosis 03/22/2016  . Gastroesophageal reflux disease 03/22/2016  . History of palpitations 03/22/2016  . Routine general medical examination at a health care facility 03/23/2015  . Special screening for malignant neoplasms, colon 03/23/2015  . Cataracts, bilateral 03/23/2015  . Hyperlipidemia LDL goal <70 12/04/2013  . Carotid stenosis 11/02/2013  . Essential hypertension 11/02/2013  . Other fatigue 11/02/2013  . Screening  for breast cancer 11/02/2013    Past Surgical History:  Procedure Laterality Date  . CATARACT EXTRACTION Left 2014  . CATARACT EXTRACTION W/PHACO Right 04/01/2015   Procedure: CATARACT EXTRACTION PHACO AND INTRAOCULAR LENS PLACEMENT (IOC);  Surgeon: [name], MD;  Location: [facility] ORS;  Service: Ophthalmology;  Laterality: Right;  [place] 00:47 AP% 20.8 CDE 9.76 fluid pack lot # [id number]  . COLONOSCOPY    . COLONOSCOPY WITH PROPOFOL N/A 05/26/2015   Procedure: COLONOSCOPY WITH PROPOFOL;  Surgeon: [name], MD;  Location: [facility] ENDOSCOPY;  Service: Endoscopy;  Laterality: N/A;  . TUBAL LIGATION      OB History   No obstetric history on file.      Home Medications    Prior to Admission medications   Medication Sig Start Date End Date Taking? Authorizing Provider  atorvastatin (LIPITOR) 40 MG tablet TAKE 1 TABLET BY MOUTH EVERY DAY 12/05/19  Yes Crecencio Mc, MD  baclofen (LIORESAL) 10 MG tablet Take 0.5-1 tablets (5-10 mg total) by mouth 3 (three) times daily as needed for muscle spasms. 04/23/20  Yes Sheniece Ruggles G, DO  citalopram (CELEXA) 10 MG tablet TAKE 1 TABLET BY MOUTH EVERY DAY 12/05/19  Yes Arnett, Yvetta Coder, FNP  latanoprost (XALATAN) 0.005 % ophthalmic solution Place 1 drop into the right eye at bedtime.   Yes [provider]  metoprolol succinate (TOPROL-XL) 25 MG 24 hr tablet TAKE 1 TABLET BY MOUTH EVERY DAY 11/28/19  Yes Burnard Hawthorne, FNP  Multiple Vitamins-Minerals (MULTIPLE VITAMINS/WOMENS) tablet Take 1 tablet by mouth daily.   Yes [provider]  omeprazole (PRILOSEC) 20 MG capsule Take 20 mg by mouth daily.   Yes [provider]  potassium chloride (KLOR-CON) 10 MEQ tablet TAKE 1 TABLET BY MOUTH EVERY OTHER DAY. 10/11/19  Yes Arnett, Yvetta Coder, FNP  predniSONE (STERAPRED UNI-PAK 21 TAB) 10 MG (21) TBPK tablet 6 tablets on day 1; decrease by 1 tablet daily until gone. 04/23/20  Yes Kaycen Whitworth G, DO  Apoaequorin 10 MG CAPS Take  by mouth.     [provider]  triamterene-hydrochlorothiazide (MAXZIDE-25) 37.5-25 MG tablet TAKE 1 TABLET BY MOUTH EVERY DAY Patient taking differently: 0.5 tablets. 10/18/19   Burnard Hawthorne, FNP    Family History Family History  Problem Relation Age of Onset  . Arthritis Mother   . Hypertension Mother   . Osteoporosis Mother   . Osteoporosis Sister   . Osteoporosis Brother   . Heart disease Brother 52       CABG and stents  . Diabetes Daughter   . Diabetes Son   . Cancer Paternal Uncle        Prostate Cancer  . Cancer Sister        Breast Cancer   . Osteoporosis Sister   . Breast cancer Sister        mid 65's  . Osteoporosis Brother   . Heart attack Brother 7  . Arrhythmia Brother        s/p pacermaker  . Diabetes Daughter     Social History Social History   Tobacco Use  . Smoking status: Former Smoker    Packs/day: 1.00    Years: 40.00    Pack years: 40.00    Types: Cigarettes    Quit date: 07/22/1994    Years since quitting: 25.7  . Smokeless tobacco: Never Used  Vaping Use  . Vaping Use: Never used  Substance Use Topics  . Alcohol use: No    Comment: 1 glass of wine on holidays.  . Drug use: No     Allergies   Fosamax [alendronate]   Review of Systems Review of Systems  Constitutional: Negative.   Musculoskeletal: Positive for back pain.   Physical Exam Triage Vital Signs ED Triage Vitals  Enc Vitals Group     BP 04/23/20 1416 122/82     Pulse Rate 04/23/20 1416 67     Resp 04/23/20 1416 18     Temp 04/23/20 1416 98 F (36.7 C)     Temp Source 04/23/20 1416 Oral     SpO2 04/23/20 1416 98 %     Weight --      Height --      Head Circumference --      Peak Flow --      Pain Score 04/23/20 1417 6     Pain Loc --      Pain Edu? --      Excl. in [place]? --    Updated Vital Signs BP 122/82 (BP Location: Left Arm)   Pulse 67   Temp 98 F (36.7 C) (Oral)   Resp 18   SpO2 98%   Visual Acuity Right Eye Distance:   Left Eye  Distance:   Bilateral Distance:    Right Eye Near:   Left Eye Near:    Bilateral Near:     Physical Exam Constitutional:      General: She  is not in acute distress.    Appearance: Normal appearance. She is not ill-appearing.  HENT:     Head: Normocephalic and atraumatic.  Cardiovascular:     Rate and Rhythm: Normal rate and regular rhythm.  Pulmonary:     Effort: Pulmonary effort is normal.     Breath sounds: Normal breath sounds. No wheezing, rhonchi or rales.  Musculoskeletal:     Comments: No significant tenderness of the thoracic or lumbar spine.  Neurological:     Mental Status: She is alert.  Psychiatric:        Mood and Affect: Mood normal.        Behavior: Behavior normal.     UC Treatments / Results  Labs (all labs ordered are listed, but only abnormal results are displayed) Labs Reviewed - No data to display  EKG   Radiology No results found.  Procedures Procedures (including critical care time)  Medications Ordered in UC Medications - No data to display  Initial Impression / Assessment and Plan / UC Course  I have reviewed the triage vital signs and the nursing notes.  Pertinent labs & imaging results that were available during my care of the patient were reviewed by me and considered in my medical decision making (see chart for details).    80 year old female presents with back pain.  No indications for imaging at this time.  Advised rest and heat.  Prednisone and baclofen as prescribed.  Final Clinical Impressions(s) / UC Diagnoses   Final diagnoses:  Acute bilateral thoracic back pain     Discharge Instructions     Rest.  Heat.  Medications as prescribed.  Take care  Dr. Lacinda Axon    ED Prescriptions    Medication Sig Dispense Auth. Provider   baclofen (LIORESAL) 10 MG tablet Take 0.5-1 tablets (5-10 mg total) by mouth 3 (three) times daily as needed for muscle spasms. 20 each Markis Langland G, DO   predniSONE (STERAPRED UNI-PAK 21  TAB) 10 MG (21) TBPK tablet 6 tablets on day 1; decrease by 1 tablet daily until gone. 21 tablet Thersa Salt G, DO     PDMP not reviewed this encounter.   Coral Spikes, [place] 04/23/20 1513

## 2020-04-23 NOTE — Discharge Instructions (Signed)
Rest.  Heat.  Medications as prescribed.  Take care  Dr. Lacinda Axon

## 2020-05-20 IMAGING — MG MM DIGITAL DIAGNOSTIC UNILAT*L* W/ TOMO W/ CAD
4 series · 4 of 12 positions shown · non-contrast
Comparison: Previous exam(s).

CLINICAL DATA: 78-year-old female for further evaluation of
possible LEFT breast mass on screening mammogram.

EXAM:
DIGITAL DIAGNOSTIC LEFT MAMMOGRAM WITH CAD AND TOMO
ULTRASOUND LEFT BREAST

[L MLO synth-2D]
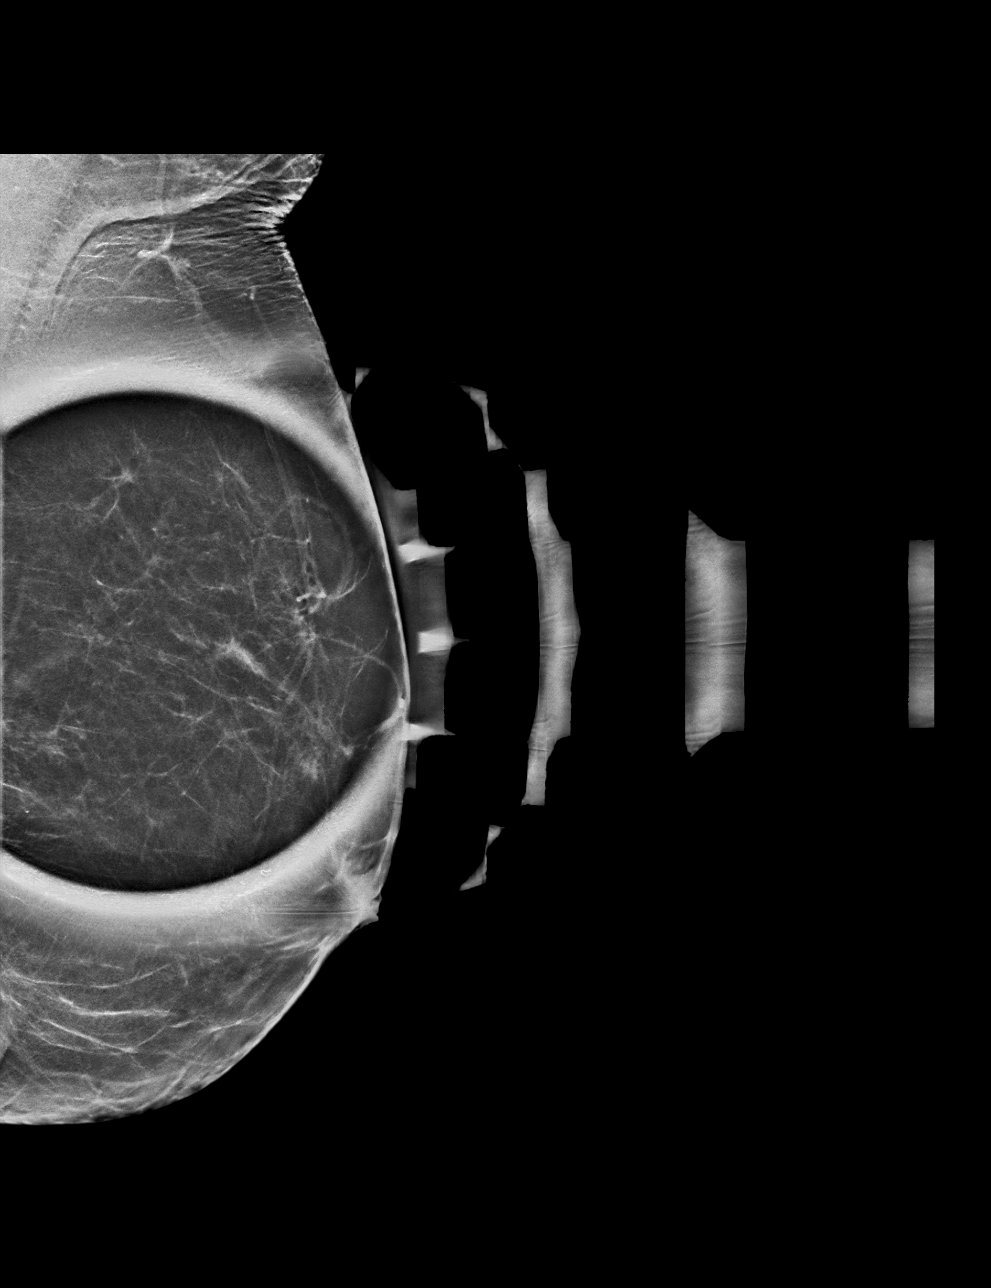

[L CC synth-2D]
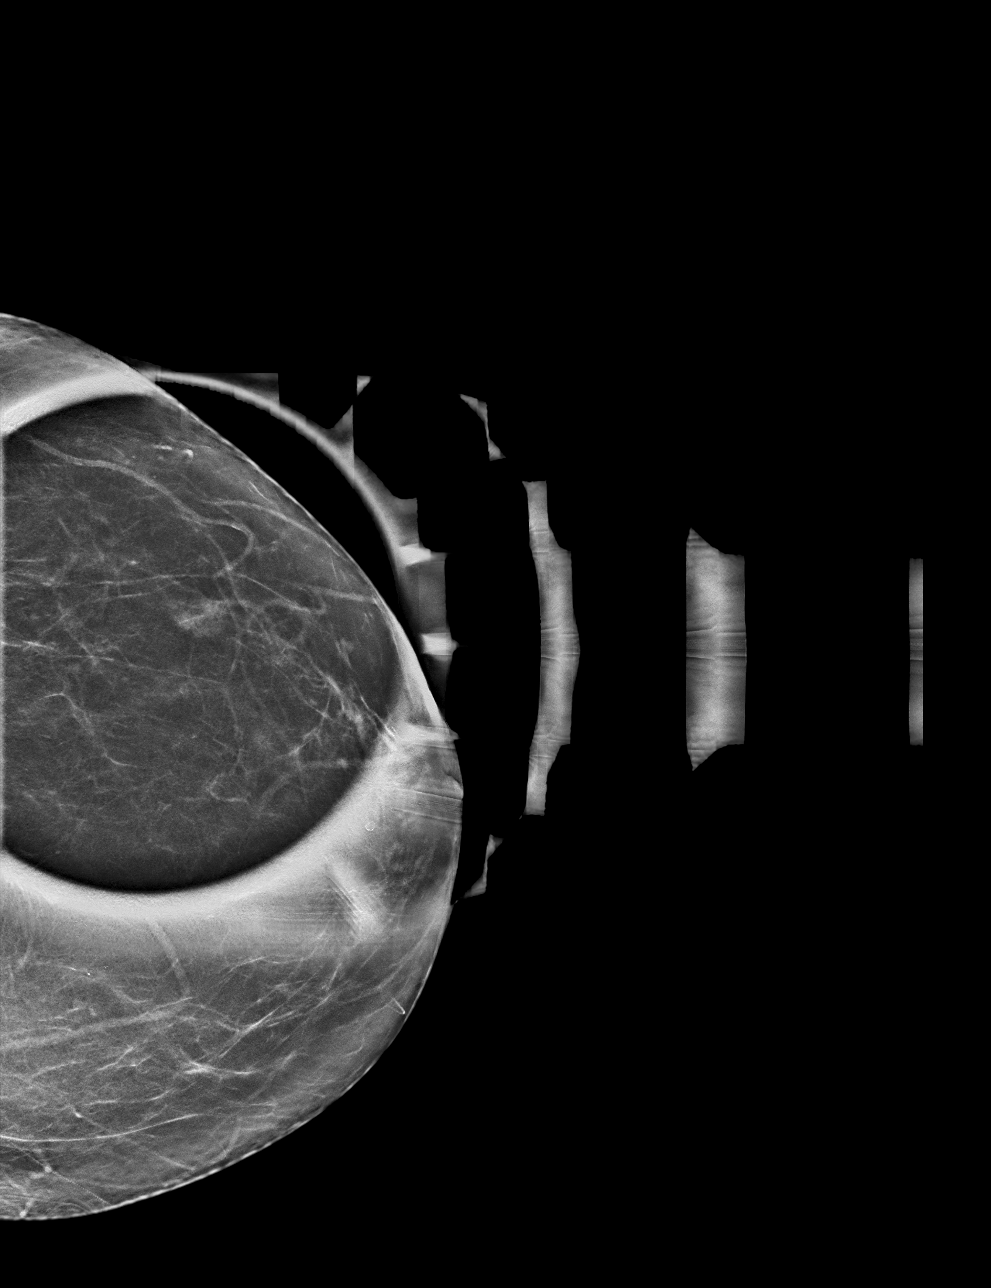

[L MLO tomo · tomo slice 25/50.0]
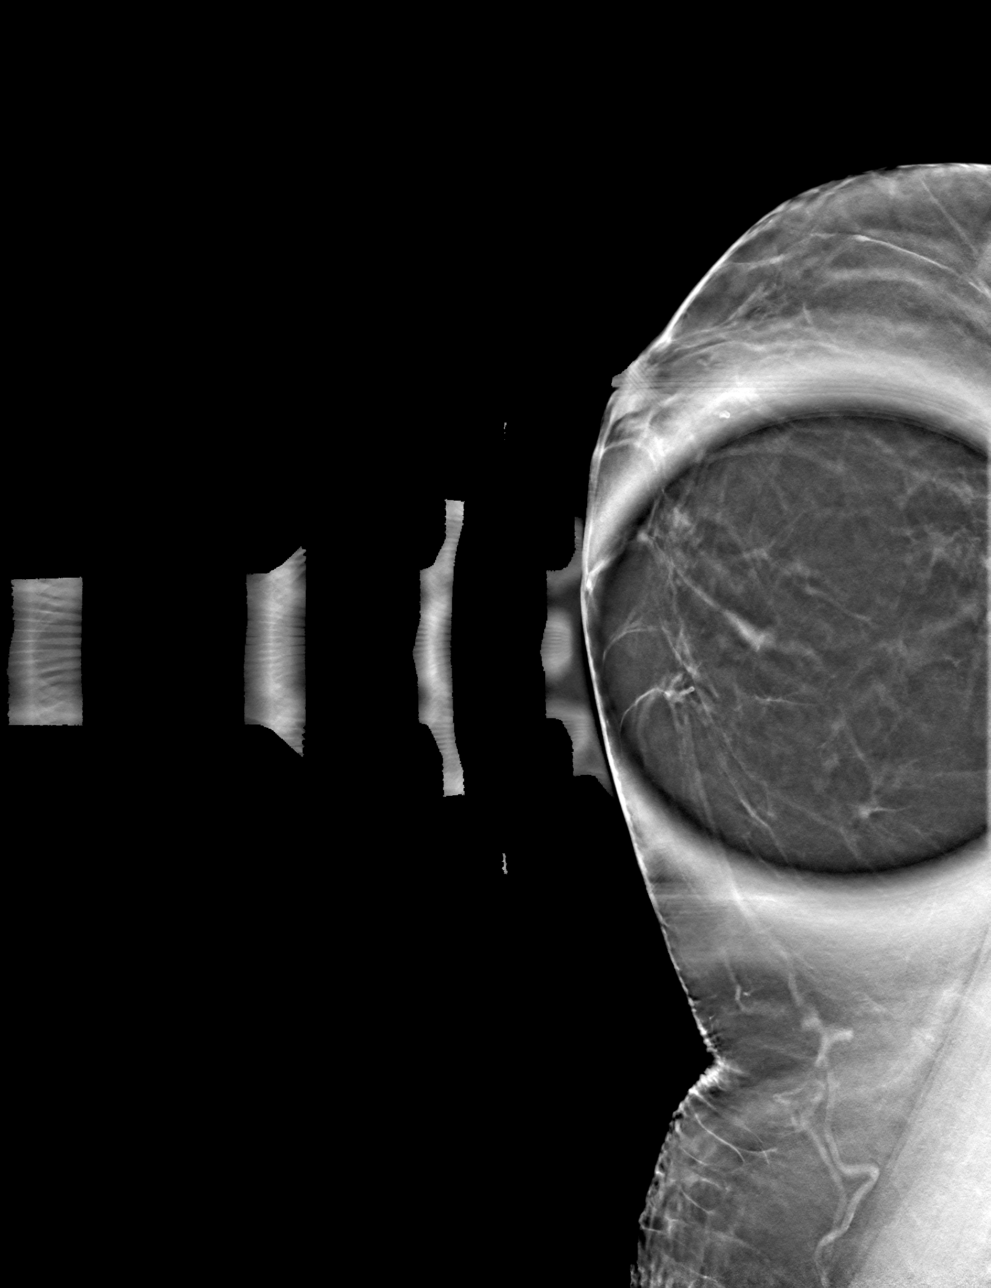

[L CC tomo · tomo slice 25/49.0]
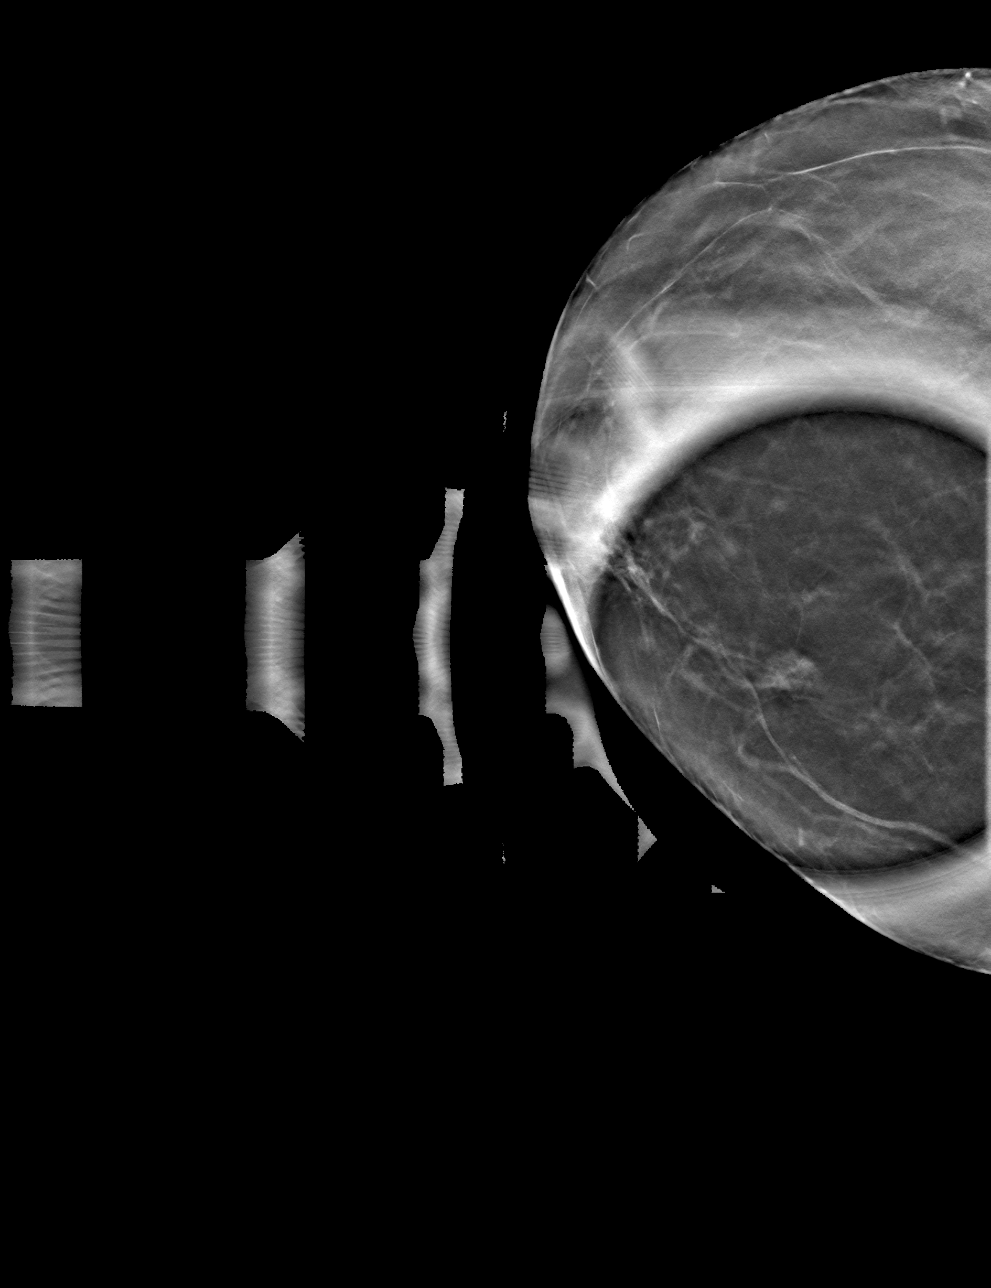

[4 of 12 positions shown; findings below may reference images not displayed]

ACR Breast Density Category b: There are scattered areas of
fibroglandular density.
FINDINGS: 2D/3D spot compression views of the LEFT breast demonstrate a
persistent partially circumscribed partially obscured mass within
the UPPER-OUTER LEFT breast.

Mammographic images were processed with CAD.

Targeted ultrasound is performed, showing a 0.5 x 0.4 x 0.8 cm
probable cluster of microcysts at the 2 o'clock position of the LEFT
breast 4 cm from the nipple. No internal vascular flow is
identified.
IMPRESSION: Probable cluster of microcyst within the UPPER-OUTER LEFT breast
corresponding to the screening study finding. Options of short-term
follow-up and tissue sampling were presented to the patient. 6 month
follow-up is recommended to ensure stability.

RECOMMENDATION:
LEFT diagnostic mammogram and LEFT breast ultrasound in 6 months.

I have discussed the findings and recommendations with the patient.
If applicable, a reminder letter will be sent to the patient
regarding the next appointment.

BI-RADS CATEGORY  3: Probably benign.

## 2020-05-27 ENCOUNTER — Other Ambulatory Visit: Payer: Self-pay | Admitting: Family

## 2020-05-27 DIAGNOSIS — E876 Hypokalemia: Secondary | ICD-10-CM

## 2020-05-27 DIAGNOSIS — I10 Essential (primary) hypertension: Secondary | ICD-10-CM

## 2020-06-09 ENCOUNTER — Other Ambulatory Visit: Payer: Self-pay

## 2020-06-09 DIAGNOSIS — I6521 Occlusion and stenosis of right carotid artery: Secondary | ICD-10-CM

## 2020-06-12 DIAGNOSIS — H40053 Ocular hypertension, bilateral: Secondary | ICD-10-CM | POA: Diagnosis not present

## 2020-07-07 ENCOUNTER — Other Ambulatory Visit: Payer: Self-pay | Admitting: Internal Medicine

## 2020-07-08 ENCOUNTER — Other Ambulatory Visit: Payer: Self-pay | Admitting: Family

## 2020-07-16 ENCOUNTER — Other Ambulatory Visit: Payer: Self-pay | Admitting: Family

## 2020-07-16 DIAGNOSIS — I10 Essential (primary) hypertension: Secondary | ICD-10-CM

## 2020-07-30 IMAGING — CT CT CHEST W/O CM
2 of 4 series · 15 of 36 positions shown, 18 images · non-contrast
Comparison: Radiograph 07/10/2019

CLINICAL DATA: Follow-up nodule on chest radiography

EXAM:
CT CHEST WITHOUT CONTRAST
TECHNIQUE: Multidetector CT imaging of the chest was performed following the
standard protocol without IV contrast.

[Series 2: chest 2.00 · axial · 0.70mm/px · z∈[-1175,-907]mm · 12 of 160 slices shown, 15 images]
[im 13/160  mediastinal]
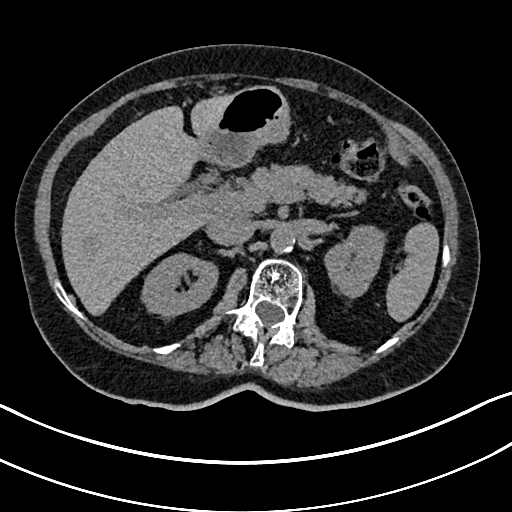
[im 13/160  lung]
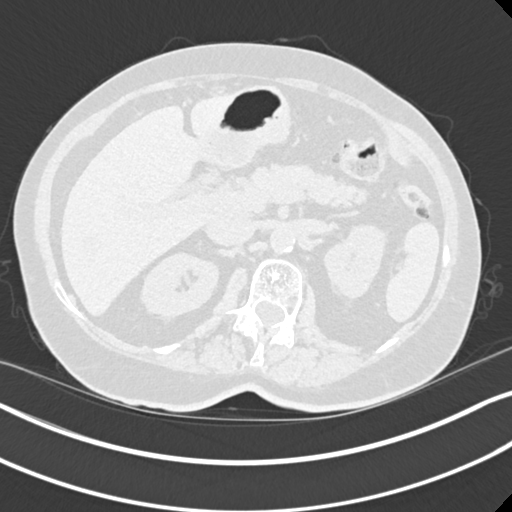
[im 25/160  lung]
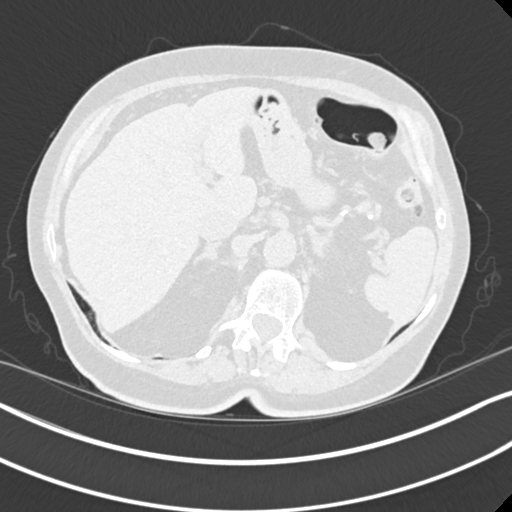
[im 37/160  lung]
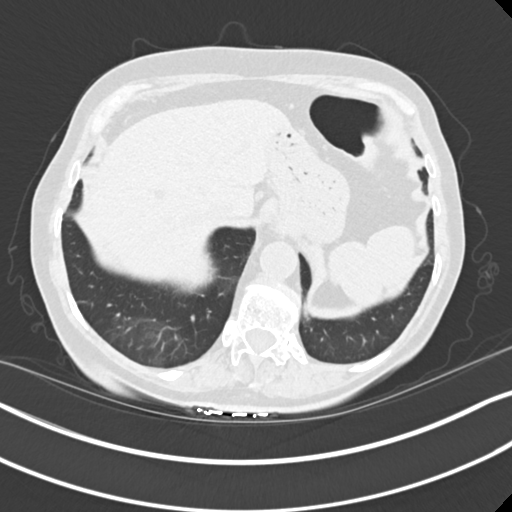
[im 49/160  lung]
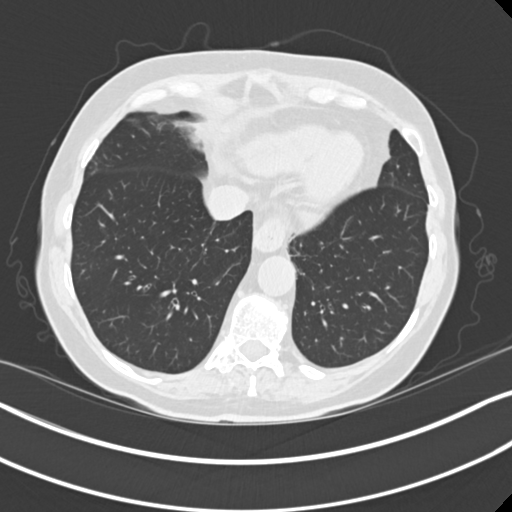
[im 62/160  mediastinal]
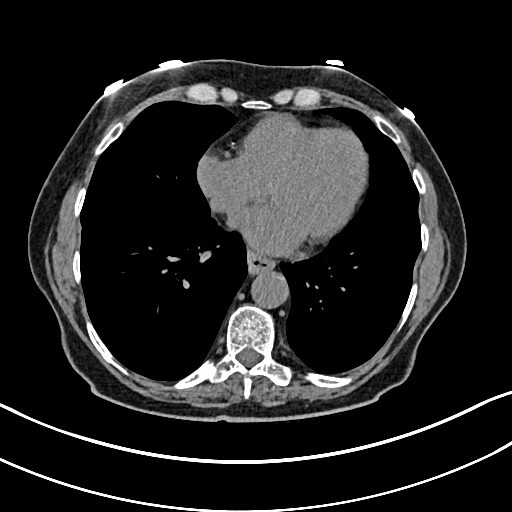
[im 62/160  lung]
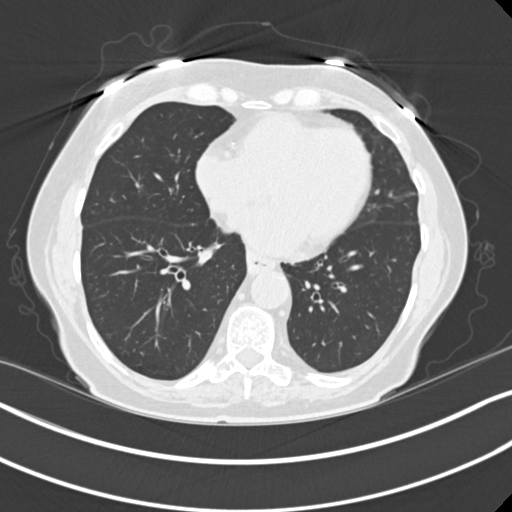
[im 74/160  lung]
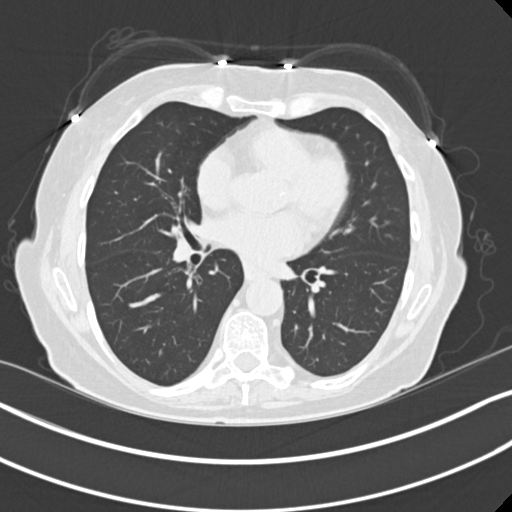
[im 86/160  lung]
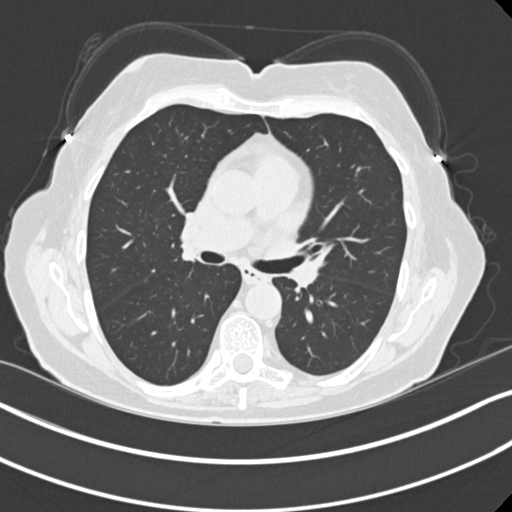
[im 98/160  lung]
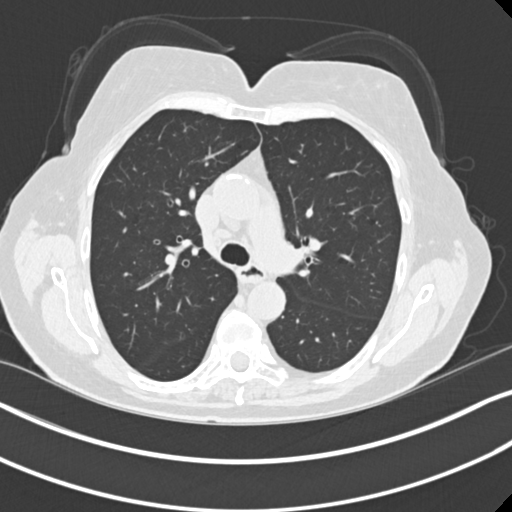
[im 111/160  mediastinal]
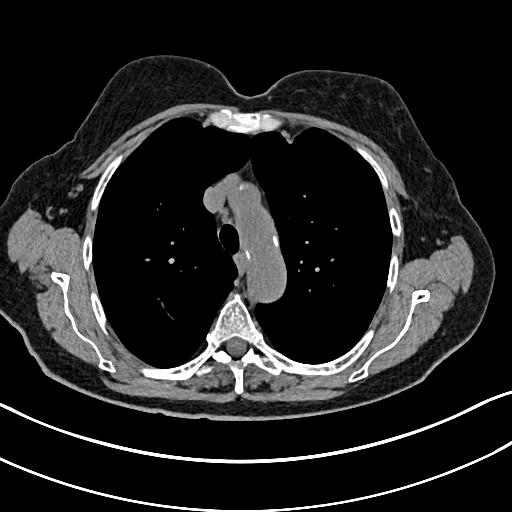
[im 111/160  lung]
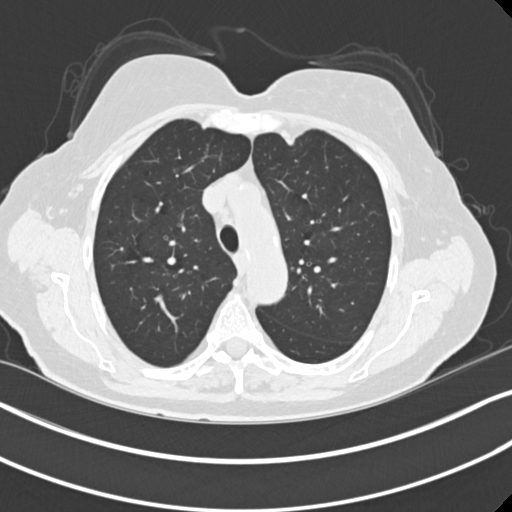
[im 123/160  lung]
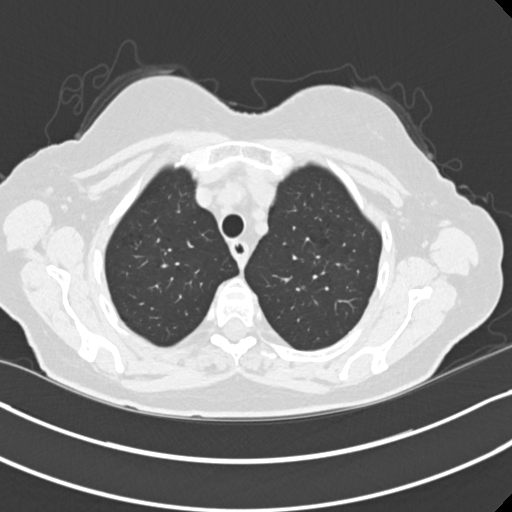
[im 135/160  lung]
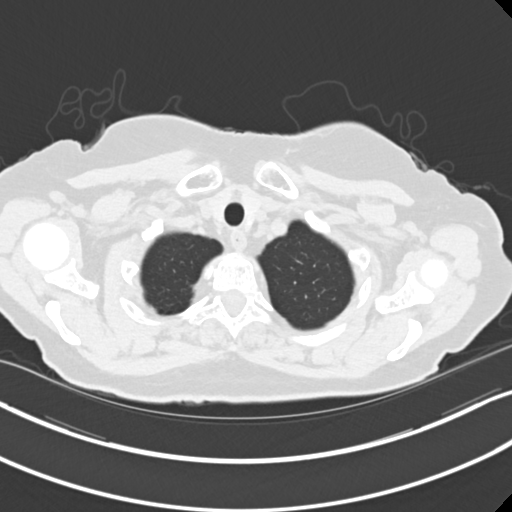
[im 147/160  lung]
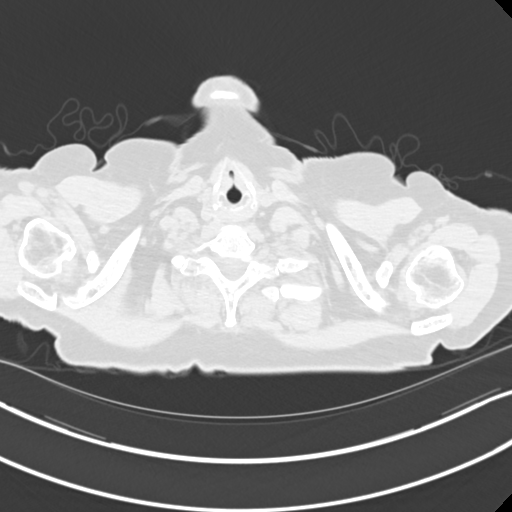

[Series 5: coronals chest 2.00 cor · coronal · 0.63mm/px · 3 of 155 slices shown]
[im 31/155  lung]
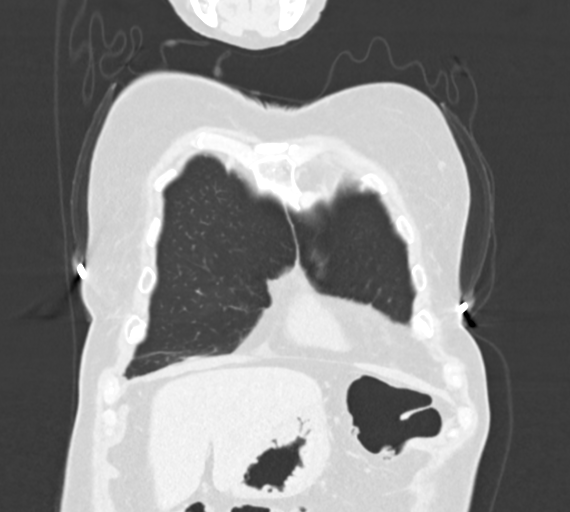
[im 62/155  lung]
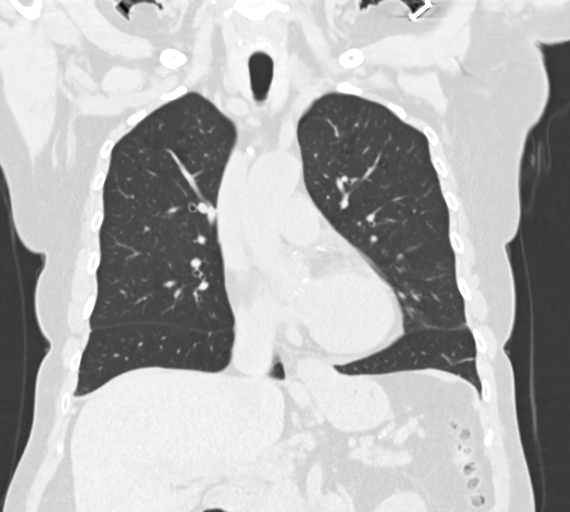
[im 93/155  lung]
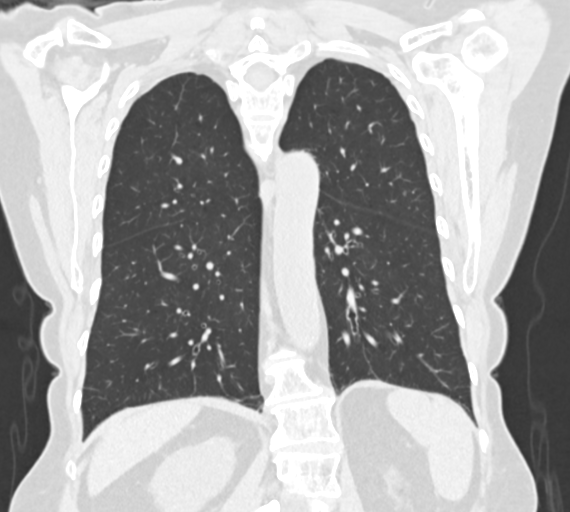

[15 of 36 positions shown; findings below may reference images not displayed]

FINDINGS: Cardiovascular: Normal cardiac size. Trace pericardial fluid, likely
within physiologic normal. Coronary artery calcifications
predominantly in the LAD, diagonals and right coronary artery.
Additional calcifications seen upon the aortic leaflets.
Atherosclerotic plaque within the normal caliber aorta. Normal 3
vessel branching of the aortic arch. Proximal great vessels
demonstrate some mild atherosclerotic plaque as well. Central
pulmonary arteries are normal caliber. Luminal evaluation of the
vasculature is limited in the absence of contrast media. No major
venous abnormalities are evident.

Mediastinum/Nodes: No mediastinal fluid or gas. Normal thyroid gland
and thoracic inlet. No acute abnormality of the trachea or
esophagus. No worrisome mediastinal or axillary adenopathy. Hilar
nodal evaluation is limited in the absence of intravenous contrast
media.

Lungs/Pleura: Mild centrilobular emphysematous changes are most
pronounced towards the apices with slight hyperinflation and
flattening of the diaphragms as well as an increased AP diameter of
the chest. Mild airways thickening may reflect some bronchitic
features as well. No consolidation, features of edema, pneumothorax,
or effusion. Small bilateral fat containing Bochdalek's hernias.

No concerning pulmonary nodules or masses are seen nor a convincing
CT correlate to the opacity on chest radiography which may be
artifactual or related to projection of soft tissues.

Upper Abdomen: Subcentimeter hypoattenuating focus in the right lobe
liver (2/125) too small to fully characterize on CT imaging but
statistically likely benign. No acute abnormalities present in the
visualized portions of the upper abdomen.

Musculoskeletal: Exaggerated thoracic kyphosis. Multilevel
discogenic facet degenerative changes throughout the spine.
Multilevel Schmorl's node formations. Anterior wedging deformity at
T7 has a chronic appearance with to 50% height loss anteriorly.
IMPRESSION: 1. No concerning pulmonary nodules or masses are seen.
2. No convincing CT correlate to the opacity on chest radiography
which may be artifactual or related to projection of soft tissues.
3. No acute intrathoracic process.
4. There are features emphysema and chronic bronchitis. ( Emphysema
(2ALW0-ED6.J).)
5. Chronic appearing anterior wedging deformity at T7 with to 50%
height loss anteriorly.
6. Aortic Atherosclerosis (2ALW0-RIV.V).

## 2020-08-21 ENCOUNTER — Other Ambulatory Visit: Payer: Self-pay | Admitting: Family

## 2020-08-21 DIAGNOSIS — F329 Major depressive disorder, single episode, unspecified: Secondary | ICD-10-CM

## 2020-08-21 DIAGNOSIS — F32A Depression, unspecified: Secondary | ICD-10-CM

## 2020-09-05 ENCOUNTER — Ambulatory Visit (INDEPENDENT_AMBULATORY_CARE_PROVIDER_SITE_OTHER): Payer: Medicare Other

## 2020-09-05 ENCOUNTER — Other Ambulatory Visit: Payer: Self-pay

## 2020-09-05 VITALS — BP 128/71 | HR 58 | Temp 97.3°F | Resp 15 | Ht 60.0 in | Wt 148.8 lb

## 2020-09-05 DIAGNOSIS — Z0000 Encounter for general adult medical examination without abnormal findings: Secondary | ICD-10-CM

## 2020-09-05 DIAGNOSIS — Z1231 Encounter for screening mammogram for malignant neoplasm of breast: Secondary | ICD-10-CM | POA: Diagnosis not present

## 2020-09-05 DIAGNOSIS — Z1211 Encounter for screening for malignant neoplasm of colon: Secondary | ICD-10-CM | POA: Diagnosis not present

## 2020-09-05 NOTE — Progress Notes (Signed)
Subjective:   Christie Stevenson is a 80 y.o. female who presents for Medicare Annual (Subsequent) preventive examination.  Review of Systems    No ROS.  Medicare Wellness Virtual Visit.  Visual/audio telehealth visit, UTA vital signs.   See social history for additional risk factors.   Cardiac Risk Factors include: advanced age (>54mn, >>14women)     Objective:    Today's Vitals   09/05/20 1325  BP: 128/71  Pulse: (!) 58  Resp: 15  Temp: (!) 97.3 F (36.3 C)  SpO2: 98%  Weight: 148 lb 12.8 oz (67.5 kg)  Height: 5' (1.524 m)   Body mass index is 29.06 kg/m.  Advanced Directives 09/05/2020 09/05/2019 02/22/2017 02/20/2016 05/26/2015 02/19/2015  Does Patient Have a Medical Advance Directive? Yes Yes No No No No  Type of [place] - - - -  Does patient want to make changes to medical advance directive? - No - Patient declined - - - -  Copy of HAddyin Chart? No - copy requested No - copy requested - - - -  Would patient like information on creating a medical advance directive? - - Yes (MAU/Ambulatory/Procedural Areas - Information given) No - Patient declined No - patient declined information Yes - Educational materials given    Current Medications (verified) Outpatient Encounter Medications as of 09/05/2020  Medication Sig   Apoaequorin 10 MG CAPS Take by mouth.    atorvastatin (LIPITOR) 40 MG tablet TAKE 1 TABLET BY MOUTH EVERY DAY   baclofen (LIORESAL) 10 MG tablet Take 0.5-1 tablets (5-10 mg total) by mouth 3 (three) times daily as needed for muscle spasms.   citalopram (CELEXA) 10 MG tablet TAKE 1 TABLET BY MOUTH EVERY DAY   latanoprost (XALATAN) 0.005 % ophthalmic solution Place 1 drop into the right eye at bedtime.   metoprolol succinate (TOPROL-XL) 25 MG 24 hr tablet TAKE 1 TABLET BY MOUTH EVERY DAY   Multiple Vitamins-Minerals (MULTIPLE VITAMINS/WOMENS) tablet Take 1 tablet by  mouth daily.   omeprazole (PRILOSEC) 20 MG capsule Take 20 mg by mouth daily.   potassium chloride (KLOR-CON) 10 MEQ tablet TAKE 1 TABLET BY MOUTH EVERY OTHER DAY.   triamterene-hydrochlorothiazide (MAXZIDE-25) 37.5-25 MG tablet TAKE 1 TABLET BY MOUTH EVERY DAY   [DISCONTINUED] predniSONE (STERAPRED UNI-PAK 21 TAB) 10 MG (21) TBPK tablet 6 tablets on day 1; decrease by 1 tablet daily until gone.   No facility-administered encounter medications on file as of 09/05/2020.    Allergies (verified) Fosamax [alendronate]   History: Past Medical History:  Diagnosis Date   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ease    Chronic kidney disease    STAGE 3   Depression    GERD (gastroesophageal reflux disease)    History of colon polyps    History of diverticulitis    History of kidney stones    Hyperlipidemia    Hypertension    Migraine    Osteoporosis    Past Surgical History:  Procedure Laterality Date   CATARACT EXTRACTION Left 2014   CATARACT EXTRACTION W/PHACO Right 04/01/2015   Procedure: CATARACT EXTRACTION PHACO AND INTRAOCULAR LENS PLACEMENT ([place];  Surgeon: WBirder Robson MD;  Location: [facility];  Service: Ophthalmology;  Laterality: Right;  [place]00:47 AP% 20.8 CDE 9.76 fluid pack lot # [id number]   COLONOSCOPY     COLONOSCOPY WITH PROPOFOL N/A 05/26/2015   Procedure: COLONOSCOPY WITH PROPOFOL;  Surgeon: RGavin Pound  Vira Agar, MD;  Location: [facility];  Service: Endoscopy;  Laterality: N/A;   TUBAL LIGATION     Family History  Problem Relation Age of Onset   Arthritis Mother    Hypertension Mother    Osteoporosis Mother    Osteoporosis Sister    Osteoporosis Brother    Heart disease Brother 65       CABG and stents   Diabetes Daughter    Diabetes Son    Cancer Paternal Uncle        Prostate Cancer   Cancer Sister        Breast Cancer    Osteoporosis Sister    Breast cancer Sister        mid 65's   Osteoporosis Brother    Heart attack Brother 75    Arrhythmia Brother        s/p Industrial/product designer   Diabetes Daughter    Social History   Socioeconomic History   Marital status: Divorced    Spouse name: Not on file   Number of children: 5   Years of education: 12   Highest education level: Not on file  Occupational History   Occupation: Accounting work    Comment: Retired  Tobacco Use   Smoking status: Former    Packs/day: 1.00    Years: 40.00    Pack years: 40.00    Types: Cigarettes    Quit date: 07/22/1994    Years since quitting: 26.1   Smokeless tobacco: Never  Vaping Use   Vaping Use: Never used  Substance and Sexual Activity   Alcohol use: No    Comment: 1 glass of wine on holidays.   Drug use: No   Sexual activity: Never  Other Topics Concern   Not on file  Social History Narrative   Caffeine - 2 cups of coffee   Exercise - none regularly   Diet - tries to eat as healthy as possible but can do better      Does not have a Living Will         Social Determinants of Health   Financial Resource Strain: Not on file  Food Insecurity: No Food Insecurity   Worried About Charity fundraiser in the Last Year: Never true   Ran Out of Food in the Last Year: Never true  Transportation Needs: No Transportation Needs   Lack of Transportation (Medical): No   Lack of Transportation (Non-Medical): No  Physical Activity: Not on file  Stress: No Stress Concern Present   Feeling of Stress : Not at all  Social Connections: Unknown   Frequency of Communication with Friends and Family: More than three times a week   Frequency of Social Gatherings with Friends and Family: More than three times a week   Attends Religious Services: Not on Electrical engineer or Organizations: Not on file   Attends Archivist Meetings: Not on file   Marital Status: Not on file    Tobacco Counseling Counseling given: Not Answered   Clinical Intake:  Pre-visit preparation completed: Yes        Diabetes: No  How often  do you need to have someone help you when you read instructions, pamphlets, or other written materials from your doctor or pharmacy?: 1 - Never    Interpreter Needed?: No      Activities of Daily Living In your present state of health, do you have any difficulty performing the following activities: 09/05/2020  Hearing? N  Vision? N  Difficulty concentrating or making decisions? N  Walking or climbing stairs? N  Dressing or bathing? N  Doing errands, shopping? N  Preparing Food and eating ? N  Using the Toilet? N  In the past six months, have you accidently leaked urine? N  Do you have problems with loss of bowel control? N  Managing your Medications? N  Managing your Finances? N  Housekeeping or managing your Housekeeping? N  Some recent data might be hidden    Patient Care Team: Burnard Hawthorne, FNP as PCP - General (Family Medicine)  Indicate any recent Medical Services you may have received from other than [facility] providers in the past year (date may be approximate).     Assessment:   This is a routine wellness examination for [facility]  Hearing/Vision screen Hearing Screening - Comments:: Patient is able to hear conversational tones without difficulty. No issues reported. Vision Screening - Comments:: Followed by [facility]  Wears corrective lenses Visits every six months  Cataract extraction, bilateral  They have regular follow up with the ophthalmologist  Dietary issues and exercise activities discussed: Current Exercise Habits: Home exercise routine, Intensity: Mild   Goals Addressed               This Visit's Progress     Patient Stated     I would like to lose weight (pt-stated)        Increase physical activity       Depression Screen PHQ 2/9 Scores 09/05/2020 09/05/2019 06/25/2019 11/09/2018 02/22/2017 02/20/2016 02/19/2015  PHQ - 2 Score 0 0 0 6 0 0 0  PHQ- 9 Score - - 0 9 - - -    Fall Risk Fall Risk  09/05/2020 09/05/2019 06/25/2019 03/06/2019  11/08/2018  Falls in the past year? 0 0 0 0 0  Number falls in past yr: - 0 0 - -  Injury with Fall? - - 0 - -  Follow up Falls evaluation completed Falls evaluation completed Falls evaluation completed Falls evaluation completed Falls evaluation completed    [place]: Adequate lighting in your home to reduce risk of falls? Yes   ASSISTIVE DEVICES UTILIZED TO PREVENT FALLS: Use of a cane, walker or w/c? No   TIMED UP AND GO: Was the test performed? Yes .  Length of time to ambulate 10 feet: 10 sec.   Gait steady and fast without use of assistive device  Cognitive Function: Patient is alert and oriented x3.  MMSE - Mini Mental State Exam 02/19/2015  Orientation to time 5  Orientation to Place 5  Registration 3  Attention/ Calculation 5  Recall 3  Language- name 2 objects 2  Language- repeat 1  Language- follow 3 step command 3  Language- read & follow direction 1  Write a sentence 1  Copy design 1  Total score 30     6CIT Screen 09/05/2019 02/22/2017  What Year? 0 points 0 points  What month? 0 points 0 points  What time? - 0 points  Count back from 20 - 0 points  Months in reverse 0 points 0 points  Repeat phrase 0 points 0 points  Total Score - 0    Immunizations Immunization History  Administered Date(s) Administered   Fluad Quad(high Dose 65+) 10/17/2019   Influenza Split 10/19/2013   Influenza, High Dose Seasonal PF 10/15/2016, 09/26/2017, 10/11/2018   Influenza-Unspecified 12/02/2012, 10/19/2015, 10/26/2016, 10/19/2018   PFIZER(Purple Top)SARS-COV-2  Vaccination 02/22/2019, 03/15/2019, 12/20/2019, 05/08/2020   Pneumococcal Conjugate-13 12/02/2012   Pneumococcal Polysaccharide-23 03/17/2015    TDAP status: Due, Education has been provided regarding the importance of this vaccine. Advised may receive this vaccine at local pharmacy or Health Dept. Aware to provide a copy of the vaccination record if obtained from local pharmacy  or Health Dept. Verbalized acceptance and understanding. Deferred.   Health Maintenance Health Maintenance  Topic Date Due   Zoster Vaccines- Shingrix (1 of 2) Never done   COLONOSCOPY (Pts 45-78yr Insurance coverage will need to be confirmed)  05/25/2020   INFLUENZA VACCINE  08/18/2020   COVID-19 Vaccine (5 - Booster for Pfizer series) 09/07/2020   TETANUS/TDAP  09/05/2021 (Originally 07/24/1959)   DEXA SCAN  Completed   PNA vac Low Risk Adult  Completed   HPV VACCINES  Aged Out   Hepatitis C Screening  Discontinued   Colonoscopy- ordered per request  Mammogram- ordered per request  Lung Cancer Screening: (Low Dose CT Chest recommended if Age 79-80years, 30 pack-year currently smoking OR have quit w/in 15years.) does not qualify.   Vision Screening: Recommended annual ophthalmology exams for early detection of glaucoma and other disorders of the eye.  Dental Screening: Recommended annual dental exams for proper oral hygiene  Community Resource Referral / Chronic Care Management: CRR required this visit?  No   CCM required this visit?  No      Plan:    I have personally reviewed and noted the following in the patient's chart:   Medical and social history Use of alcohol, tobacco or illicit drugs  Current medications and supplements including opioid prescriptions. Not taking opioid.  Functional ability and status Nutritional status Physical activity Advanced directives List of other physicians Hospitalizations, surgeries, and ER visits in previous 12 months Vitals Screenings to include cognitive, depression, and falls Referrals and appointments  In addition, I have reviewed and discussed with patient certain preventive protocols, quality metrics, and best practice recommendations. A written personalized care plan for preventive services as well as general preventive health recommendations were provided to patient.     OVarney Biles LPN   [id number]

## 2020-09-05 NOTE — Patient Instructions (Addendum)
Christie Stevenson , Thank you for taking time to come for your Medicare Wellness Visit. I appreciate your ongoing commitment to your health goals. Please review the following plan we discussed and let me know if I can assist you in the future.   These are the goals we discussed:  Goals       Patient Stated     I would like to lose weight (pt-stated)      Increase physical activity        This is a list of the screening recommended for you and due dates:  Health Maintenance  Topic Date Due   Zoster (Shingles) Vaccine (1 of 2) Never done   Colon Cancer Screening  05/25/2020   Flu Shot  08/18/2020   COVID-19 Vaccine (5 - Booster for Pfizer series) 09/07/2020   Tetanus Vaccine  09/05/2021*   DEXA scan (bone density measurement)  Completed   Pneumonia vaccines  Completed   HPV Vaccine  Aged Out   Hepatitis C Screening: USPSTF Recommendation to screen - Ages 2-79 yo.  Discontinued  *Topic was postponed. The date shown is not the original due date.    Advanced directives: End of life planning; Advance aging; Advanced directives discussed.  Copy of current HCPOA/Living Will requested.    Conditions/risks identified: none new  [place] [phone]  Colonoscopy- facility to contact and schedule  Follow up in one year for your annual wellness visit    Preventive Care 65 Years and Older, Female Preventive care refers to lifestyle choices and visits with your health care provider that can promote health and wellness. What does preventive care include? A yearly physical exam. This is also called an annual well check. Dental exams once or twice a year. Routine eye exams. Ask your health care provider how often you should have your eyes checked. Personal lifestyle choices, including: Daily care of your teeth and gums. Regular physical activity. Eating a healthy diet. Avoiding tobacco and drug use. Limiting alcohol use. Practicing safe sex. Taking low-dose  aspirin every day. Taking vitamin and mineral supplements as recommended by your health care provider. What happens during an annual well check? The services and screenings done by your health care provider during your annual well check will depend on your age, overall health, lifestyle risk factors, and family history of disease. Counseling  Your health care provider may ask you questions about your: Alcohol use. Tobacco use. Drug use. Emotional well-being. Home and relationship well-being. Sexual activity. Eating habits. History of falls. Memory and ability to understand (cognition). Work and work Statistician. Reproductive health. Screening  You may have the following tests or measurements: Height, weight, and BMI. Blood pressure. Lipid and cholesterol levels. These may be checked every 5 years, or more frequently if you are over 51 years old. Skin check. Lung cancer screening. You may have this screening every year starting at age 37 if you have a 30-pack-year history of smoking and currently smoke or have quit within the past 15 years. Fecal occult blood test (FOBT) of the stool. You may have this test every year starting at age 3. Flexible sigmoidoscopy or colonoscopy. You may have a sigmoidoscopy every 5 years or a colonoscopy every 10 years starting at age 31. Hepatitis C blood test. Hepatitis B blood test. Sexually transmitted disease (STD) testing. Diabetes screening. This is done by checking your blood sugar (glucose) after you have not eaten for a while (fasting). You may have this done every 1-3 years. Bone  density scan. This is done to screen for osteoporosis. You may have this done starting at age 62. Mammogram. This may be done every 1-2 years. Talk to your health care provider about how often you should have regular mammograms. Talk with your health care provider about your test results, treatment options, and if necessary, the need for more tests. Vaccines  Your  health care provider may recommend certain vaccines, such as: Influenza vaccine. This is recommended every year. Tetanus, diphtheria, and acellular pertussis (Tdap, Td) vaccine. You may need a Td booster every 10 years. Zoster vaccine. You may need this after age 15. Pneumococcal 13-valent conjugate (PCV13) vaccine. One dose is recommended after age 29. Pneumococcal polysaccharide (PPSV23) vaccine. One dose is recommended after age 69. Talk to your health care provider about which screenings and vaccines you need and how often you need them. This information is not intended to replace advice given to you by your health care provider. Make sure you discuss any questions you have with your health care provider. Document Released: 01/31/2015 Document Revised: 09/24/2015 Document Reviewed: 11/05/2014 Elsevier Interactive Patient Education  2017 [place] Prevention in the Home Falls can cause injuries. They can happen to people of all ages. There are many things you can do to make your home safe and to help prevent falls. What can I do on the outside of my home? Regularly fix the edges of walkways and driveways and fix any cracks. Remove anything that might make you trip as you walk through a door, such as a raised step or threshold. Trim any bushes or trees on the path to your home. Use bright outdoor lighting. Clear any walking paths of anything that might make someone trip, such as rocks or tools. Regularly check to see if handrails are loose or broken. Make sure that both sides of any steps have handrails. Any raised decks and porches should have guardrails on the edges. Have any leaves, snow, or ice cleared regularly. Use sand or salt on walking paths during winter. Clean up any spills in your garage right away. This includes oil or grease spills. What can I do in the bathroom? Use night lights. Install grab bars by the toilet and in the tub and shower. Do not use towel bars as  grab bars. Use non-skid mats or decals in the tub or shower. If you need to sit down in the shower, use a plastic, non-slip stool. Keep the floor dry. Clean up any water that spills on the floor as soon as it happens. Remove soap buildup in the tub or shower regularly. Attach bath mats securely with double-sided non-slip rug tape. Do not have throw rugs and other things on the floor that can make you trip. What can I do in the bedroom? Use night lights. Make sure that you have a light by your bed that is easy to reach. Do not use any sheets or blankets that are too big for your bed. They should not hang down onto the floor. Have a firm chair that has side arms. You can use this for support while you get dressed. Do not have throw rugs and other things on the floor that can make you trip. What can I do in the kitchen? Clean up any spills right away. Avoid walking on wet floors. Keep items that you use a lot in easy-to-reach places. If you need to reach something above you, use a strong step stool that has a grab bar. Keep  electrical cords out of the way. Do not use floor polish or wax that makes floors slippery. If you must use wax, use non-skid floor wax. Do not have throw rugs and other things on the floor that can make you trip. What can I do with my stairs? Do not leave any items on the stairs. Make sure that there are handrails on both sides of the stairs and use them. Fix handrails that are broken or loose. Make sure that handrails are as long as the stairways. Check any carpeting to make sure that it is firmly attached to the stairs. Fix any carpet that is loose or worn. Avoid having throw rugs at the top or bottom of the stairs. If you do have throw rugs, attach them to the floor with carpet tape. Make sure that you have a light switch at the top of the stairs and the bottom of the stairs. If you do not have them, ask someone to add them for you. What else can I do to help prevent  falls? Wear shoes that: Do not have high heels. Have rubber bottoms. Are comfortable and fit you well. Are closed at the toe. Do not wear sandals. If you use a stepladder: Make sure that it is fully opened. Do not climb a closed stepladder. Make sure that both sides of the stepladder are locked into place. Ask someone to hold it for you, if possible. Clearly mark and make sure that you can see: Any grab bars or handrails. First and last steps. Where the edge of each step is. Use tools that help you move around (mobility aids) if they are needed. These include: Canes. Walkers. Scooters. Crutches. Turn on the lights when you go into a dark area. Replace any light bulbs as soon as they burn out. Set up your furniture so you have a clear path. Avoid moving your furniture around. If any of your floors are uneven, fix them. If there are any pets around you, be aware of where they are. Review your medicines with your doctor. Some medicines can make you feel dizzy. This can increase your chance of falling. Ask your doctor what other things that you can do to help prevent falls. This information is not intended to replace advice given to you by your health care provider. Make sure you discuss any questions you have with your health care provider. Document Released: 10/31/2008 Document Revised: 06/12/2015 Document Reviewed: 02/08/2014 Elsevier Interactive Patient Education  2017 [employer].

## 2020-09-16 ENCOUNTER — Telehealth: Payer: Self-pay

## 2020-09-16 NOTE — Telephone Encounter (Signed)
CALLED PATIENT NO ANSWER LEFT VOICEMAIL FOR A CALL BACK ? ?

## 2020-09-29 ENCOUNTER — Ambulatory Visit (INDEPENDENT_AMBULATORY_CARE_PROVIDER_SITE_OTHER): Payer: Medicare Other | Admitting: Family

## 2020-09-29 ENCOUNTER — Encounter: Payer: Self-pay | Admitting: Family

## 2020-09-29 ENCOUNTER — Other Ambulatory Visit: Payer: Self-pay

## 2020-09-29 VITALS — BP 118/70 | HR 60 | Temp 98.2°F | Ht 60.0 in | Wt 151.2 lb

## 2020-09-29 DIAGNOSIS — I10 Essential (primary) hypertension: Secondary | ICD-10-CM | POA: Diagnosis not present

## 2020-09-29 DIAGNOSIS — Z23 Encounter for immunization: Secondary | ICD-10-CM | POA: Diagnosis not present

## 2020-09-29 DIAGNOSIS — N6002 Solitary cyst of left breast: Secondary | ICD-10-CM | POA: Diagnosis not present

## 2020-09-29 DIAGNOSIS — E785 Hyperlipidemia, unspecified: Secondary | ICD-10-CM | POA: Diagnosis not present

## 2020-09-29 DIAGNOSIS — N189 Chronic kidney disease, unspecified: Secondary | ICD-10-CM | POA: Diagnosis not present

## 2020-09-29 DIAGNOSIS — F411 Generalized anxiety disorder: Secondary | ICD-10-CM

## 2020-09-29 DIAGNOSIS — R5383 Other fatigue: Secondary | ICD-10-CM

## 2020-09-29 DIAGNOSIS — M810 Age-related osteoporosis without current pathological fracture: Secondary | ICD-10-CM | POA: Diagnosis not present

## 2020-09-29 LAB — CBC WITH DIFFERENTIAL/PLATELET
Basophils Absolute: 0 10*3/uL (ref 0.0–0.1)
Basophils Relative: 0.8 % (ref 0.0–3.0)
Eosinophils Absolute: 0.2 10*3/uL (ref 0.0–0.7)
Eosinophils Relative: 4.1 % (ref 0.0–5.0)
HCT: 38.6 % (ref 36.0–46.0)
Hemoglobin: 12.6 g/dL (ref 12.0–15.0)
Lymphocytes Relative: 22.1 % (ref 12.0–46.0)
Lymphs Abs: 1.3 10*3/uL (ref 0.7–4.0)
MCHC: 32.8 g/dL (ref 30.0–36.0)
MCV: 87.6 fl (ref 78.0–100.0)
Monocytes Absolute: 0.6 10*3/uL (ref 0.1–1.0)
Monocytes Relative: 9.6 % (ref 3.0–12.0)
Neutro Abs: 3.7 10*3/uL (ref 1.4–7.7)
Neutrophils Relative %: 63.4 % (ref 43.0–77.0)
Platelets: 256 10*3/uL (ref 150.0–400.0)
RBC: 4.41 Mil/uL (ref 3.87–5.11)
RDW: 14.9 % (ref 11.5–15.5)
WBC: 5.9 10*3/uL (ref 4.0–10.5)

## 2020-09-29 LAB — COMPREHENSIVE METABOLIC PANEL
ALT: 18 U/L (ref 0–35)
AST: 22 U/L (ref 0–37)
Albumin: 3.9 g/dL (ref 3.5–5.2)
Alkaline Phosphatase: 91 U/L (ref 39–117)
BUN: 19 mg/dL (ref 6–23)
CO2: 29 mEq/L (ref 19–32)
Calcium: 9.9 mg/dL (ref 8.4–10.5)
Chloride: 102 mEq/L (ref 96–112)
Creatinine, Ser: 1.05 mg/dL (ref 0.40–1.20)
GFR: 50.65 mL/min — ABNORMAL LOW (ref >60.00)
Glucose, Bld: 80 mg/dL (ref 70–99)
Potassium: 4.3 mEq/L (ref 3.5–5.1)
Sodium: 138 mEq/L (ref 135–145)
Total Bilirubin: 0.4 mg/dL (ref 0.2–1.2)
Total Protein: 6.4 g/dL (ref 6.0–8.3)

## 2020-09-29 LAB — LIPID PANEL
Cholesterol: 148 mg/dL (ref 0–200)
HDL: 51.3 mg/dL (ref >39.00)
LDL Cholesterol: 74 mg/dL (ref 0–99)
NonHDL: 96.61
Total CHOL/HDL Ratio: 3
Triglycerides: 113 mg/dL (ref 0.0–149.0)
VLDL: 22.6 mg/dL (ref 0.0–40.0)

## 2020-09-29 LAB — VITAMIN D 25 HYDROXY (VIT D DEFICIENCY, FRACTURES): VITD: 19.22 ng/mL — ABNORMAL LOW (ref 30.00–100.00)

## 2020-09-29 LAB — HEMOGLOBIN A1C: Hgb A1c MFr Bld: 5.7 % (ref 4.6–6.5)

## 2020-09-29 LAB — TSH: TSH: 2.1 u[IU]/mL (ref 0.35–5.50)

## 2020-09-29 NOTE — Assessment & Plan Note (Signed)
Chronic, stable.  Continue potassium chloride 10 meq QOD and triamterene HCTZ 37.5- 25, metoprolol '25mg'$ 

## 2020-09-29 NOTE — Assessment & Plan Note (Signed)
Chronic, stable.  Continue Celexa 10 mg

## 2020-09-29 NOTE — Assessment & Plan Note (Signed)
Patient declines treatment for osteoporosis at this time.  Discussed optimization of calcium and vitamin D and I provided her with appropriate doses on her after visit summary.  I encouraged her to start a more regular walking program.  We will repeat the density next year.

## 2020-09-29 NOTE — Assessment & Plan Note (Signed)
Anticipate controlled.  Pending lipid panel.  Continue atorvastatin 10 mg

## 2020-09-29 NOTE — Patient Instructions (Addendum)
Diagnostic bilateral mammogram and left breast ultrasound is scheduled   For bone health, you need adequate vitamin D, and I recommend you supplement as it is harder to do so with diet alone. I recommend cholecalciferol 800 units daily.  Also, please ensure you are following a diet high in calcium -- research shows better outcomes with dietary sources including kale, yogurt, broccolii, cheese, okra, almonds- to name a few.     Also remember that exercise is a great medicine for maintain and preserve bone health. Advise moderate exercise for 30 minutes , 3 times per week.   Nice to see you!

## 2020-09-29 NOTE — Progress Notes (Signed)
Subjective:    Patient ID: Christie Stevenson, female    DOB: 1940-11-11, 80 y.o.   MRN: [id number]  CC: Christie Stevenson is a 80 y.o. female who presents today for follow up.   HPI: Feels well today.  No complaints   CKD, proteinuria-following with nephrology, Dr. Candiss Norse.  Last seen in February of this year  HLD- compliant with atorvastatin '10mg'$   Hypertension-compliant with  potassium chloride 10 meq QOD  and triamterene HCTZ 37.5- 25 , metoprolol '25mg'$ .  At home, [phone]. No cp, sob  GAD-compliant with Celexa.  Feels well on this dose.  Osteoporosis-she decided not to start Prolia.  She would like to monitor osteoporosis for now. Compliant with calcium and vitamin D. She will walk when weather is nice for 10 minutes , couple times per week.      HISTORY:  Past Medical History:  Diagnosis Date   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ease    Chronic kidney disease    STAGE 3   Depression    GERD (gastroesophageal reflux disease)    History of colon polyps    History of diverticulitis    History of kidney stones    Hyperlipidemia    Hypertension    Migraine    Osteoporosis    Past Surgical History:  Procedure Laterality Date   CATARACT EXTRACTION Left 2014   CATARACT EXTRACTION W/PHACO Right 04/01/2015   Procedure: CATARACT EXTRACTION PHACO AND INTRAOCULAR LENS PLACEMENT ([place]);  Surgeon: [name], MD;  Location: [facility] ORS;  Service: Ophthalmology;  Laterality: Right;  [place] 00:47 AP% 20.8 CDE 9.76 fluid pack lot # [id number]   COLONOSCOPY     COLONOSCOPY WITH PROPOFOL N/A 05/26/2015   Procedure: COLONOSCOPY WITH PROPOFOL;  Surgeon: [name], MD;  Location: [facility] ENDOSCOPY;  Service: Endoscopy;  Laterality: N/A;   TUBAL LIGATION     Family History  Problem Relation Age of Onset   Arthritis Mother    Hypertension Mother    Osteoporosis Mother    Osteoporosis Sister    Osteoporosis Brother    Heart disease Brother 49       CABG and stents   Diabetes  Daughter    Diabetes Son    Cancer Paternal Uncle        Prostate Cancer   Cancer Sister        Breast Cancer    Osteoporosis Sister    Breast cancer Sister        mid 42's   Osteoporosis Brother    Heart attack Brother 38   Arrhythmia Brother        s/p Industrial/product designer   Diabetes Daughter     Allergies: Fosamax [alendronate] Current Outpatient Medications on File Prior to Visit  Medication Sig Dispense Refill   Apoaequorin 10 MG CAPS Take by mouth.      atorvastatin (LIPITOR) 40 MG tablet TAKE 1 TABLET BY MOUTH EVERY DAY 90 tablet 1   citalopram (CELEXA) 10 MG tablet TAKE 1 TABLET BY MOUTH EVERY DAY 90 tablet 2   latanoprost (XALATAN) 0.005 % ophthalmic solution Place 1 drop into the right eye at bedtime.     metoprolol succinate (TOPROL-XL) 25 MG 24 hr tablet TAKE 1 TABLET BY MOUTH EVERY DAY 90 tablet 1   Multiple Vitamins-Minerals (MULTIPLE VITAMINS/WOMENS) tablet Take 1 tablet by mouth daily.     omeprazole (PRILOSEC) 20 MG capsule Take 20 mg by mouth daily.     potassium chloride (KLOR-CON) 10 MEQ tablet  TAKE 1 TABLET BY MOUTH EVERY OTHER DAY. 180 tablet 1   triamterene-hydrochlorothiazide (MAXZIDE-25) 37.5-25 MG tablet TAKE 1 TABLET BY MOUTH EVERY DAY 90 tablet 1   No current facility-administered medications on file prior to visit.    Social History   Tobacco Use   Smoking status: Former    Packs/day: 1.00    Years: 40.00    Pack years: 40.00    Types: Cigarettes    Quit date: 07/22/1994    Years since quitting: 26.2   Smokeless tobacco: Never  Vaping Use   Vaping Use: Never used  Substance Use Topics   Alcohol use: No    Comment: 1 glass of wine on holidays.   Drug use: No    Review of Systems  Constitutional:  Negative for chills and fever.  Respiratory:  Negative for cough.   Cardiovascular:  Negative for chest pain and palpitations.  Gastrointestinal:  Negative for nausea and vomiting.     Objective:    BP 118/70   Pulse 60   Temp 98.2 F (36.8 C)  (Oral)   Ht 5' (1.524 m)   Wt 151 lb 3.2 oz (68.6 kg)   SpO2 96%   BMI 29.53 kg/m  BP Readings from Last 3 Encounters:  09/29/20 118/70  09/05/20 128/71  04/23/20 122/82   Wt Readings from Last 3 Encounters:  09/29/20 151 lb 3.2 oz (68.6 kg)  09/05/20 148 lb 12.8 oz (67.5 kg)  11/12/19 142 lb 9.6 oz (64.7 kg)    Physical Exam Vitals reviewed.  Constitutional:      Appearance: She is well-developed.  Eyes:     Conjunctiva/sclera: Conjunctivae normal.  Cardiovascular:     Rate and Rhythm: Normal rate and regular rhythm.     Pulses: Normal pulses.     Heart sounds: Normal heart sounds.  Pulmonary:     Effort: Pulmonary effort is normal.     Breath sounds: Normal breath sounds. No wheezing, rhonchi or rales.  Skin:    General: Skin is warm and dry.  Neurological:     Mental Status: She is alert.  Psychiatric:        Speech: Speech normal.        Behavior: Behavior normal.        Thought Content: Thought content normal.       Assessment & Plan:   Problem List Items Addressed This Visit       Cardiovascular and Mediastinum   Essential hypertension    Chronic, stable.  Continue potassium chloride 10 meq QOD  and triamterene HCTZ 37.5- 25 , metoprolol '25mg'$         Musculoskeletal and Integument   Osteoporosis - Primary    Patient declines treatment for osteoporosis at this time.  Discussed optimization of calcium and vitamin D and I provided her with appropriate doses on her after visit summary.  I encouraged her to start a more regular walking program.  We will repeat the density next year.      Relevant Orders   VITAMIN D 25 Hydroxy (Vit-D Deficiency, Fractures)     Genitourinary   Chronic kidney disease   Relevant Orders   Hemoglobin A1c   CBC with Differential/Platelet   Comprehensive metabolic panel     Other   GAD (generalized anxiety disorder)    Chronic, stable.  Continue Celexa 10 mg      Hyperlipidemia LDL goal <70    Anticipate controlled.   Pending lipid panel.  Continue atorvastatin 10  mg      Relevant Orders   Lipid panel   Other fatigue   Relevant Orders   TSH   Other Visit Diagnoses     Need for immunization against influenza       Relevant Orders   Flu Vaccine QUAD High Dose(Fluad) (Completed)   Breast cyst, left       Relevant Orders   US BREAST LTD UNI LEFT INC AXILLA   MM DIAG BREAST TOMO BILATERAL        I have discontinued Darylene Price. Crisostomo's baclofen. I am also having her maintain her latanoprost, omeprazole, Multiple Vitamins/Womens, Apoaequorin, potassium chloride, atorvastatin, metoprolol succinate, triamterene-hydrochlorothiazide, and citalopram.   No orders of the defined types were placed in this encounter.   Return precautions given.   Risks, benefits, and alternatives of the medications and treatment plan prescribed today were discussed, and patient expressed understanding.   Education regarding symptom management and diagnosis given to patient on AVS.  Continue to follow with Burnard Hawthorne, FNP for routine health maintenance.   Christie Stevenson and I agreed with plan.   [name], FNP

## 2020-10-07 ENCOUNTER — Other Ambulatory Visit: Payer: Self-pay | Admitting: Family

## 2020-10-07 ENCOUNTER — Ambulatory Visit
Admission: RE | Admit: 2020-10-07 | Discharge: 2020-10-07 | Disposition: A | Discharge status: Home / Self Care | Payer: Medicare Other | Source: Ambulatory Visit | Attending: Family | Admitting: Family

## 2020-10-07 ENCOUNTER — Other Ambulatory Visit: Payer: Self-pay

## 2020-10-07 DIAGNOSIS — N6002 Solitary cyst of left breast: Secondary | ICD-10-CM | POA: Diagnosis not present

## 2020-10-07 DIAGNOSIS — R922 Inconclusive mammogram: Secondary | ICD-10-CM | POA: Diagnosis not present

## 2020-10-07 DIAGNOSIS — N6320 Unspecified lump in the left breast, unspecified quadrant: Secondary | ICD-10-CM

## 2020-12-10 DIAGNOSIS — H40053 Ocular hypertension, bilateral: Secondary | ICD-10-CM | POA: Diagnosis not present

## 2021-02-05 ENCOUNTER — Other Ambulatory Visit: Payer: Self-pay | Admitting: Family

## 2021-02-13 ENCOUNTER — Other Ambulatory Visit: Payer: Self-pay | Admitting: Internal Medicine

## 2021-03-09 DIAGNOSIS — R809 Proteinuria, unspecified: Secondary | ICD-10-CM | POA: Diagnosis not present

## 2021-03-09 DIAGNOSIS — I10 Essential (primary) hypertension: Secondary | ICD-10-CM | POA: Diagnosis not present

## 2021-03-09 DIAGNOSIS — N1831 Chronic kidney disease, stage 3a: Secondary | ICD-10-CM | POA: Diagnosis not present

## 2021-03-30 ENCOUNTER — Telehealth: Payer: Self-pay | Admitting: Family

## 2021-03-30 ENCOUNTER — Ambulatory Visit (INDEPENDENT_AMBULATORY_CARE_PROVIDER_SITE_OTHER): Payer: Medicare Other | Admitting: Family

## 2021-03-30 ENCOUNTER — Other Ambulatory Visit: Payer: Self-pay

## 2021-03-30 ENCOUNTER — Encounter: Payer: Self-pay | Admitting: Family

## 2021-03-30 VITALS — BP 122/78 | HR 78 | Temp 97.8°F | Ht 60.0 in | Wt 156.8 lb

## 2021-03-30 DIAGNOSIS — I10 Essential (primary) hypertension: Secondary | ICD-10-CM | POA: Diagnosis not present

## 2021-03-30 DIAGNOSIS — I6523 Occlusion and stenosis of bilateral carotid arteries: Secondary | ICD-10-CM | POA: Diagnosis not present

## 2021-03-30 DIAGNOSIS — I700 Atherosclerosis of aorta: Secondary | ICD-10-CM | POA: Diagnosis not present

## 2021-03-30 DIAGNOSIS — N6002 Solitary cyst of left breast: Secondary | ICD-10-CM

## 2021-03-30 NOTE — Assessment & Plan Note (Signed)
Chronic, stable. Continue metoprolol succinate 25 mg daily, triamterene hydrochlorothiazide 37.5-25 mg 

## 2021-03-30 NOTE — Telephone Encounter (Signed)
Lft pt daughter a vm about pt being due for a diag mammo and [place] with number to call and sch. thanks ?

## 2021-03-30 NOTE — Assessment & Plan Note (Signed)
Reviewed mammogram from 10/07/20 with patient  today and advised  interval follow-up which was due last month.  She declines diagnostic imaging for left breast distortion or mass.  She would like to stay on regular mammogram schedule with next mammogram due in September 2023. ?

## 2021-03-30 NOTE — Assessment & Plan Note (Signed)
Symptomatically stable.  She is overdue for follow-up for surveillance with vascular.  Referral has been placed to Dr Trula Slade.  ?

## 2021-03-30 NOTE — Assessment & Plan Note (Addendum)
Chronic, symptomatically stable. LDL 74.   Continue Lipitor 40 mg. ?

## 2021-03-30 NOTE — Patient Instructions (Addendum)
Referral to vascular for follow carotid stenosis ? ?Let us know if you dont hear back within a week in regards to an appointment being scheduled.  ? ?Please call  and schedule your 3D mammogram and /or bone density scan as we discussed due 09/2021 ? ? [facility]  ( new location in 2023) ? ?[address], [place], [place] [address] ? ?[place], [place]  ?[phone] ? ? ? ?

## 2021-03-30 NOTE — Progress Notes (Signed)
? ?Subjective:  ? ? Patient ID: Christie Stevenson, female    DOB: 1940/07/07, 81 y.o.   MRN: [id number] ? ?CC: [name] is a 81 y.o. female who presents today for follow up.  ? ?HPI: Feels well today ?No new complaints ? ? ? ?Hypertension-compliant with metoprolol succinate 25 mg daily, triamterene hydrochlorothiazide 37.5-25 mg. No cp, sob.  ?GAD-compliant with Celexa 10 mg and feels well on this dose.  ? ?Atherosclerosis, hyperlipidemia-compliant with Lipitor 40 mg daily ? ?Carotid atherosclerosis-previously following Dr Trula Slade. Denies dizziness. ? ?mammogram 10/07/2020 she has stable benign-appearing left breast mass.  Questionable left lower breast distortion.  Recommendations left diagnostic mammogram with left breast ultrasound in 6 months ? ?HISTORY:  ?Past Medical History:  ?Diagnosis Date  ? 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ease   ? Chronic kidney disease   ? STAGE 3  ? Depression   ? GERD (gastroesophageal reflux disease)   ? History of colon polyps   ? History of diverticulitis   ? History of kidney stones   ? Hyperlipidemia   ? Hypertension   ? Migraine   ? Osteoporosis   ? ?Past Surgical History:  ?Procedure Laterality Date  ? CATARACT EXTRACTION Left 2014  ? CATARACT EXTRACTION W/PHACO Right 04/01/2015  ? Procedure: CATARACT EXTRACTION PHACO AND INTRAOCULAR LENS PLACEMENT ([facility]);  Surgeon: [name], MD;  Location: [facility] ORS;  Service: Ophthalmology;  Laterality: Right;  [place] 00:47 ?AP% 20.8 ?CDE 9.76 ?fluid pack lot # [id number]  ? COLONOSCOPY    ? COLONOSCOPY WITH PROPOFOL N/A 05/26/2015  ? Procedure: COLONOSCOPY WITH PROPOFOL;  Surgeon: [name], MD;  Location: [facility] ENDOSCOPY;  Service: Endoscopy;  Laterality: N/A;  ? TUBAL LIGATION    ? ?Family History  ?Problem Relation Age of Onset  ? Arthritis Mother   ? Hypertension Mother   ? Osteoporosis Mother   ? Osteoporosis Sister   ? Osteoporosis Brother   ? Heart disease Brother 40  ?     CABG and stents  ? Diabetes Daughter   ?  Diabetes Son   ? Cancer Paternal Uncle   ?     Prostate Cancer  ? Cancer Sister   ?     Breast Cancer   ? Osteoporosis Sister   ? Breast cancer Sister   ?     mid 72's  ? Osteoporosis Brother   ? Heart attack Brother 73  ? Arrhythmia Brother   ?     s/p pacermaker  ? Diabetes Daughter   ? ? ?Allergies: Fosamax [alendronate] ?Current Outpatient Medications on File Prior to Visit  ?Medication Sig Dispense Refill  ? Apoaequorin 10 MG CAPS Take by mouth.     ? atorvastatin (LIPITOR) 40 MG tablet TAKE 1 TABLET BY MOUTH EVERY DAY 90 tablet 1  ? citalopram (CELEXA) 10 MG tablet TAKE 1 TABLET BY MOUTH EVERY DAY 90 tablet 2  ? latanoprost (XALATAN) 0.005 % ophthalmic solution Place 1 drop into the right eye at bedtime.    ? metoprolol succinate (TOPROL-XL) 25 MG 24 hr tablet TAKE 1 TABLET BY MOUTH EVERY DAY 90 tablet 1  ? Multiple Vitamins-Minerals (MULTIPLE VITAMINS/WOMENS) tablet Take 1 tablet by mouth daily.    ? omeprazole (PRILOSEC) 20 MG capsule Take 20 mg by mouth daily.    ? potassium chloride (KLOR-CON) 10 MEQ tablet TAKE 1 TABLET BY MOUTH EVERY OTHER DAY. 180 tablet 1  ? triamterene-hydrochlorothiazide (MAXZIDE-25) 37.5-25 MG tablet TAKE 1 TABLET BY MOUTH EVERY  DAY 90 tablet 1  ? ?No current facility-administered medications on file prior to visit.  ? ? ?Social History  ? ?Tobacco Use  ? Smoking status: Former  ?  Packs/day: 1.00  ?  Years: 40.00  ?  Pack years: 40.00  ?  Types: Cigarettes  ?  Quit date: 07/22/1994  ?  Years since quitting: 26.7  ? Smokeless tobacco: Never  ?Vaping Use  ? Vaping Use: Never used  ?Substance Use Topics  ? Alcohol use: No  ?  Comment: 1 glass of wine on holidays.  ? Drug use: No  ? ? ?Review of Systems  ?Constitutional:  Negative for chills, fever and unexpected weight change.  ?HENT:  Negative for congestion.   ?Respiratory:  Negative for cough.   ?Cardiovascular:  Negative for chest pain, palpitations and leg swelling.  ?Gastrointestinal:  Negative for nausea and vomiting.   ?Musculoskeletal:  Negative for arthralgias and myalgias.  ?Skin:  Negative for rash.  ?Neurological:  Negative for headaches.  ?Hematological:  Negative for adenopathy.  ?Psychiatric/Behavioral:  Negative for confusion.   ?   ?Objective:  ?  ?BP 122/78 (BP Location: Left Arm, Patient Position: Sitting, Cuff Size: Normal)   Pulse 78   Temp 97.8 ?F (36.6 ?C) (Oral)   Ht 5' (1.524 m)   Wt 156 lb 12.8 oz (71.1 kg)   SpO2 98%   BMI 30.62 kg/m?  ?BP Readings from Last 3 Encounters:  ?03/30/21 122/78  ?09/29/20 118/70  ?09/05/20 128/71  ? ?Wt Readings from Last 3 Encounters:  ?03/30/21 156 lb 12.8 oz (71.1 kg)  ?09/29/20 151 lb 3.2 oz (68.6 kg)  ?09/05/20 148 lb 12.8 oz (67.5 kg)  ? ? ?Physical Exam ?Vitals reviewed.  ?Constitutional:   ?   Appearance: She is well-developed.  ?Eyes:  ?   Conjunctiva/sclera: Conjunctivae normal.  ?Cardiovascular:  ?   Rate and Rhythm: Normal rate and regular rhythm.  ?   Pulses: Normal pulses.  ?   Heart sounds: Normal heart sounds.  ?Pulmonary:  ?   Effort: Pulmonary effort is normal.  ?   Breath sounds: Normal breath sounds. No wheezing, rhonchi or rales.  ?Skin: ?   General: Skin is warm and dry.  ?Neurological:  ?   Mental Status: She is alert.  ?Psychiatric:     ?   Speech: Speech normal.     ?   Behavior: Behavior normal.     ?   Thought Content: Thought content normal.  ? ? ?   ?Assessment & Plan:  ? ?Problem List Items Addressed This Visit   ? ?  ? Cardiovascular and Mediastinum  ? Atherosclerosis of aorta ([place])  ?  Chronic, symptomatically stable. LDL 74.   Continue Lipitor 40 mg. ?  ?  ? Carotid stenosis - Primary  ?  Symptomatically stable.  She is overdue for follow-up for surveillance with vascular.  Referral has been placed to Dr Trula Slade.  ?  ?  ? Relevant Orders  ? Ambulatory referral to Vascular Surgery  ? Essential hypertension  ?  Chronic, stable.  Continue metoprolol succinate 25 mg daily, triamterene hydrochlorothiazide 37.5-25 mg ?  ?  ?  ? Other  ? Benign cyst  of left breast  ?  Reviewed mammogram from 10/07/20 with patient  today and advised  interval follow-up which was due last month.  She declines diagnostic imaging for left breast distortion or mass.  She would like to stay on regular mammogram schedule with next  mammogram due in September 2023. ?  ?  ? ?Call to GI to confirm is repeat colonoscopy is due ? ?I am having Darylene Price. [place] maintain her latanoprost, omeprazole, Multiple Vitamins/Womens, Apoaequorin, potassium chloride, triamterene-hydrochlorothiazide, citalopram, metoprolol succinate, and atorvastatin. ? ? ?No orders of the defined types were placed in this encounter. ? ? ?Return precautions given.  ? ?Risks, benefits, and alternatives of the medications and treatment plan prescribed today were discussed, and patient expressed understanding.  ? ?Education regarding symptom management and diagnosis given to patient on AVS. ? ?Continue to follow with Burnard Hawthorne, FNP for routine health maintenance.  ? ?Susa Raring and I agreed with plan.  ? ?[name], FNP ? ? ?

## 2021-03-30 NOTE — Telephone Encounter (Signed)
Call [facility] ?I wanted to double check that she doesn't need to come back for colonoscopy ? ?She would be due this year , previous polyp on last colonoscopy 05/2015 ?She is however 81 yo ? ?Please let me know ? ?

## 2021-04-01 NOTE — Telephone Encounter (Signed)
close

## 2021-04-02 NOTE — Telephone Encounter (Signed)
Pt actually has an appointment May 2nd with Dr. Alice Reichert to discuss & f/u. ?

## 2021-04-03 NOTE — Telephone Encounter (Signed)
noted 

## 2021-05-06 ENCOUNTER — Other Ambulatory Visit: Payer: Self-pay | Admitting: Family

## 2021-05-06 DIAGNOSIS — I10 Essential (primary) hypertension: Secondary | ICD-10-CM

## 2021-05-11 ENCOUNTER — Other Ambulatory Visit: Payer: Self-pay | Admitting: *Deleted

## 2021-05-11 DIAGNOSIS — I6529 Occlusion and stenosis of unspecified carotid artery: Secondary | ICD-10-CM

## 2021-05-25 ENCOUNTER — Ambulatory Visit: Payer: Medicare Other | Admitting: Surgery

## 2021-05-25 ENCOUNTER — Ambulatory Visit (HOSPITAL_COMMUNITY)
Admission: RE | Admit: 2021-05-25 | Discharge: 2021-05-25 | Disposition: A | Discharge status: Home / Self Care | Payer: Medicare Other | Source: Ambulatory Visit | Attending: Surgery | Admitting: Surgery

## 2021-05-25 ENCOUNTER — Encounter: Payer: Self-pay | Admitting: Surgery

## 2021-05-25 VITALS — BP 159/76 | HR 52 | Temp 98.6°F | Resp 20 | Ht 60.0 in | Wt 160.0 lb

## 2021-05-25 DIAGNOSIS — I6529 Occlusion and stenosis of unspecified carotid artery: Secondary | ICD-10-CM | POA: Insufficient documentation

## 2021-05-25 DIAGNOSIS — I6521 Occlusion and stenosis of right carotid artery: Secondary | ICD-10-CM

## 2021-05-25 NOTE — Progress Notes (Signed)
? ?[facility] ? ?Patient name: Christie Stevenson MRN: [id number] DOB: February 07, 1940 Sex: female ? ? ?REASON FOR VISIT:  ? ? ?Follow up ? ?HISOTRY OF PRESENT ILLNESS:  ? ? ?Christie Stevenson is a 81 y.o. female who I last saw in 2021 for carotid stenosis.  She had pretty been evaluated at [facility] for a 40 to 59% right-sided stenosis which was asymptomatic.  Ultrasound in our office showed similar 40-59% right-sided stenosis and no significant left-sided stenosis.  She remained asymptomatic and I had recommended follow-up in 1 year.  She states that she has not had any changes over the past year.  She denies any neurologic symptoms such as numbness weakness in either extremity slurred speech, or amaurosis fugax.  She denies claudication. ? ?She suffers from stage III chronic renal insufficiency.  She is medically managed for hypertension.  She takes a statin for hypercholesterolemia.  She does not take an aspirin as she states that she bleeds very easily. ?  ?PAST MEDICAL HISTORY:  ? ?Past Medical History:  ?Diagnosis Date  ? 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ease   ? Carotid artery occlusion   ? Chronic kidney disease   ? STAGE 3  ? Depression   ? GERD (gastroesophageal reflux disease)   ? History of colon polyps   ? History of diverticulitis   ? History of kidney stones   ? Hyperlipidemia   ? Hypertension   ? Migraine   ? Osteoporosis   ? ? ? ?FAMILY HISTORY:  ? ?Family History  ?Problem Relation Age of Onset  ? Arthritis Mother   ? Hypertension Mother   ? Osteoporosis Mother   ? Osteoporosis Sister   ? Osteoporosis Brother   ? Heart disease Brother 21  ?     CABG and stents  ? Diabetes Daughter   ? Diabetes Son   ? Cancer Paternal Uncle   ?     Prostate Cancer  ? Cancer Sister   ?     Breast Cancer   ? Osteoporosis Sister   ? Breast cancer Sister   ?     mid 52's  ? Osteoporosis Brother   ? Heart attack Brother 29  ? Arrhythmia Brother   ?      s/p pacermaker  ? Diabetes Daughter   ? ? ?SOCIAL HISTORY:  ? ?Social History  ? ?Tobacco Use  ? Smoking status: Former  ?  Packs/day: 1.00  ?  Years: 40.00  ?  Pack years: 40.00  ?  Types: Cigarettes  ?  Quit date: 07/22/1994  ?  Years since quitting: 26.8  ? Smokeless tobacco: Never  ?Substance Use Topics  ? Alcohol use: No  ?  Comment: 1 glass of wine on holidays.  ? ? ? ?ALLERGIES:  ? ?Allergies  ?Allergen Reactions  ? Fosamax [Alendronate]   ?  Chest pain  ? ? ? ?CURRENT MEDICATIONS:  ? ?Current Outpatient Medications  ?Medication Sig Dispense Refill  ? Apoaequorin 10 MG CAPS Take by mouth.     ? atorvastatin (LIPITOR) 40 MG tablet TAKE 1 TABLET BY MOUTH EVERY DAY 90 tablet 1  ? citalopram (CELEXA) 10 MG tablet TAKE 1 TABLET BY MOUTH EVERY DAY 90 tablet 2  ? latanoprost (XALATAN) 0.005 % ophthalmic solution Place 1 drop into the right eye at bedtime.    ? metoprolol succinate (TOPROL-XL) 25 MG 24 hr tablet TAKE 1 TABLET BY MOUTH EVERY DAY 90 tablet 1  ?  Multiple Vitamins-Minerals (MULTIPLE VITAMINS/WOMENS) tablet Take 1 tablet by mouth daily.    ? omeprazole (PRILOSEC) 20 MG capsule Take 20 mg by mouth daily.    ? potassium chloride (KLOR-CON) 10 MEQ tablet TAKE 1 TABLET BY MOUTH EVERY OTHER DAY. 180 tablet 1  ? triamterene-hydrochlorothiazide (MAXZIDE-25) 37.5-25 MG tablet TAKE 1 TABLET BY MOUTH EVERY DAY 90 tablet 1  ? ?No current facility-administered medications for this visit.  ? ? ?REVIEW OF SYSTEMS:  ? ?'[X]'$  denotes positive finding, '[ ]'$  denotes negative finding ?Cardiac  Comments:  ?Chest pain or chest pressure:    ?Shortness of breath upon exertion:    ?Short of breath when lying flat:    ?Irregular heart rhythm:    ?    ?Vascular    ?Pain in calf, thigh, or hip brought on by ambulation:    ?Pain in feet at night that wakes you up from your sleep:     ?Blood clot in your veins:    ?Leg swelling:     ?    ?Pulmonary    ?Oxygen at home:    ?Productive cough:     ?Wheezing:     ?    ?Neurologic    ?Sudden  weakness in arms or legs:     ?Sudden numbness in arms or legs:     ?Sudden onset of difficulty speaking or slurred speech:    ?Temporary loss of vision in one eye:     ?Problems with dizziness:     ?    ?Gastrointestinal    ?Blood in stool:     ?Vomited blood:     ?    ?Genitourinary    ?Burning when urinating:     ?Blood in urine:    ?    ?Psychiatric    ?Major depression:     ?    ?Hematologic    ?Bleeding problems:    ?Problems with blood clotting too easily:    ?    ?Skin    ?Rashes or ulcers:    ?    ?Constitutional    ?Fever or chills:    ? ? ?PHYSICAL EXAM:  ? ?Vitals:  ? 05/25/21 1131 05/25/21 1132  ?BP: (!) 155/67 (!) 159/76  ?Pulse: (!) 52   ?Resp: 20   ?Temp: 98.6 ?F (37 ?C)   ?SpO2: 95%   ?Weight: 160 lb (72.6 kg)   ?Height: 5' (1.524 m)   ? ? ?GENERAL: The patient is a well-nourished female, in no acute distress. The vital signs are documented above. ?CARDIAC: There is a regular rate and rhythm.  ?VASCULAR: Palpable pedal pulses ?PULMONARY: Non-labored respirations ?MUSCULOSKELETAL: There are no major deformities or cyanosis. ?NEUROLOGIC: No focal weakness or paresthesias are detected. ?SKIN: There are no ulcers or rashes noted. ?PSYCHIATRIC: The patient has a normal affect. ? ?STUDIES:  ? ?I have reviewed the following: ?Right Carotid: Velocities in the right ICA are consistent with a 40-59%  ?               stenosis.  ? ?Left Carotid: Velocities in the left ICA are consistent with a 1-39%  ?stenosis.  ? ?Vertebrals:  Bilateral vertebral arteries demonstrate antegrade flow.  ?Subclavians: Normal flow hemodynamics were seen in bilateral subclavian  ?             arteries. ? ?MEDICAL ISSUES:  ? ?Carotid: No significant change in her right carotid stenosis which is 40-59%.  She will continue on medical therapy which includes her  statin and blood pressure medication.  She does not take aspirin because of the fact that she bleeds very easily.  She will return in 1 year for surveillance ultrasound.  She  knows to contact me should she develop any symptoms. ? ? ? ?Annamarie Major, IV, MD, FACS ?[facility] ?Tel [phone] ?Pager [phone]  ?

## 2021-07-29 DIAGNOSIS — Z86010 Personal history of colonic polyps: Secondary | ICD-10-CM | POA: Diagnosis not present

## 2021-08-06 DIAGNOSIS — H35373 Puckering of macula, bilateral: Secondary | ICD-10-CM | POA: Diagnosis not present

## 2021-08-06 DIAGNOSIS — H26492 Other secondary cataract, left eye: Secondary | ICD-10-CM | POA: Diagnosis not present

## 2021-08-14 ENCOUNTER — Other Ambulatory Visit: Payer: Self-pay | Admitting: Family

## 2021-08-19 DIAGNOSIS — Z0100 Encounter for examination of eyes and vision without abnormal findings: Secondary | ICD-10-CM | POA: Diagnosis not present

## 2021-09-08 ENCOUNTER — Telehealth: Payer: Self-pay

## 2021-09-08 ENCOUNTER — Ambulatory Visit (INDEPENDENT_AMBULATORY_CARE_PROVIDER_SITE_OTHER): Payer: Medicare Other

## 2021-09-08 VITALS — Ht 60.0 in | Wt 160.0 lb

## 2021-09-08 DIAGNOSIS — Z0000 Encounter for general adult medical examination without abnormal findings: Secondary | ICD-10-CM

## 2021-09-08 NOTE — Telephone Encounter (Signed)
Patient reports she is agreeable to receiving the Prolia injection.

## 2021-09-08 NOTE — Patient Instructions (Addendum)
  Christie Stevenson , Thank you for taking time to come for your Medicare Wellness Visit. I appreciate your ongoing commitment to your health goals. Please review the following plan we discussed and let me know if I can assist you in the future.   These are the goals we discussed:  Goals      Maintain Healthy Lifestyle     Stay active Healthy diet        This is a list of the screening recommended for you and due dates:  Health Maintenance  Topic Date Due   Flu Shot  08/18/2021   Colon Cancer Screening  09/18/2021*   COVID-19 Vaccine (5 - Pfizer risk series) 09/24/2021*   Tetanus Vaccine  11/18/2021*   Zoster (Shingles) Vaccine (1 of 2) 12/09/2021*   Pneumonia Vaccine  Completed   DEXA scan (bone density measurement)  Completed   HPV Vaccine  Aged Out  *Topic was postponed. The date shown is not the original due date.

## 2021-09-08 NOTE — Progress Notes (Signed)
Subjective:   Christie Stevenson is a 81 y.o. female who presents for Medicare Annual (Subsequent) preventive examination.  Review of Systems    No ROS.  Medicare Wellness Virtual Visit.  Visual/audio telehealth visit, UTA vital signs.   See social history for additional risk factors.   Cardiac Risk Factors include: advanced age (>63mn, >>61women);hypertension     Objective:    Today's Vitals   09/08/21 1250  Weight: 160 lb (72.6 kg)  Height: 5' (1.524 m)   Body mass index is 31.25 kg/m.     09/08/2021   12:58 PM 09/05/2020    1:41 PM 09/05/2019    1:21 PM 02/22/2017    1:05 PM 02/20/2016    1:15 PM 05/26/2015    2:30 PM 02/19/2015   11:08 AM  Advanced Directives  Does Patient Have a Medical Advance Directive? Yes Yes Yes No No No No  Type of [place] Healthcare Power of [place]      Does patient want to make changes to medical advance directive? No - Patient declined  No - Patient declined      Copy of HSylviain Chart? No - copy requested No - copy requested No - copy requested      Would patient like information on creating a medical advance directive?    Yes (MAU/Ambulatory/Procedural Areas - Information given) No - Patient declined No - patient declined information Yes - Educational materials given    Current Medications (verified) Outpatient Encounter Medications as of 09/08/2021  Medication Sig   Apoaequorin 10 MG CAPS Take by mouth.    atorvastatin (LIPITOR) 40 MG tablet TAKE 1 TABLET BY MOUTH EVERY DAY   citalopram (CELEXA) 10 MG tablet TAKE 1 TABLET BY MOUTH EVERY DAY   latanoprost (XALATAN) 0.005 % ophthalmic solution Place 1 drop into the right eye at bedtime.   metoprolol succinate (TOPROL-XL) 25 MG 24 hr tablet TAKE 1 TABLET BY MOUTH EVERY DAY   Multiple Vitamins-Minerals (MULTIPLE VITAMINS/WOMENS) tablet Take 1 tablet by mouth daily.   omeprazole  (PRILOSEC) 20 MG capsule Take 20 mg by mouth daily.   potassium chloride (KLOR-CON) 10 MEQ tablet TAKE 1 TABLET BY MOUTH EVERY OTHER DAY.   triamterene-hydrochlorothiazide (MAXZIDE-25) 37.5-25 MG tablet TAKE 1 TABLET BY MOUTH EVERY DAY   No facility-administered encounter medications on file as of 09/08/2021.    Allergies (verified) Fosamax [alendronate]   History: Past Medical History:  Diagnosis Date   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ease    Carotid artery occlusion    Chronic kidney disease    STAGE 3   Depression    GERD (gastroesophageal reflux disease)    History of colon polyps    History of diverticulitis    History of kidney stones    Hyperlipidemia    Hypertension    Migraine    Osteoporosis    Past Surgical History:  Procedure Laterality Date   CATARACT EXTRACTION Left 2014   CATARACT EXTRACTION W/PHACO Right 04/01/2015   Procedure: CATARACT EXTRACTION PHACO AND INTRAOCULAR LENS PLACEMENT ([place];  Surgeon: WBirder Robson MD;  Location: [facility] ORS;  Service: Ophthalmology;  Laterality: Right;  [place]:47 AP% 20.8 CDE 9.76 fluid pack lot # [id number]   COLONOSCOPY     COLONOSCOPY WITH PROPOFOL N/A 05/26/2015   Procedure: COLONOSCOPY WITH PROPOFOL;  Surgeon: RManya Silvas MD;  [facility];  Service: Endoscopy;  Laterality: N/A;  TUBAL LIGATION     Family History  Problem Relation Age of Onset   Arthritis Mother    Hypertension Mother    Osteoporosis Mother    Osteoporosis Sister    Cancer Sister        Breast Cancer    Osteoporosis Sister    Breast cancer Sister        mid 55's   Osteoporosis Brother    Heart disease Brother 53       CABG and stents   Osteoporosis Brother    Heart attack Brother 8   Arrhythmia Brother        s/p Industrial/product designer   Diabetes Daughter    Diabetes Daughter    Diabetes Son    Cancer Paternal Uncle        Prostate Cancer   Social History   Socioeconomic History   Marital status: Divorced     Spouse name: Not on file   Number of children: 5   Years of education: 12   Highest education level: Not on file  Occupational History   Occupation: Accounting work    Comment: Retired  Tobacco Use   Smoking status: Former    Packs/day: 1.00    Years: 40.00    Total pack years: 40.00    Types: Cigarettes    Quit date: 07/22/1994    Years since quitting: 27.1   Smokeless tobacco: Never  Vaping Use   Vaping Use: Never used  Substance and Sexual Activity   Alcohol use: No    Comment: 1 glass of wine on holidays.   Drug use: No   Sexual activity: Never  Other Topics Concern   Not on file  Social History Narrative   Caffeine - 2 cups of coffee   Exercise - none regularly   Diet - tries to eat as healthy as possible but can do better      Does not have a Living Will         Social Determinants of Health   Financial Resource Strain: Low Risk  (09/08/2021)   Overall Financial Resource Strain (CARDIA)    Difficulty of Paying Living Expenses: Not hard at all  Food Insecurity: No Food Insecurity (09/08/2021)   Hunger Vital Sign    Worried About Running Out of Food in the Last Year: Never true    Ran Out of Food in the Last Year: Never true  Transportation Needs: No Transportation Needs (09/08/2021)   PRAPARE - Hydrologist (Medical): No    Lack of Transportation (Non-Medical): No  Physical Activity: Insufficiently Active (09/08/2021)   Exercise Vital Sign    Days of Exercise per Week: 7 days    Minutes of Exercise per Session: 10 min  Stress: No Stress Concern Present (09/08/2021)   [place]    Feeling of Stress : Not at all  Social Connections: Unknown (09/08/2021)   Social Connection and Isolation Panel [NHANES]    Frequency of Communication with Friends and Family: More than three times a week    Frequency of Social Gatherings with Friends and Family: More than three times a week     Attends Religious Services: Not on file    Active Member of Clubs or Organizations: Not on file    Attends Archivist Meetings: Not on file    Marital Status: Not on file    Tobacco Counseling Counseling given: Not Answered  Clinical Intake:  Pre-visit preparation completed: Yes        Diabetes: No  How often do you need to have someone help you when you read instructions, pamphlets, or other written materials from your doctor or pharmacy?: 1 - Never    Interpreter Needed?: No    Activities of Daily Living    09/08/2021   12:53 PM  In your present state of health, do you have any difficulty performing the following activities:  Hearing? 1  Comment Hearing aids  Vision? 0  Difficulty concentrating or making decisions? 0  Walking or climbing stairs? 0  Dressing or bathing? 0  Doing errands, shopping? 0  Preparing Food and eating ? N  Using the Toilet? N  In the past six months, have you accidently leaked urine? Y  Comment Managed with daily pad  Do you have problems with loss of bowel control? N  Managing your Medications? N  Managing your Finances? N  Housekeeping or managing your Housekeeping? N   Patient Care Team: Burnard Hawthorne, FNP as PCP - General (Family Medicine)  Indicate any recent Medical Services you may have received from other than [facility] providers in the past year (date may be approximate).     Assessment:   This is a routine wellness examination for [facility].  Virtual Visit via Telephone Note  I connected with  Susa Raring on 09/08/21 at 12:30 PM EDT by telephone and verified that I am speaking with the correct person using two identifiers.  Location: Patient: home Provider: office Persons participating in the virtual visit: patient/Nurse Health Advisor   I discussed the limitations of performing an evaluation and management service by telehealth. We continued and completed visit with audio only. Some vital signs may be  absent or patient reported.   Hearing/Vision screen Hearing Screening - Comments:: Hearing aids Vision Screening - Comments:: Followed by [facility]  Wears corrective lenses  Visits every six months  Cataract extraction, bilateral  They have regular follow up with the ophthalmologist  Dietary issues and exercise activities discussed: Current Exercise Habits: Home exercise routine, Type of exercise: walking, Time (Minutes): 10, Frequency (Times/Week): 7, Weekly Exercise (Minutes/Week): 70, Intensity: Mild Regular diet Good water intake   Goals Addressed             This Visit's Progress    Maintain Healthy Lifestyle       Stay active Healthy diet       Depression Screen    09/08/2021   12:53 PM 09/29/2020   11:44 AM 09/29/2020   11:09 AM 09/05/2020    1:30 PM 09/05/2019    1:13 PM 06/25/2019   11:09 AM 11/09/2018    7:46 AM  PHQ 2/9 Scores  PHQ - 2 Score 0 0 0 0 0 0 6  PHQ- 9 Score  0    0 9    Fall Risk    09/08/2021   12:53 PM 03/30/2021   11:07 AM 09/05/2020    1:42 PM 09/05/2019    1:22 PM 06/25/2019   11:08 AM  [place] in the past year? 0 0 0 0 0  Number falls in past yr: 0 0  0 0  Injury with Fall?  0   0  Risk for fall due to :  No Fall Risks     Follow up Falls evaluation completed Falls evaluation completed Falls evaluation completed Falls evaluation completed Falls evaluation completed    FALL  RISK PREVENTION PERTAINING TO THE HOME:  Any stairs in or around the home? No  Home free of loose throw rugs in walkways, pet beds, electrical cords, etc? Yes  Adequate lighting in your home to reduce risk of falls? Yes   ASSISTIVE DEVICES UTILIZED TO PREVENT FALLS: Life alert? No  Use of a cane, walker or w/c? No   TIMED UP AND GO: Was the test performed? No .   Cognitive Function: Patient is alert and oriented x3.      02/19/2015   11:22 AM  MMSE - Mini Mental State Exam  Orientation to time 5  Orientation to Place 5  Registration 3   Attention/ Calculation 5  Recall 3  Language- name 2 objects 2  Language- repeat 1  Language- follow 3 step command 3  Language- read & follow direction 1  Write a sentence 1  Copy design 1  Total score 30        09/08/2021    1:03 PM 09/05/2019    1:29 PM 02/22/2017    1:39 PM  6CIT Screen  What Year?  0 points 0 points  What month?  0 points 0 points  What time?   0 points  Count back from 20   0 points  Months in reverse 0 points 0 points 0 points  Repeat phrase  0 points 0 points  Total Score   0 points    Immunizations Immunization History  Administered Date(s) Administered   Fluad Quad(high Dose 65+) 10/17/2019, 09/29/2020   Influenza Split 10/19/2013   Influenza, High Dose Seasonal PF 10/15/2016, 09/26/2017, 10/11/2018   Influenza-Unspecified 12/02/2012, 10/19/2015, 10/26/2016, 10/19/2018   PFIZER(Purple Top)SARS-COV-2 Vaccination 02/22/2019, 03/15/2019, 12/20/2019, 05/08/2020   Pneumococcal Conjugate-13 12/02/2012   Pneumococcal Polysaccharide-23 03/17/2015    TDAP status: Due, Education has been provided regarding the importance of this vaccine. Advised may receive this vaccine at local pharmacy or Health Dept. Aware to provide a copy of the vaccination record if obtained from local pharmacy or Health Dept. Verbalized acceptance and understanding.   Shingrix Completed?: No.    Education has been provided regarding the importance of this vaccine. Patient has been advised to call insurance company to determine out of pocket expense if they have not yet received this vaccine. Advised may also receive vaccine at local pharmacy or Health Dept. Verbalized acceptance and understanding.  Screening Tests Health Maintenance  Topic Date Due   INFLUENZA VACCINE  08/18/2021   COLONOSCOPY (Pts 45-64yr Insurance coverage will need to be confirmed)  09/18/2021 (Originally 05/25/2020)   COVID-19 Vaccine (5 - Pfizer risk series) 09/24/2021 (Originally 07/03/2020)   TETANUS/TDAP   11/18/2021 (Originally 07/24/1959)   Zoster Vaccines- Shingrix (1 of 2) 12/09/2021 (Originally 07/24/1959)   Pneumonia Vaccine 80 Years old  Completed   DEXA SCAN  Completed   HPV VACCINES  Aged Out   Health Maintenance Health Maintenance Due  Topic Date Due   INFLUENZA VACCINE  08/18/2021   Lung Cancer Screening: (Low Dose CT Chest recommended if Age 80-80years, 30 pack-year currently smoking OR have quit w/in 15years.) does not qualify.   Hepatitis C Screening: does not qualify  Vision Screening: Recommended annual ophthalmology exams for early detection of glaucoma and other disorders of the eye.  Dental Screening: Recommended annual dental exams for proper oral hygiene  Community Resource Referral / Chronic Care Management: CRR required this visit?  No   CCM required this visit?  No      Plan:  I have personally reviewed and noted the following in the patient's chart:   Medical and social history Use of alcohol, tobacco or illicit drugs  Current medications and supplements including opioid prescriptions. Patient is not currently taking opioid prescriptions. Functional ability and status Nutritional status Physical activity Advanced directives List of other physicians Hospitalizations, surgeries, and ER visits in previous 12 months Vitals Screenings to include cognitive, depression, and falls Referrals and appointments  In addition, I have reviewed and discussed with patient certain preventive protocols, quality metrics, and best practice recommendations. A written personalized care plan for preventive services as well as general preventive health recommendations were provided to patient.     Varney Biles, LPN   [id number]

## 2021-09-10 NOTE — Telephone Encounter (Signed)
Call patient Please set up visit to discuss osteoporosis and treatment.  She indicated to Denisa that she be willing to start Prolia.    Please order same labs from 09/29/20 and sch 2-3 days ahead of visit so we can review  She also needs updated bone density Please order and give her # to schedule

## 2021-09-14 ENCOUNTER — Other Ambulatory Visit: Payer: Self-pay

## 2021-09-14 DIAGNOSIS — E785 Hyperlipidemia, unspecified: Secondary | ICD-10-CM

## 2021-09-14 DIAGNOSIS — I10 Essential (primary) hypertension: Secondary | ICD-10-CM

## 2021-09-14 DIAGNOSIS — R5383 Other fatigue: Secondary | ICD-10-CM

## 2021-09-14 NOTE — Progress Notes (Signed)
Orders only

## 2021-09-14 NOTE — Telephone Encounter (Signed)
Called and spoke to patient in regards to her Osteoperosis  and treatment and to inquire about her starting Prolia. Labs are already ordered and patient is aware. Scheduled office visit and lab appt

## 2021-09-15 ENCOUNTER — Other Ambulatory Visit (INDEPENDENT_AMBULATORY_CARE_PROVIDER_SITE_OTHER): Payer: Medicare Other

## 2021-09-15 DIAGNOSIS — R5383 Other fatigue: Secondary | ICD-10-CM | POA: Diagnosis not present

## 2021-09-15 DIAGNOSIS — I10 Essential (primary) hypertension: Secondary | ICD-10-CM

## 2021-09-15 DIAGNOSIS — E785 Hyperlipidemia, unspecified: Secondary | ICD-10-CM | POA: Diagnosis not present

## 2021-09-15 LAB — CBC WITH DIFFERENTIAL/PLATELET
Basophils Absolute: 0.1 10*3/uL (ref 0.0–0.1)
Basophils Relative: 0.9 % (ref 0.0–3.0)
Eosinophils Absolute: 0.2 10*3/uL (ref 0.0–0.7)
Eosinophils Relative: 3.9 % (ref 0.0–5.0)
HCT: 40 % (ref 36.0–46.0)
Hemoglobin: 13.1 g/dL (ref 12.0–15.0)
Lymphocytes Relative: 28.7 % (ref 12.0–46.0)
Lymphs Abs: 1.7 10*3/uL (ref 0.7–4.0)
MCHC: 32.7 g/dL (ref 30.0–36.0)
MCV: 86.5 fl (ref 78.0–100.0)
Monocytes Absolute: 0.5 10*3/uL (ref 0.1–1.0)
Monocytes Relative: 7.8 % (ref 3.0–12.0)
Neutro Abs: 3.6 10*3/uL (ref 1.4–7.7)
Neutrophils Relative %: 58.7 % (ref 43.0–77.0)
Platelets: 271 10*3/uL (ref 150.0–400.0)
RBC: 4.63 Mil/uL (ref 3.87–5.11)
RDW: 15.3 % (ref 11.5–15.5)
WBC: 6.1 10*3/uL (ref 4.0–10.5)

## 2021-09-15 LAB — COMPREHENSIVE METABOLIC PANEL
ALT: 14 U/L (ref 0–35)
AST: 20 U/L (ref 0–37)
Albumin: 4 g/dL (ref 3.5–5.2)
Alkaline Phosphatase: 100 U/L (ref 39–117)
BUN: 14 mg/dL (ref 6–23)
CO2: 28 mEq/L (ref 19–32)
Calcium: 10.1 mg/dL (ref 8.4–10.5)
Chloride: 102 mEq/L (ref 96–112)
Creatinine, Ser: 1.29 mg/dL — ABNORMAL HIGH (ref 0.40–1.20)
GFR: 39.3 mL/min — ABNORMAL LOW (ref >60.00)
Glucose, Bld: 82 mg/dL (ref 70–99)
Potassium: 3.7 mEq/L (ref 3.5–5.1)
Sodium: 140 mEq/L (ref 135–145)
Total Bilirubin: 0.7 mg/dL (ref 0.2–1.2)
Total Protein: 7 g/dL (ref 6.0–8.3)

## 2021-09-15 LAB — LIPID PANEL
Cholesterol: 170 mg/dL (ref 0–200)
HDL: 52.2 mg/dL (ref >39.00)
LDL Cholesterol: 85 mg/dL (ref 0–99)
NonHDL: 118.23
Total CHOL/HDL Ratio: 3
Triglycerides: 165 mg/dL — ABNORMAL HIGH (ref 0.0–149.0)
VLDL: 33 mg/dL (ref 0.0–40.0)

## 2021-09-15 LAB — TSH: TSH: 1.96 u[IU]/mL (ref 0.35–5.50)

## 2021-09-15 LAB — VITAMIN D 25 HYDROXY (VIT D DEFICIENCY, FRACTURES): VITD: 24.57 ng/mL — ABNORMAL LOW (ref 30.00–100.00)

## 2021-09-15 LAB — HEMOGLOBIN A1C: Hgb A1c MFr Bld: 5.8 % (ref 4.6–6.5)

## 2021-09-17 ENCOUNTER — Other Ambulatory Visit: Payer: Self-pay | Admitting: Family

## 2021-09-17 DIAGNOSIS — F32A Depression, unspecified: Secondary | ICD-10-CM

## 2021-09-22 ENCOUNTER — Ambulatory Visit (INDEPENDENT_AMBULATORY_CARE_PROVIDER_SITE_OTHER): Payer: Medicare Other | Admitting: Family

## 2021-09-22 ENCOUNTER — Encounter: Payer: Self-pay | Admitting: Family

## 2021-09-22 ENCOUNTER — Telehealth: Payer: Self-pay | Admitting: Family

## 2021-09-22 VITALS — BP 136/80 | HR 55 | Temp 98.2°F | Ht 60.0 in | Wt 162.8 lb

## 2021-09-22 DIAGNOSIS — M810 Age-related osteoporosis without current pathological fracture: Secondary | ICD-10-CM | POA: Diagnosis not present

## 2021-09-22 DIAGNOSIS — I10 Essential (primary) hypertension: Secondary | ICD-10-CM | POA: Diagnosis not present

## 2021-09-22 DIAGNOSIS — Z0000 Encounter for general adult medical examination without abnormal findings: Secondary | ICD-10-CM | POA: Diagnosis not present

## 2021-09-22 DIAGNOSIS — Z1211 Encounter for screening for malignant neoplasm of colon: Secondary | ICD-10-CM | POA: Diagnosis not present

## 2021-09-22 DIAGNOSIS — Z1231 Encounter for screening mammogram for malignant neoplasm of breast: Secondary | ICD-10-CM | POA: Diagnosis not present

## 2021-09-22 NOTE — Patient Instructions (Addendum)
Please have tetanus vaccine (Tdap) at local pharmacy    please call  and schedule your 3D mammogram and /or bone density scan as we discussed.   [facility]  ( new location in 2023)  [address], [place], [place] [address]  [place], [place]  We will work to get Prolia approved through insurance.   We discussed the risk Prolia including the risk of vertebral, hip nonvertebral fractures.  Prolia also has the risk of potential skin events including rash, eczema as well as recurrence of infection, some serious. Other potential risks  include risk of osteonecrosis of the jaw. Please let me know of ANY planned dental procedure for this reason.  If you decide to come off this medication, there is risk of rapid bone loss and fracture, therefore prior to discontinuing or missing a dose, please let me know so we can make a plan for another agent.   We want you to optimize vitamin d and calcium. Please follow the below doses.   For post menopausal women, guidelines recommend a diet with 1200 mg of Calcium per day. If you are eating calcium rich foods, you do not need a calcium supplement. The body better absorbs the calcium that you eat over supplementation. If you do supplement, I recommend not supplementing the full 1200 mg/ day as this can lead to increased risk of cardiovascular disease. I recommend Calcium Citrate over the counter, and you may take a total of 600 to 800 mg per day in divided doses with meals for best absorption.   For bone health, you need adequate vitamin D, and I recommend you supplement as it is harder to do so with diet alone. I recommend cholecalciferol 800 units daily.  Also, please ensure you are following a diet high in calcium -- research shows better outcomes with dietary sources including kale, yogurt, broccolii, cheese, okra, almonds- to name a few.    Also remember that exercise is a great medicine for maintain and preserve bone  health. Advise moderate exercise for 30 minutes , 3 times per week.     Denosumab injection What is this medicine? DENOSUMAB (den oh sue mab) slows bone breakdown. Prolia is used to treat osteoporosis in women after menopause and in men, and in people who are taking corticosteroids for 6 months or more. Delton See is used to treat a high calcium level due to cancer and to prevent bone fractures and other bone problems caused by multiple myeloma or cancer bone metastases. Delton See is also used to treat giant cell tumor of the bone. This medicine may be used for other purposes; ask your health care provider or pharmacist if you have questions. COMMON BRAND NAME(S): Prolia, XGEVA What should I tell my health care provider before I take this medicine? They need to know if you have any of these conditions: dental disease having surgery or tooth extraction infection kidney disease low levels of calcium or Vitamin D in the blood malnutrition on hemodialysis skin conditions or sensitivity thyroid or parathyroid disease an unusual reaction to denosumab, other medicines, foods, dyes, or preservatives pregnant or trying to get pregnant breast-feeding How should I use this medicine? This medicine is for injection under the skin. It is given by a health care professional in a hospital or clinic setting. A special MedGuide will be given to you before each treatment. Be sure to read this information carefully each time. For Prolia, talk to your pediatrician regarding the use of  this medicine in children. Special care may be needed. For Delton See, talk to your pediatrician regarding the use of this medicine in children. While this drug may be prescribed for children as young as 13 years for selected conditions, precautions do apply. Overdosage: If you think you have taken too much of this medicine contact a poison control center or emergency room at once. NOTE: This medicine is only for you. Do not share this  medicine with others. What if I miss a dose? It is important not to miss your dose. Call your doctor or health care professional if you are unable to keep an appointment. What may interact with this medicine? Do not take this medicine with any of the following medications: other medicines containing denosumab This medicine may also interact with the following medications: medicines that lower your chance of fighting infection steroid medicines like prednisone or cortisone This list may not describe all possible interactions. Give your health care provider a list of all the medicines, herbs, non-prescription drugs, or dietary supplements you use. Also tell them if you smoke, drink alcohol, or use illegal drugs. Some items may interact with your medicine. What should I watch for while using this medicine? Visit your doctor or health care professional for regular checks on your progress. Your doctor or health care professional may order blood tests and other tests to see how you are doing. Call your doctor or health care professional for advice if you get a fever, chills or sore throat, or other symptoms of a cold or flu. Do not treat yourself. This drug may decrease your body's ability to fight infection. Try to avoid being around people who are sick. You should make sure you get enough calcium and vitamin D while you are taking this medicine, unless your doctor tells you not to. Discuss the foods you eat and the vitamins you take with your health care professional. See your dentist regularly. Brush and floss your teeth as directed. Before you have any dental work done, tell your dentist you are receiving this medicine. Do not become pregnant while taking this medicine or for 5 months after stopping it. Talk with your doctor or health care professional about your birth control options while taking this medicine. Women should inform their doctor if they wish to become pregnant or think they might be  pregnant. There is a potential for serious side effects to an unborn child. Talk to your health care professional or pharmacist for more information. What side effects may I notice from receiving this medicine? Side effects that you should report to your doctor or health care professional as soon as possible: allergic reactions like skin rash, itching or hives, swelling of the face, lips, or tongue bone pain breathing problems dizziness jaw pain, especially after dental work redness, blistering, peeling of the skin signs and symptoms of infection like fever or chills; cough; sore throat; pain or trouble passing urine signs of low calcium like fast heartbeat, muscle cramps or muscle pain; pain, tingling, numbness in the hands or feet; seizures unusual bleeding or bruising unusually weak or tired Side effects that usually do not require medical attention (report to your doctor or health care professional if they continue or are bothersome): constipation diarrhea headache joint pain loss of appetite muscle pain runny nose tiredness upset stomach This list may not describe all possible side effects. Call your doctor for medical advice about side effects. You may report side effects to FDA at 1-800-FDA-1088. Where should I keep  my medicine? This medicine is only given in a clinic, doctor's office, or other health care setting and will not be stored at home. NOTE: This sheet is a summary. It may not cover all possible information. If you have questions about this medicine, talk to your doctor, pharmacist, or health care provider.  2020 Elsevier/Gold Standard (2017-05-13 16:10:44)

## 2021-09-22 NOTE — Telephone Encounter (Signed)
Pt would like to start prolia  Can we get approved for her ?

## 2021-09-22 NOTE — Progress Notes (Signed)
Subjective:    Patient ID: Christie Stevenson, female    DOB: 01/23/1940, 81 y.o.   MRN: [id number]  CC: Christie Stevenson is a 81 y.o. female who presents today for physical exam.    HPI: Feels well today No concerns  No new skin lesions She would like to start prolia. No oral surgery planned. She is taking vit D and calcium. She is working in yard and in the home.    Carotid stenosis - follow up with Dr Trula Slade 05/2021  HTN- compliant with metoprolol succinate 25 mg daily, triamterene hydrochlorothiazide 37.5-25 mg. No cp, sob  Colorectal Cancer Screening: due ; unable to results from 05/2015. Scheduled with [facility] . She declines referral.  Breast Cancer Screening: Mammogram over due Cervical Cancer Screening:no longer screening  Bone Health screening/DEXA for 65+: history of osteoporosis, last bone density 07/30/2019.  Due  Lung Cancer Screening: Doesn't have 20 year pack year history and age > 66 years yo 73 years        Tetanus - due Labs: Screening labs done prior  Alcohol use:  very occasional Smoking/tobacco use: former smoker.     HISTORY:  Past Medical History:  Diagnosis Date  . 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ease   . Carotid artery occlusion   . Chronic kidney disease    STAGE 3  . Depression   . GERD (gastroesophageal reflux disease)   . History of colon polyps   . History of diverticulitis   . History of kidney stones   . Hyperlipidemia   . Hypertension   . Migraine   . Osteoporosis     Past Surgical History:  Procedure Laterality Date  . CATARACT EXTRACTION Left 2014  . CATARACT EXTRACTION W/PHACO Right 04/01/2015   Procedure: CATARACT EXTRACTION PHACO AND INTRAOCULAR LENS PLACEMENT (IOC);  Surgeon: [name], MD;  Location: [facility] ORS;  Service: Ophthalmology;  Laterality: Right;  [place] 00:47 AP% 20.8 CDE 9.76 fluid pack lot # [id number]  . COLONOSCOPY    . COLONOSCOPY WITH PROPOFOL N/A 05/26/2015   Procedure: COLONOSCOPY WITH  PROPOFOL;  Surgeon: [name], MD;  Location: [facility] ENDOSCOPY;  Service: Endoscopy;  Laterality: N/A;  . TUBAL LIGATION     Family History  Problem Relation Age of Onset  . Arthritis Mother   . Hypertension Mother   . Osteoporosis Mother   . Osteoporosis Sister   . Cancer Sister        Breast Cancer   . Osteoporosis Sister   . Breast cancer Sister        mid 26's  . Osteoporosis Brother   . Heart disease Brother 71       CABG and stents  . Osteoporosis Brother   . Heart attack Brother 34  . Arrhythmia Brother        s/p pacermaker  . Diabetes Daughter   . Diabetes Daughter   . Diabetes Son   . Cancer Paternal Uncle        Prostate Cancer      ALLERGIES: Fosamax [alendronate]  Current Outpatient Medications on File Prior to Visit  Medication Sig Dispense Refill  . Apoaequorin 10 MG CAPS Take by mouth.     Marland Kitchen atorvastatin (LIPITOR) 40 MG tablet TAKE 1 TABLET BY MOUTH EVERY DAY 90 tablet 1  . citalopram (CELEXA) 10 MG tablet TAKE 1 TABLET BY MOUTH EVERY DAY 90 tablet 2  . latanoprost (XALATAN) 0.005 % ophthalmic solution Place 1 drop into the  right eye at bedtime.    . metoprolol succinate (TOPROL-XL) 25 MG 24 hr tablet TAKE 1 TABLET BY MOUTH EVERY DAY 90 tablet 1  . Multiple Vitamins-Minerals (MULTIPLE VITAMINS/WOMENS) tablet Take 1 tablet by mouth daily.    Marland Kitchen omeprazole (PRILOSEC) 20 MG capsule Take 20 mg by mouth daily.    . potassium chloride (KLOR-CON) 10 MEQ tablet TAKE 1 TABLET BY MOUTH EVERY OTHER DAY. 180 tablet 1  . triamterene-hydrochlorothiazide (MAXZIDE-25) 37.5-25 MG tablet TAKE 1 TABLET BY MOUTH EVERY DAY 90 tablet 1   No current facility-administered medications on file prior to visit.    Social History   Tobacco Use  . Smoking status: Former    Packs/day: 1.00    Years: 40.00    Total pack years: 40.00    Types: Cigarettes    Quit date: 07/22/1994    Years since quitting: 27.1  . Smokeless tobacco: Never  Vaping Use  . Vaping Use: Never used   Substance Use Topics  . Alcohol use: No    Comment: 1 glass of wine on holidays.  . Drug use: No    Review of Systems  Constitutional:  Negative for chills, fever and unexpected weight change.  HENT:  Negative for congestion.   Respiratory:  Negative for cough.   Cardiovascular:  Negative for chest pain, palpitations and leg swelling.  Gastrointestinal:  Negative for nausea and vomiting.  Musculoskeletal:  Negative for arthralgias and myalgias.  Skin:  Negative for rash.  Neurological:  Negative for headaches.  Hematological:  Negative for adenopathy.  Psychiatric/Behavioral:  Negative for confusion.       Objective:    BP 136/80 (BP Location: Left Arm, Patient Position: Sitting, Cuff Size: Normal)   Pulse (!) 55   Temp 98.2 F (36.8 C) (Oral)   Ht 5' (1.524 m)   Wt 162 lb 12.8 oz (73.8 kg)   SpO2 96%   BMI 31.79 kg/m   BP Readings from Last 3 Encounters:  09/22/21 136/80  05/25/21 (!) 159/76  03/30/21 122/78   Wt Readings from Last 3 Encounters:  09/22/21 162 lb 12.8 oz (73.8 kg)  09/08/21 160 lb (72.6 kg)  05/25/21 160 lb (72.6 kg)    Physical Exam Vitals reviewed.  Constitutional:      Appearance: She is well-developed.  Eyes:     Conjunctiva/sclera: Conjunctivae normal.  Neck:     Thyroid: No thyroid mass or thyromegaly.  Cardiovascular:     Rate and Rhythm: Normal rate and regular rhythm.     Pulses: Normal pulses.     Heart sounds: Normal heart sounds.  Pulmonary:     Effort: Pulmonary effort is normal.     Breath sounds: Normal breath sounds. No wheezing, rhonchi or rales.  Lymphadenopathy:     Head:     Right side of head: No submental, submandibular, tonsillar, preauricular, posterior auricular or occipital adenopathy.     Left side of head: No submental, submandibular, tonsillar, preauricular, posterior auricular or occipital adenopathy.     Cervical: No cervical adenopathy.  Skin:    General: Skin is warm and dry.  Neurological:     Mental  Status: She is alert.  Psychiatric:        Speech: Speech normal.        Behavior: Behavior normal.        Thought Content: Thought content normal.       Assessment & Plan:   Problem List Items Addressed This Visit  Musculoskeletal and Integument   Osteoporosis - Primary    Need for medical necessity of Prolia assessed and patient continue to meet criteria  Results of previous DEXA show: osteoporosis, last bone density 07/30/2019   h/o CKD.  Normal vitamin d. Normal calcium. Patient confirms taking both calcium and vitamin D.   Discussed the risks of Prolia including the risk of vertebral, hip nonvertebral fractures.  We also discussed the risk of potential skin events including rash, eczema as well as recurrence of infection, some serious. Other potential risks were conveyed including risk of osteonecrosis of the jaw. We also discussed the risk of risk of rapid bone loss and fracture if discontinued or missed/delayed dose, which necessitates not missing/delaying or discontinuing without a plan in place.   We will seek to approve prolia through insurance.   Education provided on AVS as well. All questions answered.  Discussed the risk of benefits of Prolia including the risk of vertebral, hip nonvertebral fractures.  We also discussed the risk of potential skin events including rash, eczema as well as recurrence of infection, some serious. Other potential risks were conveyed including risk of osteonecrosis of the jaw.         Relevant Orders   DG Bone Density     Other   Routine general medical examination at a health care facility    Patient declines clinical breast exam.  She will continue self breast exam at home.  Mammogram and bone density are scheduled.  She is due for left breast diagnostic imaging.  Colonoscopy is scheduled.  She declines follow-up with dermatology at this time.  Encouraged continued active lifestyle house and yard work      Screening for breast  cancer   Relevant Orders   MM DIAG BREAST TOMO BILATERAL   US BREAST LTD UNI LEFT INC AXILLA     I am having Darylene Price. Mahajan maintain her latanoprost, omeprazole, Multiple Vitamins/Womens, Apoaequorin, potassium chloride, triamterene-hydrochlorothiazide, metoprolol succinate, atorvastatin, and citalopram.   No orders of the defined types were placed in this encounter.   Return precautions given.   Risks, benefits, and alternatives of the medications and treatment plan prescribed today were discussed, and patient expressed understanding.   Education regarding symptom management and diagnosis given to patient on AVS.   Continue to follow with Burnard Hawthorne, FNP for routine health maintenance.   Christie Stevenson and I agreed with plan.   [name], FNP

## 2021-09-22 NOTE — Assessment & Plan Note (Signed)
Chronic, stable. Continue metoprolol succinate 25 mg daily, triamterene hydrochlorothiazide 37.5-25 mg

## 2021-09-22 NOTE — Assessment & Plan Note (Signed)
Need for medical necessity of Prolia assessed and patient meets criteria  Results of previous DEXA show: osteoporosis, last bone density 07/30/2019   h/o CKD.  Normal vitamin d. Normal calcium. Patient confirms taking both calcium and vitamin D.   Discussed the risks of Prolia including the risk of vertebral, hip nonvertebral fractures.  We also discussed the risk of potential skin events including rash, eczema as well as recurrence of infection, some serious. Other potential risks were conveyed including risk of osteonecrosis of the jaw. We also discussed the risk of risk of rapid bone loss and fracture if discontinued or missed/delayed dose, which necessitates not missing/delaying or discontinuing without a plan in place.   We will seek to approve prolia through insurance.   Education provided on AVS as well. All questions answered.  Discussed the risk of benefits of Prolia including the risk of vertebral, hip nonvertebral fractures.  We also discussed the risk of potential skin events including rash, eczema as well as recurrence of infection, some serious. Other potential risks were conveyed including risk of osteonecrosis of the jaw.

## 2021-09-22 NOTE — Assessment & Plan Note (Signed)
Patient declines clinical breast exam.  She will continue self breast exam at home.  Mammogram and bone density are scheduled.  She is due for left breast diagnostic imaging.  Colonoscopy is scheduled.  She declines follow-up with dermatology at this time.  Encouraged continued active lifestyle house and yard work

## 2021-09-28 NOTE — Telephone Encounter (Signed)
noted 

## 2021-09-28 NOTE — Telephone Encounter (Signed)
Submitted Information for benefit verification through the [employer] portal.

## 2021-09-29 ENCOUNTER — Other Ambulatory Visit: Payer: Self-pay

## 2021-09-29 DIAGNOSIS — M810 Age-related osteoporosis without current pathological fracture: Secondary | ICD-10-CM

## 2021-10-18 NOTE — Telephone Encounter (Signed)
$  306 due; PA required Sent mychart message to patient prior to proceeding with PA

## 2021-10-22 NOTE — Telephone Encounter (Signed)
Spoke with patient & discussed amount due for Prolia. Pt has declined at this time due to the $306 cost.  I notified patient that I would let PCP know in case any alternatives or additional suggestions are available.

## 2021-10-26 ENCOUNTER — Ambulatory Visit
Admission: RE | Admit: 2021-10-26 | Discharge: 2021-10-26 | Disposition: A | Discharge status: Home / Self Care | Payer: Medicare Other | Attending: Gastroenterology | Admitting: Gastroenterology

## 2021-10-26 ENCOUNTER — Ambulatory Visit: Payer: Medicare Other | Admitting: Certified Registered"

## 2021-10-26 ENCOUNTER — Other Ambulatory Visit: Payer: Self-pay

## 2021-10-26 ENCOUNTER — Encounter: Payer: Self-pay | Admitting: Gastroenterology

## 2021-10-26 ENCOUNTER — Encounter
Admission: RE | Disposition: A | Discharge status: Home / Self Care | Payer: Self-pay | Source: Home / Self Care | Attending: Gastroenterology

## 2021-10-26 DIAGNOSIS — N183 Chronic kidney disease, stage 3 unspecified: Secondary | ICD-10-CM | POA: Insufficient documentation

## 2021-10-26 DIAGNOSIS — D3610 Benign neoplasm of peripheral nerves and autonomic nervous system, unspecified: Secondary | ICD-10-CM | POA: Insufficient documentation

## 2021-10-26 DIAGNOSIS — K635 Polyp of colon: Secondary | ICD-10-CM | POA: Diagnosis not present

## 2021-10-26 DIAGNOSIS — G4730 Sleep apnea, unspecified: Secondary | ICD-10-CM | POA: Diagnosis not present

## 2021-10-26 DIAGNOSIS — K5730 Diverticulosis of large intestine without perforation or abscess without bleeding: Secondary | ICD-10-CM | POA: Diagnosis not present

## 2021-10-26 DIAGNOSIS — Z1211 Encounter for screening for malignant neoplasm of colon: Secondary | ICD-10-CM | POA: Diagnosis not present

## 2021-10-26 DIAGNOSIS — E785 Hyperlipidemia, unspecified: Secondary | ICD-10-CM | POA: Diagnosis not present

## 2021-10-26 DIAGNOSIS — K644 Residual hemorrhoidal skin tags: Secondary | ICD-10-CM | POA: Diagnosis not present

## 2021-10-26 DIAGNOSIS — D125 Benign neoplasm of sigmoid colon: Secondary | ICD-10-CM | POA: Diagnosis not present

## 2021-10-26 DIAGNOSIS — K219 Gastro-esophageal reflux disease without esophagitis: Secondary | ICD-10-CM | POA: Diagnosis not present

## 2021-10-26 DIAGNOSIS — K641 Second degree hemorrhoids: Secondary | ICD-10-CM | POA: Diagnosis not present

## 2021-10-26 DIAGNOSIS — D120 Benign neoplasm of cecum: Secondary | ICD-10-CM | POA: Diagnosis not present

## 2021-10-26 DIAGNOSIS — Z79899 Other long term (current) drug therapy: Secondary | ICD-10-CM | POA: Insufficient documentation

## 2021-10-26 DIAGNOSIS — D122 Benign neoplasm of ascending colon: Secondary | ICD-10-CM | POA: Diagnosis not present

## 2021-10-26 DIAGNOSIS — I129 Hypertensive chronic kidney disease with stage 1 through stage 4 chronic kidney disease, or unspecified chronic kidney disease: Secondary | ICD-10-CM | POA: Diagnosis not present

## 2021-10-26 DIAGNOSIS — F418 Other specified anxiety disorders: Secondary | ICD-10-CM | POA: Diagnosis not present

## 2021-10-26 DIAGNOSIS — Z87891 Personal history of nicotine dependence: Secondary | ICD-10-CM | POA: Diagnosis not present

## 2021-10-26 DIAGNOSIS — N189 Chronic kidney disease, unspecified: Secondary | ICD-10-CM | POA: Diagnosis not present

## 2021-10-26 DIAGNOSIS — K649 Unspecified hemorrhoids: Secondary | ICD-10-CM | POA: Diagnosis not present

## 2021-10-26 DIAGNOSIS — Z86010 Personal history of colonic polyps: Secondary | ICD-10-CM | POA: Diagnosis not present

## 2021-10-26 HISTORY — PX: COLONOSCOPY: SHX5424

## 2021-10-26 SURGERY — COLONOSCOPY
Anesthesia: General

## 2021-10-26 MED ORDER — PROPOFOL 500 MG/50ML IV EMUL: INTRAVENOUS | Status: DC | PRN

## 2021-10-26 MED ORDER — PROPOFOL 500 MG/50ML IV EMUL
INTRAVENOUS | Status: DC | PRN
  Administered 2021-10-26: 100 ug/kg/min via INTRAVENOUS

## 2021-10-26 MED ORDER — PROPOFOL 10 MG/ML IV BOLUS: INTRAVENOUS | Status: DC | PRN

## 2021-10-26 MED ORDER — LIDOCAINE HCL (CARDIAC) PF 100 MG/5ML IV SOSY
PREFILLED_SYRINGE | INTRAVENOUS | Status: DC | PRN
  Administered 2021-10-26: 40 mg via INTRAVENOUS

## 2021-10-26 MED ORDER — SODIUM CHLORIDE 0.9 % IV SOLN: INTRAVENOUS | Status: DC

## 2021-10-26 MED ORDER — PROPOFOL 10 MG/ML IV BOLUS
INTRAVENOUS | Status: DC | PRN
  Administered 2021-10-26 (×2): 40 ug via INTRAVENOUS

## 2021-10-26 NOTE — H&P (Signed)
Pre-Procedure H&P   Patient ID: Christie Stevenson is a 81 y.o. female.  Gastroenterology Provider: Annamaria Helling, DO  Referring Provider: Dawson Bills, NP PCP: Christie Hawthorne, FNP  Date: 10/26/2021  HPI [name] is a 81 y.o. female who presents today for Colonoscopy for surveillance- phx colon polyps. Patient undergoing colonoscopy for surveillance for history of colon polyps.  Last underwent colonoscopy in May 2017 from a positive Cologuard test.  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 The patient has a bowel movement regular without melena or hematochezia.  Hemoglobin is 13.1 MCV 86 platelets 271,000 creatinine 1.3.  Brother-colon cancer in his 30s; mother-colon polyps   Past Medical History:  Diagnosis Date   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ease    Carotid artery occlusion    Chronic kidney disease    STAGE 3   Depression    GERD (gastroesophageal reflux disease)    History of colon polyps    History of diverticulitis    History of kidney stones    Hyperlipidemia    Hypertension    Migraine    Osteoporosis     Past Surgical History:  Procedure Laterality Date   CATARACT EXTRACTION Left 2014   CATARACT EXTRACTION W/PHACO Right 04/01/2015   Procedure: CATARACT EXTRACTION PHACO AND INTRAOCULAR LENS PLACEMENT ([place]);  Surgeon: [name], MD;  Location: [facility] ORS;  Service: Ophthalmology;  Laterality: Right;  [place] 00:47 AP% 20.8 CDE 9.76 fluid pack lot # [id number]   COLONOSCOPY     COLONOSCOPY WITH PROPOFOL N/A 05/26/2015   Procedure: COLONOSCOPY WITH PROPOFOL;  Surgeon: [name], MD;  Location: [facility] ENDOSCOPY;  Service: Endoscopy;  Laterality: N/A;   TUBAL LIGATION      Family History Brother-colon cancer in his 4s; mother-colon polyps No h/o GI disease or malignancy  Review of Systems  Constitutional:  Negative for activity change, appetite change, chills,  diaphoresis, fatigue, fever and unexpected weight change.  HENT:  Negative for trouble swallowing and voice change.   Respiratory:  Negative for shortness of breath and wheezing.   Cardiovascular:  Negative for chest pain, palpitations and leg swelling.  Gastrointestinal:  Negative for abdominal distention, abdominal pain, anal bleeding, blood in stool, constipation, diarrhea, nausea, rectal pain and vomiting.  Musculoskeletal:  Negative for arthralgias and myalgias.  Skin:  Negative for color change and pallor.  Neurological:  Negative for dizziness, syncope and weakness.  Psychiatric/Behavioral:  Negative for confusion.   All other systems reviewed and are negative.    Medications No current facility-administered medications on file prior to encounter.   Current Outpatient Medications on File Prior to Encounter  Medication Sig Dispense Refill   Apoaequorin 10 MG CAPS Take by mouth.      latanoprost (XALATAN) 0.005 % ophthalmic solution Place 1 drop into the right eye at bedtime.     Multiple Vitamins-Minerals (MULTIPLE VITAMINS/WOMENS) tablet Take 1 tablet by mouth daily.     omeprazole (PRILOSEC) 20 MG capsule Take 20 mg by mouth daily.     potassium chloride (KLOR-CON) 10 MEQ tablet TAKE 1 TABLET BY MOUTH EVERY OTHER DAY. 180 tablet 1   triamterene-hydrochlorothiazide (MAXZIDE-25) 37.5-25 MG tablet TAKE 1 TABLET BY MOUTH EVERY DAY 90 tablet 1    Pertinent medications related to GI and procedure were reviewed by me with the patient prior to the procedure   Current Facility-Administered Medications:    0.9 %  sodium chloride infusion, , Intravenous, Continuous, Christie Helling, DO, Continued from Pre-op at 10/26/21 1353  sodium chloride         Allergies  Allergen Reactions   Fosamax [Alendronate]     Chest pain   Allergies were reviewed by me prior to the procedure  Objective   Body mass index is 30.08 kg/m. Vitals:   10/26/21 1245  BP: (!) 156/62  Pulse: 62   Resp: 16  Temp: (!) 97.2 F (36.2 C)  SpO2: 97%  Weight: 69.9 kg  Height: 5' (1.524 m)     Physical Exam Vitals and nursing note reviewed.  Constitutional:      General: She is not in acute distress.    Appearance: Normal appearance. She is not ill-appearing, toxic-appearing or diaphoretic.  HENT:     Head: Normocephalic and atraumatic.     Nose: Nose normal.     Mouth/Throat:     Mouth: Mucous membranes are moist.     Pharynx: Oropharynx is clear.  Eyes:     General: No scleral icterus.    Extraocular Movements: Extraocular movements intact.  Cardiovascular:     Rate and Rhythm: Normal rate and regular rhythm.     Heart sounds: Normal heart sounds. No murmur heard.    No friction rub. No gallop.  Pulmonary:     Effort: Pulmonary effort is normal. No respiratory distress.     Breath sounds: Normal breath sounds. No wheezing, rhonchi or rales.  Abdominal:     General: Bowel sounds are normal. There is no distension.     Palpations: Abdomen is soft.     Tenderness: There is no abdominal tenderness. There is no guarding or rebound.  Musculoskeletal:     Cervical back: Neck supple.     Right lower leg: No edema.     Left lower leg: No edema.  Skin:    General: Skin is warm and dry.     Coloration: Skin is not jaundiced or pale.  Neurological:     General: No focal deficit present.     Mental Status: She is alert and oriented to person, place, and time. Mental status is at baseline.  Psychiatric:        Mood and Affect: Mood normal.        Behavior: Behavior normal.        Thought Content: Thought content normal.        Judgment: Judgment normal.      Assessment:  [name] is a 81 y.o. female  who presents today for Colonoscopy for surveillance- phx colon polyps..  Plan:  Colonoscopy with possible intervention today  Colonoscopy with possible biopsy, control of bleeding, polypectomy, and interventions as necessary has been discussed with the  patient/patient representative. Informed consent was obtained from the patient/patient representative after explaining the indication, nature, and risks of the procedure including but not limited to death, bleeding, perforation, missed neoplasm/lesions, cardiorespiratory compromise, and reaction to medications. Opportunity for questions was given and appropriate answers were provided. Patient/patient representative has verbalized understanding is amenable to undergoing the procedure.   Christie Helling, DO  [facility]  Portions of the record may have been created with voice recognition software. Occasional wrong-word or 'sound-a-like' substitutions may have occurred due to the inherent limitations of voice recognition software.  Read the chart carefully and recognize, using context, where substitutions may have occurred.

## 2021-10-26 NOTE — Anesthesia Preprocedure Evaluation (Signed)
Anesthesia Evaluation  Patient identified by MRN, date of birth, ID band Patient awake    Reviewed: Allergy & Precautions, NPO status , Patient's Chart, lab work & pertinent test results  Airway Mallampati: III  TM Distance: >3 FB Neck ROM: full    Dental  (+) Teeth Intact   Pulmonary neg pulmonary ROS, sleep apnea and Continuous Positive Airway Pressure Ventilation , former smoker,    Pulmonary exam normal breath sounds clear to auscultation       Cardiovascular Exercise Tolerance: Good hypertension, Pt. on medications negative cardio ROS Normal cardiovascular exam+ dysrhythmias  Rhythm:Regular Rate:Normal     Neuro/Psych  Headaches, Anxiety Depression negative neurological ROS  negative psych ROS   GI/Hepatic negative GI ROS, Neg liver ROS, GERD  Medicated,  Endo/Other  negative endocrine ROS  Renal/GU CRFRenal diseasenegative Renal ROS  negative genitourinary   Musculoskeletal   Abdominal Normal abdominal exam  (+)   Peds negative pediatric ROS (+)  Hematology negative hematology ROS (+)   Anesthesia Other Findings Past Medical History: No date: 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ease No date: Carotid artery occlusion No date: Chronic kidney disease     Comment:  STAGE 3 No date: Depression No date: GERD (gastroesophageal reflux disease) No date: History of colon polyps No date: History of diverticulitis No date: History of kidney stones No date: Hyperlipidemia No date: Hypertension No date: Migraine No date: Osteoporosis  Past Surgical History: 2014: CATARACT EXTRACTION; Left 04/01/2015: CATARACT EXTRACTION W/PHACO; Right     Comment:  Procedure: CATARACT EXTRACTION PHACO AND INTRAOCULAR               LENS PLACEMENT (IOC);  Surgeon: [name], MD;                Location: [facility] ORS;  Service: Ophthalmology;  Laterality:              Right;  [place] 00:47 AP% 20.8 CDE 9.76 fluid pack  lot #               [id number] No date: COLONOSCOPY 05/26/2015: COLONOSCOPY WITH PROPOFOL; N/A     Comment:  Procedure: COLONOSCOPY WITH PROPOFOL;  Surgeon: [name], MD;  Location: [facility] ENDOSCOPY;  Service:               Endoscopy;  Laterality: N/A; No date: TUBAL LIGATION  BMI    Body Mass Index: 30.08 kg/m      Reproductive/Obstetrics negative OB ROS                             Anesthesia Physical Anesthesia Plan  ASA: 3  Anesthesia Plan: General   Post-op Pain Management:    Induction: Intravenous  PONV Risk Score and Plan: Propofol infusion and TIVA  Airway Management Planned: Natural Airway  Additional Equipment:   Intra-op Plan:   Post-operative Plan:   Informed Consent: I have reviewed the patients History and Physical, chart, labs and discussed the procedure including the risks, benefits and alternatives for the proposed anesthesia with the patient or authorized representative who has indicated his/her understanding and acceptance.     Dental Advisory Given  Plan Discussed with: CRNA and Surgeon  Anesthesia Plan Comments:         Anesthesia Quick Evaluation

## 2021-10-26 NOTE — Interval H&P Note (Signed)
History and Physical Interval Note: Preprocedure H&P from 10/26/21  was reviewed and there was no interval change after seeing and examining the patient.  Written consent was obtained from the patient after discussion of risks, benefits, and alternatives. Patient has consented to proceed with Colonoscopy with possible intervention   10/26/2021 1:55 PM  Susa Raring  has presented today for surgery, with the diagnosis of Hx of adenomatous colonic polyps (Z86.010).  The various methods of treatment have been discussed with the patient and family. After consideration of risks, benefits and other options for treatment, the patient has consented to  Procedure(s): COLONOSCOPY (N/A) as a surgical intervention.  The patient's history has been reviewed, patient examined, no change in status, stable for surgery.  I have reviewed the patient's chart and labs.  Questions were answered to the patient's satisfaction.     Annamaria Helling

## 2021-10-26 NOTE — Anesthesia Postprocedure Evaluation (Signed)
Anesthesia Post Note  Patient: Christie Stevenson  Procedure(s) Performed: COLONOSCOPY  Patient location during evaluation: PACU Anesthesia Type: General Level of consciousness: awake and oriented Pain management: pain level controlled Vital Signs Assessment: post-procedure vital signs reviewed and stable Respiratory status: spontaneous breathing and nonlabored ventilation Cardiovascular status: stable Anesthetic complications: no   No notable events documented.   Last Vitals:  Vitals:   10/26/21 1435 10/26/21 1445  BP: (!) 129/93 (!) 144/60  Pulse: (!) 59 (!) 55  Resp: (!) 31 (!) 28  Temp:    SpO2: 100% 98%    Last Pain:  Vitals:   10/26/21 1445  TempSrc:   PainSc: 0-No pain                 VAN STAVEREN,Delfina Schreurs

## 2021-10-26 NOTE — Transfer of Care (Signed)
Immediate Anesthesia Transfer of Care Note  Patient: Christie Stevenson  Procedure(s) Performed: COLONOSCOPY  Patient Location: PACU and Endoscopy Unit  Anesthesia Type:General  Level of Consciousness: drowsy  Airway & Oxygen Therapy: Patient Spontanous Breathing  Post-op Assessment: Report given to RN  Post vital signs: stable  Last Vitals:  Vitals Value Taken Time  BP 126/56 10/26/21 1425  Temp    Pulse 59 10/26/21 1425  Resp 20 10/26/21 1425  SpO2 98 % 10/26/21 1425  Vitals shown include unvalidated device data.  Last Pain: There were no vitals filed for this visit.    Patients Stated Pain Goal: 0 (66/59/93 5701)  Complications: No notable events documented.

## 2021-10-26 NOTE — Op Note (Signed)
[facility] Gastroenterology Patient Name: Christie Stevenson Procedure Date: 10/26/2021 1:46 PM MRN: [id number] Account #: [id number] Date of Birth: 1940/10/13 Admit Type: Outpatient Age: 81 Room: [facility] ENDO ROOM 2 Gender: Female Note Status: Finalized Instrument Name: Colonoscope [id number] Procedure:             Colonoscopy Indications:           High risk colon cancer surveillance: Personal history                         of colonic polyps Providers:             Rueben Bash, DO Referring MD:          Yvetta Coder. Arnett (Referring MD) Medicines:             Monitored Anesthesia Care Complications:         No immediate complications. Estimated blood loss:                         Minimal. Procedure:             Pre-Anesthesia Assessment:                        - Prior to the procedure, a History and Physical was                         performed, and patient medications and allergies were                         reviewed. The patient is competent. The risks and                         benefits of the procedure and the sedation options and                         risks were discussed with the patient. All questions                         were answered and informed consent was obtained.                         Patient identification and proposed procedure were                         verified by the physician, the nurse, the anesthetist                         and the technician in the endoscopy suite. Mental                         Status Examination: alert and oriented. Airway                         Examination: normal oropharyngeal airway and neck                         mobility. Respiratory Examination: clear to  auscultation. CV Examination: RRR, no murmurs, no S3                         or S4. Prophylactic Antibiotics: The patient does not                         require prophylactic antibiotics. Prior                          Anticoagulants: The patient has taken no previous                         anticoagulant or antiplatelet agents. ASA Grade                         Assessment: III - A patient with severe systemic                         disease. After reviewing the risks and benefits, the                         patient was deemed in satisfactory condition to                         undergo the procedure. The anesthesia plan was to use                         monitored anesthesia care (MAC). Immediately prior to                         administration of medications, the patient was                         re-assessed for adequacy to receive sedatives. The                         heart rate, respiratory rate, oxygen saturations,                         blood pressure, adequacy of pulmonary ventilation, and                         response to care were monitored throughout the                         procedure. The physical status of the patient was                         re-assessed after the procedure.                        After obtaining informed consent, the colonoscope was                         passed under direct vision. Throughout the procedure,                         the patient's blood pressure, pulse, and oxygen  saturations were monitored continuously. The                         Colonoscope was introduced through the anus and                         advanced to the the terminal ileum, with                         identification of the appendiceal orifice and IC                         valve. The colonoscopy was performed without                         difficulty. The patient tolerated the procedure well.                         The quality of the bowel preparation was evaluated                         using the BBPS [facility] Bowel Preparation Scale) with                         scores of: Right Colon = 3, Transverse Colon = 3 and                         Left Colon = 3  (entire mucosa seen well with no                         residual staining, small fragments of stool or opaque                         liquid). The total BBPS score equals 9. The terminal                         ileum, ileocecal valve, appendiceal orifice, and                         rectum were photographed. Findings:      Skin tags were found on perianal exam.      The digital rectal exam was normal. Pertinent negatives include normal       sphincter tone.      Multiple small-mouthed diverticula were found in the entire colon. Most       prevalent in the sigmoid colon. Estimated blood loss: none.      The terminal ileum appeared normal. Estimated blood loss: none.      Non-bleeding internal hemorrhoids were found during retroflexion. The       hemorrhoids were Grade II (internal hemorrhoids that prolapse but reduce       spontaneously). Estimated blood loss: none.      Two sessile polyps were found in the ascending colon and cecum. The       polyps were 3 to 4 mm in size. These polyps were removed with a cold       snare. Resection and retrieval were complete. Estimated blood loss was       minimal.  A 1 to 2 mm polyp was found in the sigmoid colon. The polyp was sessile.       The polyp was removed with a jumbo cold forceps. Resection and retrieval       were complete. Estimated blood loss was minimal.      Retroflexion in the right colon was performed.      The exam was otherwise without abnormality on direct and retroflexion       views. Impression:            - Perianal skin tags found on perianal exam.                        - Diverticulosis in the entire examined colon.                        - The examined portion of the ileum was normal.                        - Non-bleeding internal hemorrhoids.                        - Two 3 to 4 mm polyps in the ascending colon and in                         the cecum, removed with a cold snare. Resected and                          retrieved.                        - One 1 to 2 mm polyp in the sigmoid colon, removed                         with a jumbo cold forceps. Resected and retrieved.                        - The examination was otherwise normal on direct and                         retroflexion views. Recommendation:        - Patient has a contact number available for                         emergencies. The signs and symptoms of potential                         delayed complications were discussed with the patient.                         Return to normal activities tomorrow. Written                         discharge instructions were provided to the patient.                        - Discharge patient to home.                        -  Resume previous diet.                        - Continue present medications.                        - No aspirin, ibuprofen, naproxen, or other                         non-steroidal anti-inflammatory drugs for 5 days after                         polyp removal.                        - Await pathology results.                        - Repeat colonoscopy for surveillance based on                         pathology results.                        - Return to referring physician as previously                         scheduled.                        - The findings and recommendations were discussed with                         the patient. Procedure Code(s):     --- Professional ---                        [phone], Colonoscopy, flexible; with removal of                         tumor(s), polyp(s), or other lesion(s) by snare                         technique                        45380, 41, Colonoscopy, flexible; with biopsy, single                         or multiple Diagnosis Code(s):     --- Professional ---                        K63.5, Polyp of colon                        Z86.010, Personal history of colonic polyps                        K64.1, Second degree hemorrhoids                         K64.4, Residual hemorrhoidal skin tags  K57.30, Diverticulosis of large intestine without                         perforation or abscess without bleeding CPT copyright 2019 American Medical Association. All rights reserved. The codes documented in this report are preliminary and upon coder review may  be revised to meet current compliance requirements. Attending Participation:      I personally performed the entire procedure. Volney American, DO Annamaria Helling DO, DO 10/26/2021 2:34:05 PM This report has been signed electronically. Number of Addenda: 0 Note Initiated On: 10/26/2021 1:46 PM Scope Withdrawal Time: 0 hours 15 minutes 24 seconds  Total Procedure Duration: 0 hours 19 minutes 35 seconds  Estimated Blood Loss:  Estimated blood loss was minimal.      [facility]

## 2021-10-27 ENCOUNTER — Encounter: Payer: Self-pay | Admitting: Gastroenterology

## 2021-10-28 LAB — SURGICAL PATHOLOGY

## 2021-10-28 NOTE — Telephone Encounter (Signed)
Call pt  Patient is intolerant to Fosamax.  I do have another option to offer her at this time for osteoporosis and advise treatment.     Is she willing to see endocrinology to discuss further alternatives?  If agreeable, please place endocrinology referral reason:  osteoporosis

## 2021-10-30 ENCOUNTER — Other Ambulatory Visit: Payer: Self-pay

## 2021-10-30 DIAGNOSIS — Z13820 Encounter for screening for osteoporosis: Secondary | ICD-10-CM

## 2021-10-30 NOTE — Telephone Encounter (Signed)
Spoke to patient in regards to note and she is willing to explore other options. Endocrinology Referral placed

## 2021-10-30 NOTE — Progress Notes (Signed)
am

## 2021-11-03 ENCOUNTER — Ambulatory Visit
Admission: RE | Admit: 2021-11-03 | Discharge: 2021-11-03 | Disposition: A | Discharge status: Home / Self Care | Payer: Medicare Other | Source: Ambulatory Visit | Attending: Family | Admitting: Family

## 2021-11-03 DIAGNOSIS — N6489 Other specified disorders of breast: Secondary | ICD-10-CM | POA: Insufficient documentation

## 2021-11-03 DIAGNOSIS — Z1231 Encounter for screening mammogram for malignant neoplasm of breast: Secondary | ICD-10-CM | POA: Diagnosis not present

## 2021-11-03 DIAGNOSIS — N6321 Unspecified lump in the left breast, upper outer quadrant: Secondary | ICD-10-CM | POA: Diagnosis not present

## 2021-11-03 DIAGNOSIS — R92313 Mammographic fatty tissue density, bilateral breasts: Secondary | ICD-10-CM | POA: Diagnosis not present

## 2021-11-03 DIAGNOSIS — Z0000 Encounter for general adult medical examination without abnormal findings: Secondary | ICD-10-CM

## 2021-11-12 DIAGNOSIS — G4733 Obstructive sleep apnea (adult) (pediatric): Secondary | ICD-10-CM | POA: Diagnosis not present

## 2022-01-12 DIAGNOSIS — N1832 Chronic kidney disease, stage 3b: Secondary | ICD-10-CM | POA: Diagnosis not present

## 2022-01-12 DIAGNOSIS — I129 Hypertensive chronic kidney disease with stage 1 through stage 4 chronic kidney disease, or unspecified chronic kidney disease: Secondary | ICD-10-CM | POA: Diagnosis not present

## 2022-02-10 ENCOUNTER — Other Ambulatory Visit: Payer: Self-pay | Admitting: Family

## 2022-02-22 DIAGNOSIS — H40053 Ocular hypertension, bilateral: Secondary | ICD-10-CM | POA: Diagnosis not present

## 2022-02-22 DIAGNOSIS — H35373 Puckering of macula, bilateral: Secondary | ICD-10-CM | POA: Diagnosis not present

## 2022-02-22 DIAGNOSIS — Z961 Presence of intraocular lens: Secondary | ICD-10-CM | POA: Diagnosis not present

## 2022-02-22 DIAGNOSIS — G4733 Obstructive sleep apnea (adult) (pediatric): Secondary | ICD-10-CM | POA: Diagnosis not present

## 2022-03-01 ENCOUNTER — Telehealth: Payer: Self-pay | Admitting: Family

## 2022-03-01 ENCOUNTER — Other Ambulatory Visit: Payer: Self-pay | Admitting: Family

## 2022-03-01 DIAGNOSIS — I10 Essential (primary) hypertension: Secondary | ICD-10-CM

## 2022-03-01 MED ORDER — TRIAMTERENE-HCTZ 37.5-25 MG PO TABS: 1.0000 | ORAL_TABLET | Freq: Every day | ORAL | 3 refills | Status: DC

## 2022-03-01 NOTE — Telephone Encounter (Signed)
Pt need refill on triamterene sent to [facility]

## 2022-04-05 ENCOUNTER — Ambulatory Visit: Payer: Medicare Other | Admitting: Internal Medicine

## 2022-04-05 ENCOUNTER — Encounter: Payer: Self-pay | Admitting: Internal Medicine

## 2022-04-05 VITALS — BP 136/80 | HR 52 | Temp 97.3°F | Ht 60.0 in | Wt 166.8 lb

## 2022-04-05 DIAGNOSIS — G4733 Obstructive sleep apnea (adult) (pediatric): Secondary | ICD-10-CM | POA: Diagnosis not present

## 2022-04-05 NOTE — Patient Instructions (Addendum)
Continue CPAP as prescribed  GREAT JOB!!  A+ 

## 2022-04-05 NOTE — Progress Notes (Signed)
Name: Christie Stevenson MRN: [id number] DOB: September 08, 1940     CONSULTATION DATE: 8.15.19 REFERRING MD : arnette    STUDIES:  No current CT scans or CXR's HST--> 2019 SEVERE OS AHI 30  Chief complaint Follow up OSA   HPI Patient carries a diagnosis of severe sleep apnea since 2019 with AHI of 30 Patient doing well with therapy  CPAP DL shows excellent compliance  Reviewed in detail with patient  No exacerbation at this time No evidence of heart failure at this time No evidence or signs of infection at this time No respiratory distress No fevers, chills, nausea, vomiting, diarrhea No evidence of lower extremity edema No evidence hemoptysis       PAST MEDICAL HISTORY :   has a past medical history of 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ease, Carotid artery occlusion, Chronic kidney disease, Depression, GERD (gastroesophageal reflux disease), History of colon polyps, History of diverticulitis, History of kidney stones, Hyperlipidemia, Hypertension, Migraine, and Osteoporosis.  has a past surgical history that includes Cataract extraction (Left, 2014); Tubal ligation; Colonoscopy; Cataract extraction w/PHACO (Right, 04/01/2015); Colonoscopy with propofol (N/A, 05/26/2015); and Colonoscopy (N/A, 10/26/2021). Prior to Admission medications   Medication Sig Start Date End Date Taking? Authorizing Provider  alendronate (FOSAMAX) 70 MG tablet Take 1 tablet (70 mg total) by mouth every 7 (seven) days. Take with a full glass of water on an empty stomach. 08/19/17   Burnard Hawthorne, FNP  atorvastatin (LIPITOR) 40 MG tablet TAKE 1 TABLET (20 MG TOTAL) BY MOUTH DAILY. 04/11/17   Burnard Hawthorne, FNP  Calcium Carb-Cholecalciferol (CALCIUM 600 + D PO) Take 600 mg by mouth 2 (two) times daily.    [provider]  latanoprost (XALATAN) 0.005 % ophthalmic solution Place 1 drop into the right eye at bedtime.    [provider]  metoprolol succinate (TOPROL-XL) 25  MG 24 hr tablet TAKE 1 TABLET BY MOUTH EVERY DAY 06/14/17   Burnard Hawthorne, FNP  omeprazole (PRILOSEC) 20 MG capsule Take 20 mg by mouth daily.    [provider]  triamterene-hydrochlorothiazide (MAXZIDE-25) 37.5-25 MG tablet Take 1 tablet by mouth daily. 03/16/17   Burnard Hawthorne, FNP  triamterene-hydrochlorothiazide (MAXZIDE-25) 37.5-25 MG tablet TAKE 1 TABLET BY MOUTH EVERY DAY 06/14/17   Burnard Hawthorne, FNP   Allergies  Allergen Reactions   Fosamax [Alendronate]     Chest pain    FAMILY HISTORY:  family history includes Arrhythmia in her brother; Arthritis in her mother; Breast cancer in her sister; Cancer in her paternal uncle and sister; Diabetes in her daughter, daughter, and son; Heart attack (age of onset: 23) in her brother; Heart disease (age of onset: 97) in her brother; Hypertension in her mother; Osteoporosis in her brother, brother, mother, sister, and sister. SOCIAL HISTORY:  reports that she quit smoking about 27 years ago. Her smoking use included cigarettes. She has a 40.00 pack-year smoking history. She has never used smokeless tobacco. She reports that she does not drink alcohol and does not use drugs.   BP 136/80 (BP Location: Left Arm, Cuff Size: Normal)   Pulse (!) 52   Temp (!) 97.3 F (36.3 C)   Ht 5' (1.524 m)   Wt 166 lb 12.8 oz (75.7 kg)   SpO2 95%   BMI 32.58 kg/m     Review of Systems: Gen:  Denies  fever, sweats, chills weight loss  HEENT: Denies blurred vision, double vision, ear pain, eye pain, hearing  loss, nose bleeds, sore throat Cardiac:  No dizziness, chest pain or heaviness, chest tightness,edema, No JVD Resp:   No cough, -sputum production, -shortness of breath,-wheezing, -hemoptysis,  Other:  All other systems negative   Physical Examination:   General Appearance: No distress  EYES PERRLA, EOM intact.   NECK Supple, No JVD Pulmonary: normal breath sounds, No wheezing.  CardiovascularNormal S1,S2.  No m/r/g.    Abdomen: Benign, Soft, non-tender. Neurology UE/LE 5/5 strength, no focal deficits Ext pulses intact, cap refill intact ALL OTHER ROS ARE NEGATIVE   ASSESSMENT / PLAN:  SEVERE OSA AHI 30 WELL CONTROLLED WITH CPAP THERAPY V AUTO 22/8 5cm h20, AHI reduced to 4.4  No evidence of heart failure at this time No evidence or signs of infection at this time No respiratory distress No fevers, chills, nausea, vomiting, diarrhea No evidence of lower extremity edema No evidence hemoptysis     MEDICATION ADJUSTMENTS/LABS AND TESTS ORDERED: Continue CPAP as prescribed  Patient satisfied with Plan of action and management. All questions answered  Follow up in 1 year  TOTAL TIME SPENT 15 mins  Keiri Solano Patricia Pesa, M.D.  [facility]  Medical Director [place] Director [facility] Cardio-Pulmonary Department

## 2022-06-01 DIAGNOSIS — G4733 Obstructive sleep apnea (adult) (pediatric): Secondary | ICD-10-CM | POA: Diagnosis not present

## 2022-06-08 ENCOUNTER — Other Ambulatory Visit: Payer: Self-pay | Admitting: *Deleted

## 2022-06-08 DIAGNOSIS — I6529 Occlusion and stenosis of unspecified carotid artery: Secondary | ICD-10-CM

## 2022-06-21 ENCOUNTER — Ambulatory Visit: Payer: Medicare Other | Admitting: Physician Assistant

## 2022-06-21 ENCOUNTER — Ambulatory Visit (HOSPITAL_COMMUNITY)
Admission: RE | Admit: 2022-06-21 | Discharge: 2022-06-21 | Disposition: A | Discharge status: Home / Self Care | Payer: Medicare Other | Source: Ambulatory Visit | Attending: Surgery | Admitting: Surgery

## 2022-06-21 VITALS — BP 153/8 | HR 55 | Temp 98.0°F | Resp 20 | Ht 60.0 in | Wt 166.0 lb

## 2022-06-21 DIAGNOSIS — I6521 Occlusion and stenosis of right carotid artery: Secondary | ICD-10-CM | POA: Diagnosis not present

## 2022-06-21 DIAGNOSIS — I6529 Occlusion and stenosis of unspecified carotid artery: Secondary | ICD-10-CM | POA: Insufficient documentation

## 2022-06-21 NOTE — Progress Notes (Signed)
History of Present Illness:  Patient is a 82 y.o. year old female who presents for evaluation of carotid stenosis.  She has been followed since 2021 for asymptomatic carotid stenosis.  She was last seen a year ago with right < 59% stenosis and left < 39% stenosis.   She continues to  denies symptoms of TIA, amaurosis, or stroke. Past medical history: stage III chronic renal insufficiency. She is medically managed for hypertension. She takes a statin for hypercholesterolemia. She does not take an aspirin as she states that she bleeds very easily.     Past Medical History:  Diagnosis Date   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ease    Carotid artery occlusion    Chronic kidney disease    STAGE 3   Depression    GERD (gastroesophageal reflux disease)    History of colon polyps    History of diverticulitis    History of kidney stones    Hyperlipidemia    Hypertension    Migraine    Osteoporosis     Past Surgical History:  Procedure Laterality Date   CATARACT EXTRACTION Left 2014   CATARACT EXTRACTION W/PHACO Right 04/01/2015   Procedure: CATARACT EXTRACTION PHACO AND INTRAOCULAR LENS PLACEMENT (IOC);  Surgeon: Galen Manila, MD;  Location: [facility] ORS;  Service: Ophthalmology;  Laterality: Right;  [place] 00:47 AP% 20.8 CDE 9.76 fluid pack lot # [id number]   COLONOSCOPY     COLONOSCOPY N/A 10/26/2021   Procedure: COLONOSCOPY;  Surgeon: Jaynie Maribella Kuna, DO;  Location: [facility] ENDOSCOPY;  Service: Gastroenterology;  Laterality: N/A;   COLONOSCOPY WITH PROPOFOL N/A 05/26/2015   Procedure: COLONOSCOPY WITH PROPOFOL;  Surgeon: Scot Jun, MD;  Location: [facility] ENDOSCOPY;  Service: Endoscopy;  Laterality: N/A;   TUBAL LIGATION       Social History Social History   Tobacco Use   Smoking status: Former    Packs/day: 1.00    Years: 40.00    Additional pack years: 0.00    Total pack years: 40.00    Types: Cigarettes    Quit date: 07/22/1994    Years since quitting:  27.9   Smokeless tobacco: Never  Vaping Use   Vaping Use: Never used  Substance Use Topics   Alcohol use: No    Comment: 1 glass of wine on holidays.   Drug use: No    Family History Family History  Problem Relation Age of Onset   Arthritis Mother    Hypertension Mother    Osteoporosis Mother    Osteoporosis Sister    Cancer Sister        Breast Cancer    Osteoporosis Sister    Breast cancer Sister        mid 56's   Osteoporosis Brother    Heart disease Brother 62       CABG and stents   Osteoporosis Brother    Heart attack Brother 57   Arrhythmia Brother        s/p Chart[employer] certified accountant   Diabetes Daughter    Diabetes Daughter    Diabetes Son    Cancer Paternal Uncle        Prostate Cancer    Allergies  Allergies  Allergen Reactions   Fosamax [Alendronate]     Chest pain     Current Outpatient Medications  Medication Sig Dispense Refill   Apoaequorin 10 MG CAPS Take by mouth.      atorvastatin (LIPITOR) 40 MG tablet TAKE 1 TABLET BY MOUTH  EVERY DAY 90 tablet 1   citalopram (CELEXA) 10 MG tablet TAKE 1 TABLET BY MOUTH EVERY DAY 90 tablet 2   latanoprost (XALATAN) 0.005 % ophthalmic solution Place 1 drop into the right eye at bedtime.     metoprolol succinate (TOPROL-XL) 25 MG 24 hr tablet TAKE 1 TABLET BY MOUTH EVERY DAY 90 tablet 1   Multiple Vitamins-Minerals (MULTIPLE VITAMINS/WOMENS) tablet Take 1 tablet by mouth daily.     omeprazole (PRILOSEC) 20 MG capsule Take 20 mg by mouth daily.     potassium chloride (KLOR-CON) 10 MEQ tablet TAKE 1 TABLET BY MOUTH EVERY OTHER DAY. 180 tablet 1   triamterene-hydrochlorothiazide (MAXZIDE-25) 37.5-25 MG tablet Take 1 tablet by mouth daily. (Patient taking differently: Take 0.5 tablets by mouth daily.) 90 tablet 3   No current facility-administered medications for this visit.    ROS:   General:  No weight loss, Fever, chills  HEENT: No recent headaches, no nasal bleeding, no visual changes, no sore throat  Neurologic: No  dizziness, blackouts, seizures. No recent symptoms of stroke or mini- stroke. No recent episodes of slurred speech, or temporary blindness.  Cardiac: No recent episodes of chest pain/pressure, no shortness of breath at rest.  No shortness of breath with exertion.  Denies history of atrial fibrillation or irregular heartbeat  Vascular: No history of rest pain in feet.  No history of claudication.  No history of non-healing ulcer, No history of DVT   Pulmonary: No home oxygen, no productive cough, no hemoptysis,  No asthma or wheezing  Musculoskeletal:  [ ]  Arthritis, [ ]  Low back pain,  [ ]  Joint pain  Hematologic:No history of hypercoagulable state.  No history of easy bleeding.  No history of anemia  Gastrointestinal: No hematochezia or melena,  No gastroesophageal reflux, no trouble swallowing  Urinary: [ ]  chronic Kidney disease, [ ]  on HD - [ ]  MWF or [ ]  TTHS, [ ]  Burning with urination, [ ]  Frequent urination, [ ]  Difficulty urinating;   Skin: No rashes  Psychological: No history of anxiety,  No history of depression   Physical Examination  Vitals:   06/21/22 1018  BP: (!) 153/8  Pulse: (!) 55  Resp: 20  Temp: 98 F (36.7 C)  SpO2: 97%  Weight: 166 lb (75.3 kg)  Height: 5' (1.524 m)    Body mass index is 32.42 kg/m.  General:  Alert and oriented, no acute distress HEENT: Normal Neck: No bruit or JVD Pulmonary: Clear to auscultation bilaterally Cardiac: Regular Rate and Rhythm without murmur Gastrointestinal: Soft, non-tender, non-distended, no mass, no scars Skin: No rash Extremity Pulses:  2+ radial,femoral, dorsalis pedis,  pulses bilaterally Musculoskeletal: No deformity or edema  Neurologic: Upper and lower extremity motor 5/5 and symmetric  DATA:  Right Carotid Findings:  +----------+--------+--------+--------+-------------------------+--------+           PSV cm/sEDV cm/sStenosisPlaque Description       Comments   +----------+--------+--------+--------+-------------------------+--------+  CCA Prox  65      14              heterogenous                       +----------+--------+--------+--------+-------------------------+--------+  CCA Distal62      13              heterogenous                       +----------+--------+--------+--------+-------------------------+--------+  ICA Prox  192     55      40-59%  heterogenous and calcific          +----------+--------+--------+--------+-------------------------+--------+  ICA Mid   76      26                                                 +----------+--------+--------+--------+-------------------------+--------+  ICA Distal71      23                                                 +----------+--------+--------+--------+-------------------------+--------+  ECA      124     17                                                 +----------+--------+--------+--------+-------------------------+--------+   +----------+--------+-------+--------+-------------------+           PSV cm/sEDV cmsDescribeArm Pressure (mmHG)  +----------+--------+-------+--------+-------------------+  [id number]                    140                  +----------+--------+-------+--------+-------------------+   +---------+--------+--+--------+-+---------+  VertebralPSV cm/s35EDV cm/s7Antegrade  +---------+--------+--+--------+-+---------+      Left Carotid Findings:  +----------+--------+--------+--------+------------------+--------+           PSV cm/sEDV cm/sStenosisPlaque DescriptionComments  +----------+--------+--------+--------+------------------+--------+  CCA Prox  81      16              heterogenous                +----------+--------+--------+--------+------------------+--------+  CCA Distal72      18              heterogenous                 +----------+--------+--------+--------+------------------+--------+  ICA Prox  91      22      1-39%   heterogenous                +----------+--------+--------+--------+------------------+--------+  ICA Mid   111     35                                          +----------+--------+--------+--------+------------------+--------+  ICA Distal107     36                                          +----------+--------+--------+--------+------------------+--------+  ECA      114     11                                          +----------+--------+--------+--------+------------------+--------+   +----------+--------+--------+--------+-------------------+           PSV cm/sEDV cm/sDescribeArm Pressure (mmHG)  +----------+--------+--------+--------+-------------------+  Subclavian74                     140                  +----------+--------+--------+--------+-------------------+   +---------+--------+--+--------+--+---------+  VertebralPSV cm/s37EDV cm/s11Antegrade  +---------+--------+--+--------+--+---------+     Summary:  Right Carotid: Velocities in the right ICA are consistent with a 40-59%                 stenosis.   Left Carotid: Velocities in the left ICA are consistent with a 1-39%  stenosis.   Vertebrals: Bilateral vertebral arteries demonstrate antegrade flow.    ASSESSMENT/PLAN:  Asymptomatic Carotid stenosis The duplex is unchanged when compared to the previous study a year ago.    Right Carotid: Velocities in the right ICA are consistent with a 40-59% stenosis.                   Left Carotid: Velocities in the left ICA are consistent with a 1-39%  stenosis.  She will return in 1 year for surveillance ultrasound.   If she develops symptoms of stroke/TIA she will call 911.    Mosetta Pigeon PA-C [facility] [place] Office: [phone]  MD in clinic [place]

## 2022-07-19 DIAGNOSIS — I129 Hypertensive chronic kidney disease with stage 1 through stage 4 chronic kidney disease, or unspecified chronic kidney disease: Secondary | ICD-10-CM | POA: Diagnosis not present

## 2022-07-19 DIAGNOSIS — N2581 Secondary hyperparathyroidism of renal origin: Secondary | ICD-10-CM | POA: Diagnosis not present

## 2022-07-19 DIAGNOSIS — N1832 Chronic kidney disease, stage 3b: Secondary | ICD-10-CM | POA: Diagnosis not present

## 2022-08-06 ENCOUNTER — Other Ambulatory Visit: Payer: Self-pay | Admitting: Family

## 2022-08-19 ENCOUNTER — Other Ambulatory Visit: Payer: Self-pay | Admitting: Family

## 2022-08-19 DIAGNOSIS — F32A Depression, unspecified: Secondary | ICD-10-CM

## 2022-08-25 DIAGNOSIS — H40053 Ocular hypertension, bilateral: Secondary | ICD-10-CM | POA: Diagnosis not present

## 2022-08-25 DIAGNOSIS — Z961 Presence of intraocular lens: Secondary | ICD-10-CM | POA: Diagnosis not present

## 2022-08-25 DIAGNOSIS — H35373 Puckering of macula, bilateral: Secondary | ICD-10-CM | POA: Diagnosis not present

## 2022-08-26 ENCOUNTER — Ambulatory Visit: Payer: Medicare Other | Admitting: Internal Medicine

## 2022-09-07 ENCOUNTER — Telehealth: Payer: Self-pay | Admitting: Family

## 2022-09-07 NOTE — Telephone Encounter (Signed)
Copied from CRM [id number]. Topic: Medicare AWV >> Sep 07, 2022 10:18 AM Payton Doughty wrote: Reason for CRM: LM 09/07/2022 to schedule AWV   Verlee Rossetti; Care Guide Ambulatory Clinical Support [place] [facility] Direct Dial: [phone]

## 2022-10-02 ENCOUNTER — Other Ambulatory Visit: Payer: Self-pay | Admitting: Family

## 2022-10-08 ENCOUNTER — Other Ambulatory Visit: Payer: Self-pay | Admitting: Family

## 2022-10-11 NOTE — Telephone Encounter (Signed)
Left detailed VM informing pt to call and schedule an appt pts LOV: 09-22-21 so that she can get refills

## 2022-10-13 NOTE — Telephone Encounter (Signed)
Lvm for pt to call and schedule appt.

## 2022-10-19 ENCOUNTER — Telehealth: Payer: Self-pay | Admitting: Family

## 2022-10-19 NOTE — Telephone Encounter (Signed)
Copied from CRM [id number]. Topic: Medicare AWV >> Oct 19, 2022 10:02 AM Payton Doughty wrote: Reason for CRM: Called LM 10/19/2022 to schedule AWV   Verlee Rossetti; Care Guide Ambulatory Clinical Support [place] [facility] Direct Dial: [phone]

## 2022-11-03 ENCOUNTER — Telehealth: Payer: Self-pay | Admitting: Family

## 2022-11-03 MED ORDER — ATORVASTATIN CALCIUM 40 MG PO TABS: 40.0000 mg | ORAL_TABLET | Freq: Every day | ORAL | 1 refills | Status: DC

## 2022-11-03 NOTE — Telephone Encounter (Signed)
Patient called and would like a med refill for atorvastatin (LIPITOR) 40 MG tablet  Please send to [facility] [address].

## 2022-11-03 NOTE — Telephone Encounter (Signed)
LVM to inform pt refill for Atorvastatin has been sent in to pharmacy

## 2022-11-04 NOTE — Telephone Encounter (Signed)
Pt is aware.  

## 2022-11-11 ENCOUNTER — Ambulatory Visit (INDEPENDENT_AMBULATORY_CARE_PROVIDER_SITE_OTHER): Payer: Medicare Other | Admitting: Family

## 2022-11-11 ENCOUNTER — Encounter: Payer: Self-pay | Admitting: Family

## 2022-11-11 VITALS — BP 136/76 | HR 54 | Temp 97.4°F | Ht 60.0 in | Wt 160.6 lb

## 2022-11-11 DIAGNOSIS — E785 Hyperlipidemia, unspecified: Secondary | ICD-10-CM

## 2022-11-11 DIAGNOSIS — Z780 Asymptomatic menopausal state: Secondary | ICD-10-CM | POA: Diagnosis not present

## 2022-11-11 DIAGNOSIS — Z1231 Encounter for screening mammogram for malignant neoplasm of breast: Secondary | ICD-10-CM

## 2022-11-11 DIAGNOSIS — Z23 Encounter for immunization: Secondary | ICD-10-CM

## 2022-11-11 DIAGNOSIS — I10 Essential (primary) hypertension: Secondary | ICD-10-CM

## 2022-11-11 LAB — COMPREHENSIVE METABOLIC PANEL
ALT: 13 U/L (ref 0–35)
AST: 20 U/L (ref 0–37)
Albumin: 4 g/dL (ref 3.5–5.2)
Alkaline Phosphatase: 110 U/L (ref 39–117)
BUN: 22 mg/dL (ref 6–23)
CO2: 29 meq/L (ref 19–32)
Calcium: 10.4 mg/dL (ref 8.4–10.5)
Chloride: 102 meq/L (ref 96–112)
Creatinine, Ser: 1.22 mg/dL — ABNORMAL HIGH (ref 0.40–1.20)
GFR: 41.68 mL/min — ABNORMAL LOW (ref >60.00)
Glucose, Bld: 84 mg/dL (ref 70–99)
Potassium: 3.7 meq/L (ref 3.5–5.1)
Sodium: 138 meq/L (ref 135–145)
Total Bilirubin: 0.8 mg/dL (ref 0.2–1.2)
Total Protein: 7.1 g/dL (ref 6.0–8.3)

## 2022-11-11 LAB — LIPID PANEL
Cholesterol: 144 mg/dL (ref 0–200)
HDL: 47.4 mg/dL (ref >39.00)
LDL Cholesterol: 74 mg/dL (ref 0–99)
NonHDL: 96.36
Total CHOL/HDL Ratio: 3
Triglycerides: 112 mg/dL (ref 0.0–149.0)
VLDL: 22.4 mg/dL (ref 0.0–40.0)

## 2022-11-11 LAB — TSH: TSH: 2.14 u[IU]/mL (ref 0.35–5.50)

## 2022-11-11 MED ORDER — METOPROLOL SUCCINATE ER 25 MG PO TB24: 12.5000 mg | ORAL_TABLET | Freq: Every day | ORAL | Status: DC

## 2022-11-11 NOTE — Patient Instructions (Addendum)
Please call  and schedule your 3D mammogram and /or bone density scan as we discussed.   [facility]  ( new location in 2023)  [address], [place], [place] [address]  Christie Stevenson, [place]  [phone]   Your heart rate today was in the 50s, I have reduced the dose of metoprolol from 25 mg daily to 12.5 mg daily.  I would like to see your heart rate in a normal range between 60-90.  The goal of blood pressure is 130/80 or less.  Please continue triamterene hydrochlorothiazide.  Please send me blood pressure heart rate readings from home, several is most helpful over different days.  Nice to see you!  Managing Your Hypertension Hypertension, also called high blood pressure, is when the force of the blood pressing against the walls of the arteries is too strong. Arteries are blood vessels that carry blood from your heart throughout your body. Hypertension forces the heart to work harder to pump blood and may cause the arteries to become narrow or stiff. Understanding blood pressure readings A blood pressure reading includes a higher number over a lower number: The first, or top, number is called the systolic pressure. It is a measure of the pressure in your arteries as your heart beats. The second, or bottom number, is called the diastolic pressure. It is a measure of the pressure in your arteries as the heart relaxes. For most people, a normal blood pressure is below 120/80. Your personal target blood pressure may vary depending on your medical conditions, your age, and other factors. Blood pressure is classified into four stages. Based on your blood pressure reading, your health care provider may use the following stages to determine what type of treatment you need, if any. Systolic pressure and diastolic pressure are measured in a unit called millimeters of mercury (mmHg). Normal Systolic pressure: below 120. Diastolic pressure: below 80. Elevated Systolic pressure:  120-129. Diastolic pressure: below 80. Hypertension stage 1 Systolic pressure: 130-139. Diastolic pressure: 80-89. Hypertension stage 2 Systolic pressure: 140 or above. Diastolic pressure: 90 or above. How can this condition affect me? Managing your hypertension is very important. Over time, hypertension can damage the arteries and decrease blood flow to parts of the body, including the brain, heart, and kidneys. Having untreated or uncontrolled hypertension can lead to: A heart attack. A stroke. A weakened blood vessel (aneurysm). Heart failure. Kidney damage. Eye damage. Memory and concentration problems. Vascular dementia. What actions can I take to manage this condition? Hypertension can be managed by making lifestyle changes and possibly by taking medicines. Your health care provider will help you make a plan to bring your blood pressure within a normal range. You may be referred for counseling on a healthy diet and physical activity. Nutrition  Eat a diet that is high in fiber and potassium, and low in salt (sodium), added sugar, and fat. An example eating plan is called the DASH diet. DASH stands for Dietary Approaches to Stop Hypertension. To eat this way: Eat plenty of fresh fruits and vegetables. Try to fill one-half of your plate at each meal with fruits and vegetables. Eat whole grains, such as whole-wheat pasta, brown rice, or whole-grain bread. Fill about one-fourth of your plate with whole grains. Eat low-fat dairy products. Avoid fatty cuts of meat, processed or cured meats, and poultry with skin. Fill about one-fourth of your plate with lean proteins such as fish, chicken without skin, beans, eggs, and tofu. Avoid pre-made and processed  foods. These tend to be higher in sodium, added sugar, and fat. Reduce your daily sodium intake. Many people with hypertension should eat less than 1,500 mg of sodium a day. Lifestyle  Work with your health care provider to maintain a  healthy body weight or to lose weight. Ask what an ideal weight is for you. Get at least 30 minutes of exercise that causes your heart to beat faster (aerobic exercise) most days of the week. Activities may include walking, swimming, or biking. Include exercise to strengthen your muscles (resistance exercise), such as weight lifting, as part of your weekly exercise routine. Try to do these types of exercises for 30 minutes at least 3 days a week. Do not use any products that contain nicotine or tobacco. These products include cigarettes, chewing tobacco, and vaping devices, such as e-cigarettes. If you need help quitting, ask your health care provider. Control any long-term (chronic) conditions you have, such as high cholesterol or diabetes. Identify your sources of stress and find ways to manage stress. This may include meditation, deep breathing, or making time for fun activities. Alcohol use Do not drink alcohol if: Your health care provider tells you not to drink. You are pregnant, may be pregnant, or are planning to become pregnant. If you drink alcohol: Limit how much you have to: 0-1 drink a day for women. 0-2 drinks a day for men. Know how much alcohol is in your drink. In the [place] one drink equals one 12 oz bottle of beer (355 mL), one 5 oz glass of wine (148 mL), or one 1 oz glass of hard liquor (44 mL). Medicines Your health care provider may prescribe medicine if lifestyle changes are not enough to get your blood pressure under control and if: Your systolic blood pressure is 130 or higher. Your diastolic blood pressure is 80 or higher. Take medicines only as told by your health care provider. Follow the directions carefully. Blood pressure medicines must be taken as told by your health care provider. The medicine does not work as well when you skip doses. Skipping doses also puts you at risk for problems. Monitoring Before you monitor your blood pressure: Do not smoke, drink  caffeinated beverages, or exercise within 30 minutes before taking a measurement. Use the bathroom and empty your bladder (urinate). Sit quietly for at least 5 minutes before taking measurements. Monitor your blood pressure at home as told by your health care provider. To do this: Sit with your back straight and supported. Place your feet flat on the floor. Do not cross your legs. Support your arm on a flat surface, such as a table. Make sure your upper arm is at heart level. Each time you measure, take two or three readings one minute apart and record the results. You may also need to have your blood pressure checked regularly by your health care provider. General information Talk with your health care provider about your diet, exercise habits, and other lifestyle factors that may be contributing to hypertension. Review all the medicines you take with your health care provider because there may be side effects or interactions. Keep all follow-up visits. Your health care provider can help you create and adjust your plan for managing your high blood pressure. Where to find more information National Heart, Lung, and Blood Institute: PopSteam.is American Heart Association: www.heart.org Contact a health care provider if: You think you are having a reaction to medicines you have taken. You have repeated (recurrent) headaches. You feel dizzy. You  have swelling in your ankles. You have trouble with your vision. Get help right away if: You develop a severe headache or confusion. You have unusual weakness or numbness, or you feel faint. You have severe pain in your chest or abdomen. You vomit repeatedly. You have trouble breathing. These symptoms may be an emergency. Get help right away. Call 911. Do not wait to see if the symptoms will go away. Do not drive yourself to the hospital. Summary Hypertension is when the force of blood pumping through your arteries is too strong. If this  condition is not controlled, it may put you at risk for serious complications. Your personal target blood pressure may vary depending on your medical conditions, your age, and other factors. For most people, a normal blood pressure is less than 120/80. Hypertension is managed by lifestyle changes, medicines, or both. Lifestyle changes to help manage hypertension include losing weight, eating a healthy, low-sodium diet, exercising more, stopping smoking, and limiting alcohol. This information is not intended to replace advice given to you by your health care provider. Make sure you discuss any questions you have with your health care provider. Document Revised: 09/18/2020 Document Reviewed: 09/18/2020 Elsevier Patient Education  2024 [employer].

## 2022-11-11 NOTE — Progress Notes (Signed)
Assessment & Plan:  Essential hypertension Assessment & Plan: HR 50 today.  She is not experiencing any symptomatic PVCs at this time .  reduced the dose of metoprolol from 25 mg daily to 12.5 mg daily.Continue triamterene hydrochlorothiazide 37.5-25mg .  Patient will send me heart rate and blood pressure readings from home.  Orders: -     Comprehensive metabolic panel -     Metoprolol Succinate ER; Take 0.5 tablets (12.5 mg total) by mouth daily. -     TSH  Need for shingles vaccine -     Varicella Zoster Abs, IgG/IgM  Hyperlipidemia LDL goal <70 Assessment & Plan: Lab Results  Component Value Date   LDLCALC 74 11/11/2022   Excellent control, continue lipitor 40mg  every day  Orders: -     Lipid panel  Encounter for screening mammogram for malignant neoplasm of breast -     3D Screening Mammogram, Left and Right; Future  Asymptomatic postmenopausal state -     DG Bone Density; Future     Return precautions given.   Risks, benefits, and alternatives of the medications and treatment plan prescribed today were discussed, and patient expressed understanding.   Education regarding symptom management and diagnosis given to patient on AVS either electronically or printed.  Return in about 1 year (around 11/11/2023) for Follow Up Chronic Management.  Rennie Plowman, FNP  Subjective:    Patient ID: Christie Stevenson, female    DOB: [birth year], 82 y.o.   MRN: [id number]  CC: Christie Stevenson is a 82 y.o. female who presents today for follow up.   HPI: Feels well today.  No new complaints   She remains compliant with Lipitor 40 mg daily.  She is doing well on Celexa 10 mg daily Denies palpitations, chest pain dizziness or syncope  Allergies: Fosamax [alendronate] Current Outpatient Medications on File Prior to Visit  Medication Sig Dispense Refill   atorvastatin (LIPITOR) 40 MG tablet Take 1 tablet (40 mg total) by mouth daily. 90 tablet 1   citalopram (CELEXA) 10 MG  tablet TAKE 1 TABLET BY MOUTH EVERY DAY 90 tablet 2   latanoprost (XALATAN) 0.005 % ophthalmic solution Place 1 drop into the right eye at bedtime.     Multiple Vitamins-Minerals (MULTIPLE VITAMINS/WOMENS) tablet Take 1 tablet by mouth daily.     omeprazole (PRILOSEC) 20 MG capsule Take 20 mg by mouth daily.     triamterene-hydrochlorothiazide (MAXZIDE-25) 37.5-25 MG tablet Take 1 tablet by mouth daily. (Patient taking differently: Take 0.5 tablets by mouth daily.) 90 tablet 3   No current facility-administered medications on file prior to visit.    Review of Systems  Constitutional:  Negative for chills and fever.  Respiratory:  Negative for cough.   Cardiovascular:  Negative for chest pain and palpitations.  Gastrointestinal:  Negative for nausea and vomiting.      Objective:    BP 136/76   Pulse (!) 54   Temp (!) 97.4 F (36.3 C) (Temporal)   Ht 5' (1.524 m)   Wt 160 lb 9.6 oz (72.8 kg)   SpO2 98%   BMI 31.37 kg/m  BP Readings from Last 3 Encounters:  11/11/22 136/76  06/21/22 (!) 153/8  04/05/22 136/80   Wt Readings from Last 3 Encounters:  11/11/22 160 lb 9.6 oz (72.8 kg)  06/21/22 166 lb (75.3 kg)  04/05/22 166 lb 12.8 oz (75.7 kg)    Physical Exam Vitals reviewed.  Constitutional:      Appearance: She is  well-developed.  Eyes:     Conjunctiva/sclera: Conjunctivae normal.  Cardiovascular:     Rate and Rhythm: Normal rate and regular rhythm.     Pulses: Normal pulses.     Heart sounds: Normal heart sounds.  Pulmonary:     Effort: Pulmonary effort is normal.     Breath sounds: Normal breath sounds. No wheezing, rhonchi or rales.  Skin:    General: Skin is warm and dry.  Neurological:     Mental Status: She is alert.  Psychiatric:        Speech: Speech normal.        Behavior: Behavior normal.        Thought Content: Thought content normal.

## 2022-11-12 LAB — VARICELLA ZOSTER ABS, IGG/IGM
Varicella IgM: <0.91 {index} (ref 0.00–0.90)
Varicella zoster IgG: REACTIVE

## 2022-11-16 NOTE — Assessment & Plan Note (Addendum)
Lab Results  Component Value Date   LDLCALC 74 11/11/2022   Excellent control, continue lipitor 40mg  every day

## 2022-11-16 NOTE — Assessment & Plan Note (Addendum)
HR 50 today.  She is not experiencing any symptomatic PVCs at this time .  reduced the dose of metoprolol from 25 mg daily to 12.5 mg daily.Continue triamterene hydrochlorothiazide 37.5-25mg .  Patient will send me heart rate and blood pressure readings from home.

## 2022-11-29 ENCOUNTER — Telehealth: Payer: Self-pay

## 2022-11-29 NOTE — Telephone Encounter (Signed)
Dae,   Renal function slightly decreased however stable.  Please continue following with Dr. Thedore Mins Varicella-zoster is reactive indicating prior infection with chickenpox.  If you would like to proceed with Shingrix vaccine, you may get at your local pharmacy. Cholesterol is excellent Regards, Claris Che

## 2023-01-18 ENCOUNTER — Other Ambulatory Visit: Payer: Self-pay | Admitting: Family

## 2023-02-01 ENCOUNTER — Ambulatory Visit
Admission: RE | Admit: 2023-02-01 | Discharge: 2023-02-01 | Disposition: A | Discharge status: Home / Self Care | Payer: Medicare Other | Source: Ambulatory Visit | Attending: Family

## 2023-02-01 ENCOUNTER — Ambulatory Visit
Admission: RE | Admit: 2023-02-01 | Discharge: 2023-02-01 | Disposition: A | Discharge status: Home / Self Care | Payer: Medicare Other | Source: Ambulatory Visit | Attending: Family | Admitting: Family

## 2023-02-01 DIAGNOSIS — Z780 Asymptomatic menopausal state: Secondary | ICD-10-CM | POA: Insufficient documentation

## 2023-02-01 DIAGNOSIS — M810 Age-related osteoporosis without current pathological fracture: Secondary | ICD-10-CM | POA: Diagnosis not present

## 2023-02-01 DIAGNOSIS — Z1231 Encounter for screening mammogram for malignant neoplasm of breast: Secondary | ICD-10-CM

## 2023-02-02 DIAGNOSIS — N2581 Secondary hyperparathyroidism of renal origin: Secondary | ICD-10-CM | POA: Diagnosis not present

## 2023-02-02 DIAGNOSIS — I129 Hypertensive chronic kidney disease with stage 1 through stage 4 chronic kidney disease, or unspecified chronic kidney disease: Secondary | ICD-10-CM | POA: Diagnosis not present

## 2023-02-02 DIAGNOSIS — N1832 Chronic kidney disease, stage 3b: Secondary | ICD-10-CM | POA: Diagnosis not present

## 2023-02-02 DIAGNOSIS — R600 Localized edema: Secondary | ICD-10-CM | POA: Diagnosis not present

## 2023-02-10 ENCOUNTER — Encounter: Payer: Self-pay | Admitting: Family

## 2023-02-23 DIAGNOSIS — L218 Other seborrheic dermatitis: Secondary | ICD-10-CM | POA: Diagnosis not present

## 2023-02-24 ENCOUNTER — Encounter: Payer: Self-pay | Admitting: Family

## 2023-02-24 DIAGNOSIS — Z961 Presence of intraocular lens: Secondary | ICD-10-CM | POA: Diagnosis not present

## 2023-02-24 DIAGNOSIS — H40053 Ocular hypertension, bilateral: Secondary | ICD-10-CM | POA: Diagnosis not present

## 2023-02-24 DIAGNOSIS — H35373 Puckering of macula, bilateral: Secondary | ICD-10-CM | POA: Diagnosis not present

## 2023-02-28 ENCOUNTER — Ambulatory Visit: Payer: Medicare Other | Admitting: Internal Medicine

## 2023-02-28 ENCOUNTER — Telehealth: Payer: Self-pay

## 2023-02-28 ENCOUNTER — Encounter: Payer: Self-pay | Admitting: Internal Medicine

## 2023-02-28 VITALS — BP 128/80 | HR 66 | Ht 60.0 in | Wt 159.0 lb

## 2023-02-28 DIAGNOSIS — N2581 Secondary hyperparathyroidism of renal origin: Secondary | ICD-10-CM

## 2023-02-28 DIAGNOSIS — E559 Vitamin D deficiency, unspecified: Secondary | ICD-10-CM | POA: Diagnosis not present

## 2023-02-28 DIAGNOSIS — M810 Age-related osteoporosis without current pathological fracture: Secondary | ICD-10-CM

## 2023-02-28 MED ORDER — DENOSUMAB 60 MG/ML ~~LOC~~ SOSY
60.0000 mg | PREFILLED_SYRINGE | Freq: Once | SUBCUTANEOUS | Status: AC
  Administered 2023-04-08: 60 mg via SUBCUTANEOUS

## 2023-02-28 NOTE — Telephone Encounter (Signed)
 Prolia new start

## 2023-02-28 NOTE — Patient Instructions (Signed)
 Calcium  1200 mg daily  We will let you know if extra vitamin D  is needed

## 2023-02-28 NOTE — Progress Notes (Signed)
Name: Christie Stevenson  MRN/ DOB: [id number], [birth year]    Age/ Sex: 83 y.o., female    PCP: Allegra Grana, FNP   Reason for Endocrinology Evaluation: Osteoporosis      Date of Initial Endocrinology Evaluation: 02/28/2023     HPI: Christie Stevenson is a 83 y.o. female with a past medical history of HTN, OSA, GERD, osteoporosis. The patient presented for initial endocrinology clinic visit on 02/28/2023 for consultative assistance with her Osteoporosis .   Pt was diagnosed with osteoporosis: in 2017 with a T-score of -2.5 at the AP spine  Menopausal at age : late 63's  Fracture Hx: Right toes, right wrist and left ankle  Hx of HRT: yes- for ~15 yrs  FH of osteoporosis or hip fracture: Mother  Prior Hx of anti-resorptive therapy : Alendronate-chest pain was on it for ~ 8/10 years. She was approved for Prolia in 2024 but it was cost prohibitive   Has GERD symptoms  Denies constipation or diarrhea  No weight bearing exercises  No prior XRT expoSURE  No prior Cancer dx   Calcium/Vit D 600 mg , 1 tablet daily  MVI, 1 tab daily      HISTORY:  Past Medical History:  Past Medical History:  Diagnosis Date   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ease    Carotid artery occlusion    Chronic kidney disease    STAGE 3   Depression    GERD (gastroesophageal reflux disease)    History of colon polyps    History of diverticulitis    History of kidney stones    Hyperlipidemia    Hypertension    Migraine    Osteoporosis    Past Surgical History:  Past Surgical History:  Procedure Laterality Date   CATARACT EXTRACTION Left 2014   CATARACT EXTRACTION W/PHACO Right 04/01/2015   Procedure: CATARACT EXTRACTION PHACO AND INTRAOCULAR LENS PLACEMENT (IOC);  Surgeon: Galen Manila, MD;  Location: [facility] ORS;  Service: Ophthalmology;  Laterality: Right;  [place] 00:47 AP% 20.8 CDE 9.76 fluid pack lot # [id number]   COLONOSCOPY     COLONOSCOPY N/A 10/26/2021   Procedure:  COLONOSCOPY;  Surgeon: Jaynie Collins, DO;  Location: [facility] ENDOSCOPY;  Service: Gastroenterology;  Laterality: N/A;   COLONOSCOPY WITH PROPOFOL N/A 05/26/2015   Procedure: COLONOSCOPY WITH PROPOFOL;  Surgeon: Scot Jun, MD;  Location: [facility] ENDOSCOPY;  Service: Endoscopy;  Laterality: N/A;   TUBAL LIGATION      Social History:  reports that she quit smoking about 28 years ago. Her smoking use included cigarettes. She started smoking about 68 years ago. She has a 40 pack-year smoking history. She has never used smokeless tobacco. She reports that she does not drink alcohol and does not use drugs. Family History: family history includes Arrhythmia in her brother; Arthritis in her mother; Breast cancer in her sister; Cancer in her paternal uncle and sister; Diabetes in her daughter, daughter, and son; Heart attack (age of onset: 20) in her brother; Heart disease (age of onset: 49) in her brother; Hypertension in her mother; Osteoporosis in her brother, brother, mother, sister, and sister.   HOME MEDICATIONS: Allergies as of 02/28/2023       Reactions   Fosamax [alendronate]    Chest pain        Medication List        Accurate as of February 28, 2023 11:06 AM. If you have any questions, ask your nurse or doctor.  STOP taking these medications    citalopram 10 MG tablet Commonly known as: CELEXA Stopped by: Johnney Ou Bodey Frizell       TAKE these medications    atorvastatin 40 MG tablet Commonly known as: LIPITOR TAKE 1 TABLET BY MOUTH EVERY DAY   Comirnaty syringe Generic drug: COVID-19 mRNA vaccine (Pfizer)   fluconazole 200 MG tablet Commonly known as: DIFLUCAN   Fluzone High-Dose 0.5 ML injection Generic drug: Influenza vac split quadrivalent PF   ketoconazole 2 % cream Commonly known as: NIZORAL Apply 1 Application topically daily.   latanoprost 0.005 % ophthalmic solution Commonly known as: XALATAN Place 1 drop into the right eye at  bedtime.   metoprolol succinate 25 MG 24 hr tablet Commonly known as: TOPROL-XL Take 0.5 tablets (12.5 mg total) by mouth daily. What changed: how much to take   mometasone 0.1 % ointment Commonly known as: ELOCON Apply 1 Application topically 2 (two) times daily.   Multiple Vitamins/Womens tablet Take 1 tablet by mouth daily.   omeprazole 20 MG capsule Commonly known as: PRILOSEC Take 20 mg by mouth daily.   triamterene-hydrochlorothiazide 37.5-25 MG tablet Commonly known as: MAXZIDE-25 Take 1 tablet by mouth daily. What changed: how much to take          REVIEW OF SYSTEMS: A comprehensive ROS was conducted with the patient and is negative except as per HPI     OBJECTIVE:  VS: BP 128/80 (BP Location: Left Arm, Patient Position: Sitting, Cuff Size: Normal)   Pulse 66   Ht 5' (1.524 m)   Wt 159 lb (72.1 kg)   SpO2 99%   BMI 31.05 kg/m    Wt Readings from Last 3 Encounters:  02/28/23 159 lb (72.1 kg)  11/11/22 160 lb 9.6 oz (72.8 kg)  06/21/22 166 lb (75.3 kg)     EXAM: General: Pt appears well and is in NAD  Neck: General: Supple without adenopathy. Thyroid: Thyroid size normal.  No goiter or nodules appreciated.  Lungs: Clear with good BS bilat   Heart: Auscultation: RRR.  Abdomen: Soft, nontender  Extremities:  BL LE: No pretibial edema   Mental Status: Judgment, insight: Intact Orientation: Oriented to time, place, and person Mood and affect: No depression, anxiety, or agitation     DATA REVIEWED:   Latest Reference Range & Units 02/28/23 11:32  Calcium 8.6 - 10.4 mg/dL 16.1 (H)  Vitamin D, 09-[id number] 30 - 100 ng/mL 29 (L)  PTH, Intact 16 - 77 pg/mL 116 (H)  TSH 0.40 - 4.50 mIU/L 1.70  T4,Free(Direct) 0.8 - 1.8 ng/dL 1.4  (H): Data is abnormally high (L): Data is abnormally low      Latest Reference Range & Units 11/11/22 11:14  Sodium 135 - 145 mEq/L 138  Potassium 3.5 - 5.1 mEq/L 3.7  Chloride 96 - 112 mEq/L 102  CO2 19 - 32 mEq/L  29  Glucose 70 - 99 mg/dL 84  BUN 6 - 23 mg/dL 22  Creatinine 8.11 - 9.14 mg/dL 7.82 (H)  Calcium 8.4 - 10.5 mg/dL 95.6  Alkaline Phosphatase 39 - 117 U/L 110  Albumin 3.5 - 5.2 g/dL 4.0  AST 0 - 37 U/L 20  ALT 0 - 35 U/L 13  Total Protein 6.0 - 8.3 g/dL 7.1  Total Bilirubin 0.2 - 1.2 mg/dL 0.8  GFR >21.30 mL/min 41.68 (L)    Latest Reference Range & Units 11/11/22 11:14  TSH 0.35 - 5.50 uIU/mL 2.14       DXA  02/01/2023   DENSITOMETRY RESULTS: Site         Region     Measured Date Measured Age WHO Classification Young Adult T-score BMD         %Change vs. Previous Significant Change (*) DualFemur Neck Right 02/01/2023 81.5 Osteoporosis -2.9 0.634 g/cm2 -7.2% - DualFemur Neck Right 07/30/2019 78.0 Osteoporosis -2.6 0.683 g/cm2 -0.7% - DualFemur Neck Right 07/05/2017 75.9 Osteoporosis -2.5 0.688 g/cm2 - -   DualFemur Total Mean 02/01/2023 81.5 Osteopenia -2.4 0.705 g/cm2 -2.9% Yes DualFemur Total Mean 07/30/2019 78.0 Osteopenia -2.2 0.726 g/cm2 3.1% Yes DualFemur Total Mean 07/05/2017 75.9 Osteopenia -2.4 0.704 g/cm2 - -   Left Forearm Radius 33% 02/01/2023 81.5 Normal -0.9 0.797 g/cm2 -2.4% - Left Forearm Radius 33% 07/30/2019 78.0 Normal -0.7 0.817 g/cm2 -3.7% - Left Forearm Radius 33% 07/05/2017 75.9 Normal -0.3 0.848 g/cm2 - -   ASSESSMENT/PLAN/RECOMMENDATIONS:   Osteoporosis:  -Patient with history of multiple fractures -She was on alendronate for approximately 8-10 years, developed chest pain. -Her PCP attempted to obtain Prolia last year, but this was cost prohibitive -Emphasized the importance of optimizing calcium, vitamin D intake and weightbearing exercises -Today we discussed options to include zoledronic acid, Prolia, and PTH analogs -We opted to proceed with Prolia and see if co-pay is doable this year, if not, we may consider PTH analogs with patient assistance.  Zoledronic acid will be the last resort -I did caution against hypocalcemia with  Prolia  Medications : Calcium 1200 mg daily Prolia 60 mg subcutaneous every 6 months  2.  Vitamin D insufficiency  -Patient to add OTC vitamin D3 1000 IU daily  3.  Hypercalcemia  -PTH elevated, this is due to combination of CKD and vitamin D insufficiency -Unclear if she has true hypercalcemia at this time, but we will continue to monitor    Follow-up in 6 months  Signed electronically by: Lyndle Herrlich, MD  [facility]  [facility] [address] [place] [place], [place] [address] Phone: [phone] FAX: [phone]   CC: Allegra Grana, FNP [address] [place] [address] Phone: [phone] Fax: [phone]   Return to Endocrinology clinic as below: Future Appointments  Date Time Provider Department Center  02/28/2023 11:10 AM Martavia Tye, Konrad Dolores, MD [id number] None

## 2023-02-28 NOTE — Telephone Encounter (Signed)
Prolia VOB initiated via [email or web address]  Next Prolia inj DUE: new start

## 2023-03-01 ENCOUNTER — Telehealth: Payer: Self-pay | Admitting: Internal Medicine

## 2023-03-01 DIAGNOSIS — E559 Vitamin D deficiency, unspecified: Secondary | ICD-10-CM | POA: Insufficient documentation

## 2023-03-01 DIAGNOSIS — N2581 Secondary hyperparathyroidism of renal origin: Secondary | ICD-10-CM | POA: Insufficient documentation

## 2023-03-01 LAB — PTH, INTACT AND CALCIUM
Calcium: 10.5 mg/dL — ABNORMAL HIGH (ref 8.6–10.4)
PTH: 116 pg/mL — ABNORMAL HIGH (ref 16–77)

## 2023-03-01 LAB — TSH: TSH: 1.7 m[IU]/L (ref 0.40–4.50)

## 2023-03-01 LAB — VITAMIN D 25 HYDROXY (VIT D DEFICIENCY, FRACTURES): Vit D, 25-Hydroxy: 29 ng/mL — ABNORMAL LOW (ref 30–100)

## 2023-03-01 LAB — T4, FREE: Free T4: 1.4 ng/dL (ref 0.8–1.8)

## 2023-03-01 NOTE — Telephone Encounter (Signed)
I do not understand what she is asking. She has not had Prolia here. I last checked her medical benefits on 2023 & pt wanted to explore other options. Looks like she is going elsewhere for her Osteoporosis maintenance. Only one office needs to be involved in that care,

## 2023-03-01 NOTE — Telephone Encounter (Signed)
Please let the patient know that vitamin D is low, she needs to add over-the-counter vitamin D3 1000 international units daily   Parathyroid hormone is elevated, this could be due to low vitamin D   Calcium slightly elevated, this could be a false elevation due to not drinking enough water, patient will need to increase her water intake and we will continue to monitor her calcium levels    Thanks

## 2023-03-01 NOTE — Telephone Encounter (Addendum)
Medical [facility] - Prior Authorization REQUIRED  PA PROCESS DETAILS: Engineer, technical sales & Medicaid Plans  PA (Pre-determination) is required. Report PA required regardless of what the rep advises.  PUSHBACK: When speaking with the rep if informed no PA is required you should push back and  reference Medical Policy [phone] and ask for information on how to obtain pre-determination.  Mandatory PA Blurb:  Please ensure your patient meets medical necessity by reviewing Medical Policy [phone] at  [email or web address], and obtaining a pre-determination by contacting the PA dept at [phone].

## 2023-03-02 NOTE — Telephone Encounter (Signed)
LMTCB and sent mychart message

## 2023-03-04 ENCOUNTER — Telehealth: Payer: Self-pay | Admitting: Family

## 2023-03-04 NOTE — Telephone Encounter (Signed)
LVM to inform pt of the following  We rec'd DME request for Cipap materials   Previsouly she followed with pulmonology   Insurance requires office visit and notes   Please sch OV if she would like Cipap orders to come from me as new note will be needed.

## 2023-03-04 NOTE — Telephone Encounter (Signed)
Call pt We rec'd DME request for Cipap materials  Previsouly she followed with pulmonology  Insurance requires office visit and notes  Please sch OV if she would like Cipap orders to come from me as new note will be needed.

## 2023-03-07 NOTE — Telephone Encounter (Signed)
 Spoke to pt and she stated that she will contact her insurance and the [employer] that took over her C-pap supplies  and see why they sent orders to [place]  and not to Pulmonology, and she will contact us back to see what we need to do on our end if anything

## 2023-03-13 NOTE — Telephone Encounter (Signed)
 Medical [employer] [facility]  Prior Authorization for [employer] APPROVED PA# [id number] Valid: 03/13/23-03/12/24

## 2023-03-13 NOTE — Telephone Encounter (Signed)
 Patient is ready for scheduling on or after 03/14/23 BUY AND BILL  Out-of-pocket cost due at time of visit: $361.87  Primary: UHC AARP Medicare Adv HMO POS Prolia co-insurance: 20% (approximately $331.87) Admin fee co-insurance: $30  Deductible: does not apply  Prior Auth: APPROVED PA# [id number] Valid: 03/13/23-03/12/24  Secondary: N/A Prolia co-insurance:  Admin fee co-insurance:  Deductible:  Prior Auth:  PA# Valid:   ** This summary of benefits is an estimation of the patient's out-of-pocket cost. Exact cost may vary based on individual plan coverage.

## 2023-04-01 ENCOUNTER — Ambulatory Visit: Payer: Medicare Other

## 2023-04-08 ENCOUNTER — Ambulatory Visit

## 2023-04-08 VITALS — BP 120/80 | HR 58 | Ht 60.0 in | Wt 156.0 lb

## 2023-04-08 DIAGNOSIS — M810 Age-related osteoporosis without current pathological fracture: Secondary | ICD-10-CM

## 2023-04-08 MED ORDER — DENOSUMAB 60 MG/ML ~~LOC~~ SOSY
60.0000 mg | PREFILLED_SYRINGE | SUBCUTANEOUS | Status: AC
  Administered 2023-10-14: 60 mg via SUBCUTANEOUS

## 2023-04-08 NOTE — Progress Notes (Signed)
 After obtaining consent, and per orders of Dr. Lonzo Cloud, injection of Prolia 60mg  given by Oren Barella L Samanthamarie Ezzell in left arm SQ. Patient instructed to remain in clinic for 20 minutes afterwards, and to report any adverse reaction to me immediately.

## 2023-04-09 NOTE — Telephone Encounter (Signed)
 Last Prolia inj 04/08/23 Next Prolia inj due 10/10/23

## 2023-04-14 ENCOUNTER — Other Ambulatory Visit: Payer: Self-pay | Admitting: Family

## 2023-04-14 DIAGNOSIS — I10 Essential (primary) hypertension: Secondary | ICD-10-CM

## 2023-05-13 ENCOUNTER — Other Ambulatory Visit: Payer: Self-pay | Admitting: Family

## 2023-05-13 DIAGNOSIS — I10 Essential (primary) hypertension: Secondary | ICD-10-CM

## 2023-05-25 DIAGNOSIS — N1832 Chronic kidney disease, stage 3b: Secondary | ICD-10-CM | POA: Diagnosis not present

## 2023-05-25 DIAGNOSIS — N2581 Secondary hyperparathyroidism of renal origin: Secondary | ICD-10-CM | POA: Diagnosis not present

## 2023-05-25 DIAGNOSIS — R600 Localized edema: Secondary | ICD-10-CM | POA: Diagnosis not present

## 2023-05-25 DIAGNOSIS — I129 Hypertensive chronic kidney disease with stage 1 through stage 4 chronic kidney disease, or unspecified chronic kidney disease: Secondary | ICD-10-CM | POA: Diagnosis not present

## 2023-05-30 ENCOUNTER — Ambulatory Visit: Payer: Self-pay

## 2023-05-30 DIAGNOSIS — M25551 Pain in right hip: Secondary | ICD-10-CM

## 2023-05-30 NOTE — Telephone Encounter (Signed)
 Pt refused appt. Pt would just like to have a referral.

## 2023-05-30 NOTE — Telephone Encounter (Signed)
  Chief Complaint: Hip pain, back pain Symptoms: burning, dull, aching pain that worsens throughout day Frequency: years, but worsened lately Pertinent Negatives: Patient denies n/a Disposition: [] ED /[] Urgent Care (no appt availability in office) / [x] Appointment(In office/virtual)/ []  [place] [facility]/ [] Home Care/ [x] Refused Recommended Disposition /[] [place] [facility]/ [x]  Follow-up with PCP Additional Notes: Patient called in stating she has had bilateral hip pain for years, but it wasn't bad enough to report to PCP. Patient states recently it has gotten so bad that by the end of the day she cannot walk. Patient states she does have osteoporosis. Patient does not want to come into PCP, stating 'I know they cannot help me. I want to be referred to a specialist'. Please advise.    Copied from CRM [id number]. Topic: Clinical - Red Word Triage >> May 30, 2023 12:13 PM Christie Stevenson wrote: [facility] that prompted transfer to Nurse Triage: Patient called in stated she has lower back pain and hip pain, unable to walk by the end of the day. Would like a recommendation on a specialist that she can see for this Reason for Disposition  [1] MODERATE pain (e.g., interferes with normal activities, limping) AND [2] present > 3 days  Answer Assessment - Initial Assessment Questions 1. LOCATION and RADIATION: "Where is the pain located?"      Both hips and lower back across 2. QUALITY: "What does the pain feel like?"  (e.g., sharp, dull, aching, burning)     Burning, dull, and aching 3. SEVERITY: "How bad is the pain?" "What does it keep you from doing?"   (Scale 1-10; or mild, moderate, severe)   -  MILD (1-3): doesn't interfere with normal activities    -  MODERATE (4-7): interferes with normal activities (e.g., work or school) or awakens from sleep, limping    -  SEVERE (8-10): excruciating pain, unable to do any normal activities, unable to walk     Ranges 4. ONSET: "When did the pain start?"  "Does it come and go, or is it there all the time?"     Been going on for years but hasn't reported it, worsening lately 5. WORK OR EXERCISE: "Has there been any recent work or exercise that involved this part of the body?"      Patient states recently she may have exacerbated it in the shower 6. CAUSE: "What do you think is causing the hip pain?"      Patient has osteoporosis  7. AGGRAVATING FACTORS: "What makes the hip pain worse?" (e.g., walking, climbing stairs, running)     Better at rest 8. OTHER SYMPTOMS: "Do you have any other symptoms?" (e.g., back pain, pain shooting down leg,  fever, rash)     No, occasional swelling of left ankle  Protocols used: Hip Pain-A-AH

## 2023-05-31 NOTE — Addendum Note (Signed)
 Addended by: Calista Catching on: 05/31/2023 10:59 AM   Modules accepted: Orders

## 2023-05-31 NOTE — Telephone Encounter (Signed)
 Called Patient and gave her the [facility] information. Patient did not have any concerns at this time.

## 2023-05-31 NOTE — Telephone Encounter (Signed)
 LVM to call back to discuss message below. Please relay message when pt calls back   Call pt Referral to [facility] in place. She will get a phone call to schedule in the next 1-2 weeks   If she would prefer to just walk in to [facility] versus wait on referral     Walk in info below     [facility] [address]  Monday-Friday 8am-9pm Saturday and Sunday 9am- 9pm    [phone]

## 2023-05-31 NOTE — Telephone Encounter (Signed)
 Call pt Referral to [facility] in place. She will get a phone call to schedule in the next 1-2 weeks  If she would prefer to just walk in to [facility] versus wait on referral   Walk in info below   [facility] [address]  Monday-Friday 8am-9pm Saturday and Sunday 9am- 9pm   [phone]

## 2023-06-02 DIAGNOSIS — M5442 Lumbago with sciatica, left side: Secondary | ICD-10-CM | POA: Diagnosis not present

## 2023-06-02 DIAGNOSIS — M5441 Lumbago with sciatica, right side: Secondary | ICD-10-CM | POA: Diagnosis not present

## 2023-06-03 ENCOUNTER — Telehealth: Payer: Self-pay | Admitting: Family

## 2023-06-03 DIAGNOSIS — G4733 Obstructive sleep apnea (adult) (pediatric): Secondary | ICD-10-CM

## 2023-06-03 NOTE — Telephone Encounter (Signed)
 Call patient I received [facility] order for cpap.   Order equires face-to-face visit and updated sleep study.  Sleep studies are done through pulmonology.   I placed a referral to pulmonology for updated sleep study, management.  Please let patient know

## 2023-06-03 NOTE — Telephone Encounter (Signed)
 Spoke to pt she states that she will just wait for Pulmonology to call her to schedules sleep study

## 2023-06-10 ENCOUNTER — Telehealth: Payer: Self-pay

## 2023-06-10 NOTE — Telephone Encounter (Unsigned)
 Copied from CRM [id number]. Topic: Clinical - Prescription Issue >> Jun 10, 2023 12:38 PM Justina Oman C wrote: Reason for CRM: Patient [phone] states was advised from pcp FNP, Arnett cannot refill medical supplies: mask and filters for [facility] through [facility] until patient had another sleep study. Patient has an upcoming appointment with Dr. Auston Left 07/20/23. Patient states is out of medical supplies now and what is she to do. Please advise and call back.

## 2023-06-14 NOTE — Telephone Encounter (Signed)
 Per Dr. Auston Left, patient needs sooner appt. Rescheduled to 06/29/2023. NFN.

## 2023-06-29 ENCOUNTER — Encounter: Payer: Self-pay | Admitting: Internal Medicine

## 2023-06-29 ENCOUNTER — Ambulatory Visit: Admitting: Internal Medicine

## 2023-06-29 VITALS — BP 110/80 | HR 64 | Temp 98.4°F | Ht 60.0 in | Wt 152.4 lb

## 2023-06-29 DIAGNOSIS — G4733 Obstructive sleep apnea (adult) (pediatric): Secondary | ICD-10-CM

## 2023-06-29 NOTE — Patient Instructions (Signed)
 Excellent Job A+ GOLD STAR!!  Continue CPAP as prescribed  Patient Instructions Continue to use CPAP every night, minimum of 4-6 hours a night.  Change equipment every 30 days or as directed by DME.  Wash your tubing with warm soap and water daily, hang to dry. Wash humidifier portion weekly. Use bottled, distilled water and change daily   Be aware of reduced alertness and do not drive or operate heavy machinery if experiencing this or drowsiness.  Exercise encouraged, as tolerated. Encouraged proper weight management.  Important to get eight or more hours of sleep  Limiting the use of the computer and television before bedtime.  Decrease naps during the day, so night time sleep will become enhanced.  Limit caffeine, and sleep deprivation.    Avoid Allergens and Irritants Avoid secondhand smoke Avoid SICK contacts Recommend  Masking  when appropriate Recommend Keep up-to-date with vaccinations  Referral to [facility] for mask assessment

## 2023-06-29 NOTE — Progress Notes (Signed)
 Name: Christie Stevenson MRN: [id number] DOB: [birth year]    STUDIES:  No current CT scans or CXR's HST--> 2019 SEVERE OS AHI 30  Chief complaint Follow-up assessment for OSA  HPI Patient carries a diagnosis of severe sleep apnea since 2019 with AHI of 30 Patient doing well with therapy  Discussed sleep data and reviewed with patient.  Encouraged proper weight management.  Discussed driving precautions and its relationship with hypersomnolence.  Discussed sleep hygiene, and benefits of a fixed sleep waked time.  The importance of getting eight or more hours of sleep discussed with patient.  Discussed limiting the use of the computer and television before bedtime.  Decrease naps during the day, so night time sleep will become enhanced.  Limit caffeine, and sleep deprivation.   Patient uses and benefits from therapy Using CPAP nightly and with naps Pressure setting is comfortable and is sleeping well.  No exacerbation at this time No evidence of heart failure at this time No evidence or signs of infection at this time No respiratory distress No fevers, chills, nausea, vomiting, diarrhea No evidence of lower extremity edema No evidence hemoptysis   PAST MEDICAL HISTORY :   has a past medical history of 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ease, Carotid artery occlusion, Chronic kidney disease, Depression, GERD (gastroesophageal reflux disease), History of colon polyps, History of diverticulitis, History of kidney stones, Hyperlipidemia, Hypertension, Migraine, and Osteoporosis.  has a past surgical history that includes Cataract extraction (Left, 2014); Tubal ligation; Colonoscopy; Cataract extraction w/PHACO (Right, 04/01/2015); Colonoscopy with propofol  (N/A, 05/26/2015); and Colonoscopy (N/A, 10/26/2021). Prior to Admission medications   Medication Sig Start Date End Date Taking? Authorizing Provider  alendronate  (FOSAMAX ) 70 MG tablet Take 1 tablet (70 mg total) by  mouth every 7 (seven) days. Take with a full glass of water on an empty stomach. 08/19/17   Calista Catching, FNP  atorvastatin  (LIPITOR) 40 MG tablet TAKE 1 TABLET (20 MG TOTAL) BY MOUTH DAILY. 04/11/17   Calista Catching, FNP  Calcium  Carb-Cholecalciferol (CALCIUM  600 + D PO) Take 600 mg by mouth 2 (two) times daily.    [provider]  latanoprost (XALATAN) 0.005 % ophthalmic solution Place 1 drop into the right eye at bedtime.    [provider]  metoprolol  succinate (TOPROL -XL) 25 MG 24 hr tablet TAKE 1 TABLET BY MOUTH EVERY DAY 06/14/17   Calista Catching, FNP  omeprazole  (PRILOSEC) 20 MG capsule Take 20 mg by mouth daily.    [provider]  triamterene -hydrochlorothiazide (MAXZIDE-25) 37.5-25 MG tablet Take 1 tablet by mouth daily. 03/16/17   Calista Catching, FNP  triamterene -hydrochlorothiazide (MAXZIDE-25) 37.5-25 MG tablet TAKE 1 TABLET BY MOUTH EVERY DAY 06/14/17   Calista Catching, FNP   Allergies  Allergen Reactions   Fosamax  [Alendronate ]     Chest pain    FAMILY HISTORY:  family history includes Arrhythmia in her brother; Arthritis in her mother; Breast cancer in her sister; Cancer in her paternal uncle and sister; Diabetes in her daughter, daughter, and son; Heart attack (age of onset: 80) in her brother; Heart disease (age of onset: 22) in her brother; Hypertension in her mother; Osteoporosis in her brother, brother, mother, sister, and sister. SOCIAL HISTORY:  reports that she quit smoking about 28 years ago. Her smoking use included cigarettes. She started smoking about 68 years ago. She has a 40 pack-year smoking history. She has never used smokeless tobacco. She reports that she does not drink alcohol  and does not use drugs.   BP 110/80 (BP Location: Left Arm, Patient Position: Sitting, Cuff Size: Normal)   Pulse 64   Temp 98.4 F (36.9 C) (Oral)   Ht 5' (1.524 m)   Wt 152 lb 6.4 oz (69.1 kg)   SpO2 97%   BMI 29.76 kg/m       Review of Systems: Gen:  Denies  fever, sweats, chills weight loss  HEENT: Denies blurred vision, double vision, ear pain, eye pain, hearing loss, nose bleeds, sore throat Cardiac:  No dizziness, chest pain or heaviness, chest tightness,edema, No JVD Resp:   No cough, -sputum production, -shortness of breath,-wheezing, -hemoptysis,  Other:  All other systems negative   Physical Examination:   General Appearance: No distress  EYES PERRLA, EOM intact.   NECK Supple, No JVD Pulmonary: normal breath sounds, No wheezing.  CardiovascularNormal S1,S2.  No m/r/g.   Abdomen: Benign, Soft, non-tender. Neurology UE/LE 5/5 strength, no focal deficits Ext pulses intact, cap refill intact ALL OTHER ROS ARE NEGATIVE     ASSESSMENT / PLAN:  83 year old pleasant white female seen today for follow-up assessment for severe OSA  Assessment of OSA Previous AHI 30 Continue CPAP as prescribed  Excellent compliance report Reviewed compliance report in detail with patient Patient definitely benefits the use of CPAP therapy as prescribed Using CPAP nightly and with naps Pressure setting is comfortable and is sleeping well. CPAP prescription V AUTO 22/8 5cm h20 AHI reduced to 4.2  No evidence of acute heart failure at this time No respiratory distress No fevers, chills, nausea, vomiting, diarrhea No evidence hemoptysis  Patient Instructions Continue to use CPAP every night, minimum of 4-6 hours a night.  Change equipment every 30 days or as directed by DME.  Wash your tubing with warm soap and water daily, hang to dry. Wash humidifier portion weekly. Use bottled, distilled water and change daily   Be aware of reduced alertness and do not drive or operate heavy machinery if experiencing this or drowsiness.  Exercise encouraged, as tolerated. Encouraged proper weight management.  Important to get eight or more hours of sleep  Limiting the use of the computer and television before  bedtime.  Decrease naps during the day, so night time sleep will become enhanced.  Limit caffeine, and sleep deprivation.  HTN, stroke, uncontrolled diabetes and heart failure are potential risk factors.  Risk of untreated sleep apnea including cardiac arrhthymias, stroke, DM, pulm HTN.     MEDICATION ADJUSTMENTS/LABS AND TESTS ORDERED: Referral to [facility] for mask assessment Continue CPAP as prescribed  CURRENT MEDICATIONS REVIEWED AT LENGTH WITH PATIENT TODAY   Patient  satisfied with Plan of action and management. All questions answered   Follow up 6 months   I spent a total of 47 minutes reviewing chart data, face-to-face evaluation with the patient, counseling and coordination of care as detailed above.      Lady Pier, M.D.  [facility]  Medical Director [facility] Medical Director [facility] Cardio-Pulmonary Department

## 2023-07-04 DIAGNOSIS — G4733 Obstructive sleep apnea (adult) (pediatric): Secondary | ICD-10-CM | POA: Diagnosis not present

## 2023-07-06 ENCOUNTER — Ambulatory Visit (INDEPENDENT_AMBULATORY_CARE_PROVIDER_SITE_OTHER): Admitting: *Deleted

## 2023-07-06 VITALS — Ht 60.0 in | Wt 157.0 lb

## 2023-07-06 DIAGNOSIS — Z0000 Encounter for general adult medical examination without abnormal findings: Secondary | ICD-10-CM | POA: Diagnosis not present

## 2023-07-06 NOTE — Patient Instructions (Signed)
 Christie Stevenson , Thank you for taking time out of your busy schedule to complete your Annual Wellness Visit with me. I enjoyed our conversation and look forward to speaking with you again next year. I, as well as your care team,  appreciate your ongoing commitment to your health goals. Please review the following plan we discussed and let me know if I can assist you in the future. Your Game plan/ To Do List    Referrals: If you haven't heard from the office you've been referred to, please reach out to them at the phone provided.  Remember to update your covid vaccine Follow up Visits: Next Medicare AWV with our clinical staff: 07/09/24 @ 11:30   Have you seen your provider in the last 6 months (3 months if uncontrolled diabetes)? No Next Office Visit with your provider: 11/14/23  Clinician Recommendations:  Aim for 30 minutes of exercise or brisk walking, 6-8 glasses of water, and 5 servings of fruits and vegetables each day.       This is a list of the screening recommended for you and due dates:  Health Maintenance  Topic Date Due   COVID-19 Vaccine (6 - 2024-25 season) 12/19/2022   Flu Shot  08/19/2023   Colon Cancer Screening  10/27/2026   DTaP/Tdap/Td vaccine (3 - Td or Tdap) 01/17/2033   Pneumococcal Vaccine for age over 11  Completed   DEXA scan (bone density measurement)  Completed   Zoster (Shingles) Vaccine  Completed   HPV Vaccine  Aged Out   Meningitis B Vaccine  Aged Out    Advanced directives: (Copy Requested) Please bring a copy of your health care power of attorney and living will to the office to be added to your chart at your convenience. You can mail to [facility] [address] [place], [place] [address] or email to [email or web address] Advance Care Planning is important because it:  [x]  Makes sure you receive the medical care that is consistent with your values, goals, and preferences  [x]  It provides guidance to your family and loved ones  and reduces their decisional burden about whether or not they are making the right decisions based on your wishes.

## 2023-07-06 NOTE — Progress Notes (Signed)
 Subjective:   Christie Stevenson is a 83 y.o. who presents for a Medicare Wellness preventive visit.  As a reminder, Annual Wellness Visits don't include a physical exam, and some assessments may be limited, especially if this visit is performed virtually. We may recommend an in-person follow-up visit with your provider if needed.  Visit Complete: Virtual I connected with  Christie Stevenson on 07/06/23 by a audio enabled telemedicine application and verified that I am speaking with the correct person using two identifiers.  Patient Location: Home  Provider Location: Home Office  I discussed the limitations of evaluation and management by telemedicine. The patient expressed understanding and agreed to proceed.  Vital Signs: Because this visit was a virtual/telehealth visit, some criteria may be missing or patient reported. Any vitals not documented were not able to be obtained and vitals that have been documented are patient reported.  VideoDeclined- This patient declined Librarian, academic. Therefore the visit was completed with audio only.  Persons Participating in Visit: Patient.  AWV Questionnaire: No: Patient Medicare AWV questionnaire was not completed prior to this visit.  Cardiac Risk Factors include: advanced age (>32men, >71 women);dyslipidemia;hypertension;obesity (BMI >30kg/m2)     Objective:    Today's Vitals   07/06/23 1503  Weight: 157 lb (71.2 kg)  Height: 5' (1.524 m)   Body mass index is 30.66 kg/m.     07/06/2023    3:22 PM 10/26/2021   12:56 PM 09/08/2021   12:58 PM 09/05/2020    1:41 PM 09/05/2019    1:21 PM 02/22/2017    1:05 PM 02/20/2016    1:15 PM  Advanced Directives  Does Patient Have a Medical Advance Directive? Yes Yes Yes Yes Yes No  No   Type of Estate agent of [place];Living will Healthcare Power of [place];Living will Healthcare Power of [place];Living will Healthcare Power of [employer] of [place];Living will    Does patient want to make changes to medical advance directive?   No - Patient declined  No - Patient declined    Copy of Healthcare Power of Attorney in Chart? No - copy requested  No - copy requested No - copy requested No - copy requested    Would patient like information on creating a medical advance directive?  No - Patient declined    Yes (MAU/Ambulatory/Procedural Areas - Information given)  No - Patient declined      Data saved with a previous flowsheet row definition    Current Medications (verified) Outpatient Encounter Medications as of 07/06/2023  Medication Sig   atorvastatin  (LIPITOR) 40 MG tablet TAKE 1 TABLET BY MOUTH EVERY DAY   COMIRNATY syringe    diphenhydramine-acetaminophen (TYLENOL PM) 25-500 MG TABS tablet Take 1 tablet by mouth at bedtime as needed.   ketoconazole (NIZORAL) 2 % cream Apply 1 Application topically daily.   latanoprost (XALATAN) 0.005 % ophthalmic solution Place 1 drop into the right eye at bedtime.   metoprolol  succinate (TOPROL -XL) 25 MG 24 hr tablet TAKE 1 TABLET BY MOUTH EVERY DAY   mometasone (ELOCON) 0.1 % ointment Apply 1 Application topically 2 (two) times daily.   Multiple Vitamins-Minerals (MULTIPLE VITAMINS/WOMENS) tablet Take 1 tablet by mouth daily.   omeprazole  (PRILOSEC) 20 MG capsule Take 20 mg by mouth daily.   triamterene -hydrochlorothiazide (MAXZIDE-25) 37.5-25 MG tablet Take 1 tablet by mouth daily. (Patient taking differently: Take 1 tablet by mouth daily. Takes 1/2 daily)   BOOSTRIX 5-2.5-18.5 LF-MCG/0.5 injection  (Patient  not taking: Reported on 07/06/2023)   fluconazole (DIFLUCAN) 200 MG tablet  (Patient not taking: Reported on 07/06/2023)   FLUZONE HIGH-DOSE 0.5 ML injection  (Patient not taking: Reported on 07/06/2023)   Facility-Administered Encounter Medications as of 07/06/2023  Medication   [START ON 10/05/2023] denosumab  (PROLIA ) injection 60 mg    Allergies (verified) Fosamax   [alendronate ]   History: Past Medical History:  Diagnosis Date   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ease    Carotid artery occlusion    Chronic kidney disease    STAGE 3   Depression    GERD (gastroesophageal reflux disease)    History of colon polyps    History of diverticulitis    History of kidney stones    Hyperlipidemia    Hypertension    Migraine    Osteoporosis    Past Surgical History:  Procedure Laterality Date   CATARACT EXTRACTION Left 2014   CATARACT EXTRACTION W/PHACO Right 04/01/2015   Procedure: CATARACT EXTRACTION PHACO AND INTRAOCULAR LENS PLACEMENT (IOC);  Surgeon: Clair Crews, MD;  Location: [facility] ORS;  Service: Ophthalmology;  Laterality: Right;  US  00:47 AP% 20.8 CDE 9.76 fluid pack lot # [id number]   COLONOSCOPY     COLONOSCOPY N/A 10/26/2021   Procedure: COLONOSCOPY;  Surgeon: Quintin Buckle, DO;  Location[facility] ENDOSCOPY;  Service: Gastroenterology;  Laterality: N/A;   COLONOSCOPY WITH PROPOFOL  N/A 05/26/2015   Procedure: COLONOSCOPY WITH PROPOFOL ;  Surgeon: Cassie Click, MD;  Location: [facility] ENDOSCOPY;  Service: Endoscopy;  Laterality: N/A;   TUBAL LIGATION     Family History  Problem Relation Age of Onset   Arthritis Mother    Hypertension Mother    Osteoporosis Mother    Osteoporosis Sister    Cancer Sister        Breast Cancer    Osteoporosis Sister    Breast cancer Sister        mid 58's   Osteoporosis Brother    Heart disease Brother 24       CABG and stents   Osteoporosis Brother    Heart attack Brother 61   Arrhythmia Brother        s/p Chartered certified accountant   Diabetes Daughter    Diabetes Daughter    Diabetes Son    Cancer Paternal Uncle        Prostate Cancer   Social History   Socioeconomic History   Marital status: Divorced    Spouse name: Not on file   Number of children: 5   Years of education: 12   Highest education level: Not on file  Occupational History   Occupation: Accounting work    Comment:  Retired  Tobacco Use   Smoking status: Former    Current packs/day: 0.00    Average packs/day: 1 pack/day for 40.0 years (40.0 ttl pk-yrs)    Types: Cigarettes    Start date: 07/22/1954    Quit date: 07/22/1994    Years since quitting: 28.9   Smokeless tobacco: Never  Vaping Use   Vaping status: Never Used  Substance and Sexual Activity   Alcohol use: No    Comment: 1 glass of wine on holidays.   Drug use: No   Sexual activity: Never  Other Topics Concern   Not on file  Social History Narrative   Caffeine - 2 cups of coffee   Exercise - none regularly   Diet - tries to eat as healthy as possible but can do better  Does not have a Living Will         Social Drivers of Health   Financial Resource Strain: Low Risk  (07/06/2023)   Overall Financial Resource Strain (CARDIA)    Difficulty of Paying Living Expenses: Not hard at all  Food Insecurity: No Food Insecurity (07/06/2023)   Hunger Vital Sign    Worried About Running Out of Food in the Last Year: Never true    Ran Out of Food in the Last Year: Never true  Transportation Needs: No Transportation Needs (07/06/2023)   PRAPARE - Administrator, Civil Service (Medical): No    Lack of Transportation (Non-Medical): No  Physical Activity: Inactive (07/06/2023)   Exercise Vital Sign    Days of Exercise per Week: 0 days    Minutes of Exercise per Session: 0 min  Stress: Stress Concern Present (07/06/2023)   [employer] of Occupational Health - Occupational Stress Questionnaire    Feeling of Stress: To some extent  Social Connections: Moderately Isolated (07/06/2023)   Social Connection and Isolation Panel    Frequency of Communication with Friends and Family: More than three times a week    Frequency of Social Gatherings with Friends and Family: Twice a week    Attends Religious Services: Never    Database administrator or Organizations: Yes    Attends Banker Meetings: Never    Marital Status:  Divorced    Tobacco Counseling Counseling given: Not Answered    Clinical Intake:  Pre-visit preparation completed: Yes  Pain : No/denies pain     BMI - recorded: 30.66 Nutritional Status: BMI > 30  Obese Nutritional Risks: None Diabetes: No  Lab Results  Component Value Date   HGBA1C 5.8 09/15/2021   HGBA1C 5.7 09/29/2020   HGBA1C 5.6 04/04/2019     How often do you need to have someone help you when you read instructions, pamphlets, or other written materials from your doctor or pharmacy?: 1 - Never  Interpreter Needed?: No  Information entered by :: R. Rhythm Wigfall LPN   Activities of Daily Living     07/06/2023    3:05 PM  In your present state of health, do you have any difficulty performing the following activities:  Hearing? 1  Comment wears aids  Vision? 0  Comment glasses  Difficulty concentrating or making decisions? 1  Comment for years  Walking or climbing stairs? 0  Dressing or bathing? 0  Doing errands, shopping? 0  Preparing Food and eating ? N  Using the Toilet? N  In the past six months, have you accidently leaked urine? N  Do you have problems with loss of bowel control? N  Managing your Medications? N  Managing your Finances? N  Housekeeping or managing your Housekeeping? N    Patient Care Team: Calista Catching, FNP as PCP - General (Family Medicine)  I have updated your Care Teams any recent Medical Services you may have received from other providers in the past year.     Assessment:   This is a routine wellness examination for [facility].  Hearing/Vision screen Hearing Screening - Comments:: Wears aids Vision Screening - Comments:: glasses   Goals Addressed             This Visit's Progress    Patient Stated       Wants to lose some weight and exercise       Depression Screen     07/06/2023    3:14  PM 11/11/2022   10:38 AM 09/22/2021   10:07 AM 09/08/2021   12:53 PM 09/29/2020   11:44 AM 09/29/2020   11:09 AM 09/05/2020     1:30 PM  PHQ 2/9 Scores  PHQ - 2 Score 1 1 0 0 0 0 0  PHQ- 9 Score 1 3   0      Fall Risk     07/06/2023    3:07 PM 11/11/2022   10:36 AM 09/22/2021   10:06 AM 09/08/2021   12:53 PM 03/30/2021   11:07 AM  Fall Risk   Falls in the past year? 0 0 0 0 0  Number falls in past yr: 0 0 0 0 0  Injury with Fall? 0 0 0  0  Risk for fall due to : No Fall Risks No Fall Risks No Fall Risks  No Fall Risks  Follow up Falls evaluation completed;Falls prevention discussed Falls evaluation completed  Falls evaluation completed  Falls evaluation completed      Data saved with a previous flowsheet row definition    MEDICARE RISK AT HOME:  Medicare Risk at Home Any stairs in or around the home?: No If so, are there any without handrails?: No Home free of loose throw rugs in walkways, pet beds, electrical cords, etc?: Yes Adequate lighting in your home to reduce risk of falls?: Yes Life alert?: No Use of a cane, walker or w/c?: No Grab bars in the bathroom?: Yes Shower chair or bench in shower?: Yes Elevated toilet seat or a handicapped toilet?: Yes  TIMED UP AND GO:  Was the test performed?  No  Cognitive Function: 6CIT completed    02/19/2015   11:22 AM  MMSE - Mini Mental State Exam  Orientation to time 5   Orientation to Place 5   Registration 3   Attention/ Calculation 5   Recall 3   Language- name 2 objects 2   Language- repeat 1  Language- follow 3 step command 3   Language- read & follow direction 1   Write a sentence 1   Copy design 1   Total score 30      Data saved with a previous flowsheet row definition        07/06/2023    3:22 PM 09/08/2021    1:03 PM 09/05/2019    1:29 PM 02/22/2017    1:39 PM  6CIT Screen  What Year? 0 points  0 points 0 points  What month? 0 points  0 points 0 points  What time? 0 points   0 points  Count back from 20 0 points   0 points  Months in reverse 0 points 0 points 0 points 0 points  Repeat phrase 2 points  0 points 0 points  Total  Score 2 points   0 points    Immunizations Immunization History  Administered Date(s) Administered   Fluad Quad(high Dose 65+) 10/17/2019, 09/29/2020, 09/18/2021   Influenza Split 10/19/2013   Influenza, High Dose Seasonal PF 10/15/2016, 09/26/2017, 10/11/2018, 10/24/2022   Influenza-Unspecified 12/02/2012, 10/19/2015, 10/26/2016, 10/19/2018   PFIZER(Purple Top)SARS-COV-2 Vaccination 02/22/2019, 03/15/2019, 12/20/2019, 05/08/2020   Pfizer Covid-19 Vaccine Bivalent Booster 34yrs & up 10/24/2022   Pneumococcal Conjugate-13 12/02/2012   Pneumococcal Polysaccharide-23 03/17/2015   Tdap 01/18/2023, 01/18/2023   Zoster Recombinant(Shingrix) 02/02/2023, 02/02/2023, 04/18/2023, 04/18/2023    Screening Tests Health Maintenance  Topic Date Due   COVID-19 Vaccine (6 - 2024-25 season) 12/19/2022   INFLUENZA VACCINE  08/19/2023   Colonoscopy  10/27/2026   DTaP/Tdap/Td (3 - Td or Tdap) 01/17/2033   Pneumococcal Vaccine: 50+ Years  Completed   DEXA SCAN  Completed   Zoster Vaccines- Shingrix  Completed   HPV VACCINES  Aged Out   Meningococcal B Vaccine  Aged Out    Health Maintenance  Health Maintenance Due  Topic Date Due   COVID-19 Vaccine (6 - 2024-25 season) 12/19/2022   Health Maintenance Items Addressed: Discussed the need to update covid vaccine  Additional Screening:  Vision Screening: Recommended annual ophthalmology exams for early detection of glaucoma and other disorders of the eye.Up to date [facility] Would you like a referral to an eye doctor? No    Dental Screening: Recommended annual dental exams for proper oral hygiene  Community Resource Referral / Chronic Care Management: CRR required this visit?  No   CCM required this visit?  No   Plan:    I have personally reviewed and noted the following in the patient's chart:   Medical and social history Use of alcohol, tobacco or illicit drugs  Current medications and supplements including opioid  prescriptions. Patient is not currently taking opioid prescriptions. Functional ability and status Nutritional status Physical activity Advanced directives List of other physicians Hospitalizations, surgeries, and ER visits in previous 12 months Vitals Screenings to include cognitive, depression, and falls Referrals and appointments  In addition, I have reviewed and discussed with patient certain preventive protocols, quality metrics, and best practice recommendations. A written personalized care plan for preventive services as well as general preventive health recommendations were provided to patient.   Felicitas Horse, LPN   1/61/0960   After Visit Summary: (MyChart) Due to this being a telephonic visit, the after visit summary with patients personalized plan was offered to patient via MyChart   Notes: Nothing significant to report at this time.

## 2023-07-07 NOTE — Telephone Encounter (Signed)
 So I called [facility] they sent a cpap supply order to Dr. Bascom Bossier to sign back in April. Dr. Eber Goldsmith wanted the patient to be follow by Dr. Auston Left. Patient was seen 06/29/23 by Dr. Auston Left and he sent the cpap supply renewal order to [facility]. Since the patient has [facility] Medicare I did sent the 06/29/23 note to [place]. [place] stated she would send the supply order to Dr. Auston Left to sign

## 2023-07-07 NOTE — Telephone Encounter (Signed)
 Copied from CRM [id number]. Topic: General - Other >> Jul 07, 2023  1:25 PM Allyne Areola wrote: Reason for CRM: Ernestina Headland from [facility] is calling to follow up on a request for CPAP supplies that was sent to the office. Phones [phone] Fax [phone]

## 2023-07-20 ENCOUNTER — Ambulatory Visit: Admitting: Internal Medicine

## 2023-07-20 ENCOUNTER — Telehealth: Payer: Self-pay

## 2023-07-20 NOTE — Telephone Encounter (Signed)
 Spoke to pt she stated that [facility] is not providing CPAP supplies we do not need to send orders to them She stated that it was [facility] that is assisting her

## 2023-07-20 NOTE — Telephone Encounter (Signed)
 Copied from CRM [id number]. Topic: General - Other >> Jul 20, 2023  2:23 PM Franky GRADE wrote: Reason for CRM: [facility] is calling for patient cpap equipment as well as most recent sleep study results and office notes. Best call back number is [phone] and fax number [phone].

## 2023-08-22 ENCOUNTER — Other Ambulatory Visit: Payer: Self-pay | Admitting: Family

## 2023-08-22 DIAGNOSIS — I10 Essential (primary) hypertension: Secondary | ICD-10-CM

## 2023-08-29 ENCOUNTER — Ambulatory Visit: Payer: Medicare Other | Admitting: Internal Medicine

## 2023-09-03 NOTE — Telephone Encounter (Signed)
 Prolia VOB initiated via [email or web address]  Next Prolia inj DUE: 10/10/23

## 2023-10-03 DIAGNOSIS — H40053 Ocular hypertension, bilateral: Secondary | ICD-10-CM | POA: Diagnosis not present

## 2023-10-03 DIAGNOSIS — H43813 Vitreous degeneration, bilateral: Secondary | ICD-10-CM | POA: Diagnosis not present

## 2023-10-03 DIAGNOSIS — Z961 Presence of intraocular lens: Secondary | ICD-10-CM | POA: Diagnosis not present

## 2023-10-03 DIAGNOSIS — H35373 Puckering of macula, bilateral: Secondary | ICD-10-CM | POA: Diagnosis not present

## 2023-10-03 NOTE — Telephone Encounter (Signed)
 [facility] [employer] [facility] [employer] - Prior Authorization REQUIRED for [employer]  PA PROCESS DETAILS: Please ensure your patient meets medical necessity by reviewing Medical Policy  [phone] at [email or web address], and obtaining a pre-determination by contacting the PA dept at [phone].

## 2023-10-06 NOTE — Telephone Encounter (Signed)
 [facility] [employer] [facility] [employer]  Prior Authorization on file and valid PA# [id number] Valid: 03/13/23-03/12/24

## 2023-10-10 NOTE — Telephone Encounter (Signed)
 Due to recent policy changes, VOB has been re-submitted.

## 2023-10-11 ENCOUNTER — Ambulatory Visit

## 2023-10-13 ENCOUNTER — Other Ambulatory Visit: Payer: Self-pay | Admitting: Family

## 2023-10-13 DIAGNOSIS — I10 Essential (primary) hypertension: Secondary | ICD-10-CM

## 2023-10-13 NOTE — Telephone Encounter (Signed)
 [facility] [employer]  Patient is ready for scheduling on or after 10/10/23  Out-of-pocket cost due at time of visit: $361.87   Primary: [facility] AARP Medicare Advantage HMO-POS Prolia  co-insurance: 20% (approximately $331.87) Admin fee co-insurance: $30  Deductible: does not apply  Prior Auth: APPROVED PA# [id number] Valid: 03/13/23-03/12/24  Secondary: N/A Prolia  co-insurance:  Admin fee co-insurance:  Deductible:  Prior Auth:  PA# Valid:   ** This summary of benefits is an estimation of the patient's out-of-pocket cost. Exact cost may vary based on individual plan coverage.

## 2023-10-14 ENCOUNTER — Ambulatory Visit

## 2023-10-14 ENCOUNTER — Encounter: Payer: Self-pay | Admitting: Internal Medicine

## 2023-10-14 ENCOUNTER — Ambulatory Visit: Admitting: Internal Medicine

## 2023-10-14 VITALS — BP 112/70 | HR 71 | Ht 60.0 in | Wt 163.0 lb

## 2023-10-14 DIAGNOSIS — N2581 Secondary hyperparathyroidism of renal origin: Secondary | ICD-10-CM

## 2023-10-14 DIAGNOSIS — M810 Age-related osteoporosis without current pathological fracture: Secondary | ICD-10-CM | POA: Diagnosis not present

## 2023-10-14 DIAGNOSIS — E559 Vitamin D deficiency, unspecified: Secondary | ICD-10-CM | POA: Diagnosis not present

## 2023-10-14 NOTE — Progress Notes (Signed)
 Name: Christie Stevenson  MRN/ DOB: [id number], Apr 07, 1940    Age/ Sex: 83 y.o., female    PCP: Dineen Rollene MATSU, FNP   Reason for Endocrinology Evaluation: Osteoporosis      Date of Initial Endocrinology Evaluation: 02/28/2023    HPI: Christie Stevenson is a 83 y.o. female with a past medical history of HTN, OSA, GERD, osteoporosis. The patient presented for initial endocrinology clinic visit on 02/28/2023 for consultative assistance with her Osteoporosis .   Pt was diagnosed with osteoporosis: in 2017 with a T-score of -2.5 at the [facility]  Menopausal at age : late 65's  Fracture Hx: Right toes, right wrist and left ankle  Hx of HRT: yes- for ~15 yrs  FH of osteoporosis or hip fracture: Mother  Prior Hx of anti-resorptive therapy : Alendronate -chest pain was on it for ~ 8-10 years. She was approved for Prolia  in 2024 but it was cost prohibitive     Patient received first Prolia  injections through our clinic on 04/08/2023  SUBJECTIVE:    Today (10/14/23):  Christie Stevenson is here for follow-up on osteoporosis  Patient follows with pulmonary for OSA on CPAP She started on Prolia  with the last injection 04/08/2023 No rash to Prolia  injection   Follows with [facility] ( Dr. Saralee Stank)  No polydipsia  She drinks 16 oz of milk a day  No constipation or diarrhea  No recent falls  She does weight bearing exercise     Calcium /Vit D 600 mg , 1 tablet daily  Vitamin D3 1000 units daily MVI, 1 tab daily     HISTORY:  Past Medical History:  Past Medical History:  Diagnosis Date   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ease    Carotid artery occlusion    Chronic kidney disease    STAGE 3   Depression    GERD (gastroesophageal reflux disease)    History of colon polyps    History of diverticulitis    History of kidney stones    Hyperlipidemia    Hypertension    Migraine    Osteoporosis    Past Surgical History:  Past Surgical History:   Procedure Laterality Date   CATARACT EXTRACTION Left 2014   CATARACT EXTRACTION W/PHACO Right 04/01/2015   Procedure: CATARACT EXTRACTION PHACO AND INTRAOCULAR LENS PLACEMENT (IOC);  Surgeon: Elsie Carmine, MD;  Location: [facility] ORS;  Service: Ophthalmology;  Laterality: Right;  US  00:47 AP% 20.8 CDE 9.76 fluid pack lot # [id number]   COLONOSCOPY     COLONOSCOPY N/A 10/26/2021   Procedure: COLONOSCOPY;  Surgeon: Onita Elspeth Sharper, DO;  Location: [facility] ENDOSCOPY;  Service: Gastroenterology;  Laterality: N/A;   COLONOSCOPY WITH PROPOFOL  N/A 05/26/2015   Procedure: COLONOSCOPY WITH PROPOFOL ;  Surgeon: Lamar ONEIDA Holmes, MD;  Location: [facility] ENDOSCOPY;  Service: Endoscopy;  Laterality: N/A;   TUBAL LIGATION      Social History:  reports that she quit smoking about 29 years ago. Her smoking use included cigarettes. She started smoking about 69 years ago. She has a 40 pack-year smoking history. She has never used smokeless tobacco. She reports that she does not drink alcohol and does not use drugs. Family History: family history includes Arrhythmia in her brother; Arthritis in her mother; Breast cancer in her sister; Cancer in her paternal uncle and sister; Diabetes in her daughter, daughter, and son; Heart attack (age of onset: 35) in her brother; Heart disease (age of onset: 41) in her brother; Hypertension  in her mother; Osteoporosis in her brother, brother, mother, sister, and sister.   HOME MEDICATIONS: Allergies as of 10/14/2023       Reactions   Fosamax  [alendronate ]    Chest pain        Medication List        Accurate as of October 14, 2023 11:09 AM. If you have any questions, ask your nurse or doctor.          atorvastatin  40 MG tablet Commonly known as: LIPITOR TAKE 1 TABLET BY MOUTH EVERY DAY   Boostrix 5-2.5-18.5 LF-MCG/0.5 injection Generic drug: Tdap   Comirnaty syringe Generic drug: COVID-19 mRNA vaccine (Pfizer)   diphenhydramine-acetaminophen 25-500 MG  Tabs tablet Commonly known as: TYLENOL PM Take 1 tablet by mouth at bedtime as needed.   fluconazole 200 MG tablet Commonly known as: DIFLUCAN   Fluzone High-Dose 0.5 ML injection Generic drug: Influenza vac split trivalent PF   ketoconazole 2 % cream Commonly known as: NIZORAL Apply 1 Application topically daily.   latanoprost 0.005 % ophthalmic solution Commonly known as: XALATAN Place 1 drop into the right eye at bedtime.   metoprolol  succinate 25 MG 24 hr tablet Commonly known as: TOPROL -XL TAKE 1 TABLET BY MOUTH EVERY DAY   mometasone 0.1 % ointment Commonly known as: ELOCON Apply 1 Application topically 2 (two) times daily.   Multiple Vitamins/Womens tablet Take 1 tablet by mouth daily.   omeprazole  20 MG capsule Commonly known as: PRILOSEC Take 20 mg by mouth daily.   triamterene -hydrochlorothiazide 37.5-25 MG tablet Commonly known as: MAXZIDE-25 TAKE 1 TABLET BY MOUTH EVERY DAY          REVIEW OF SYSTEMS: A comprehensive ROS was conducted with the patient and is negative except as per HPI     OBJECTIVE:  VS: BP 112/70 (BP Location: Left Arm, Patient Position: Sitting, Cuff Size: Normal)   Pulse 71   Ht 5' (1.524 m)   Wt 163 lb (73.9 kg)   SpO2 99%   BMI 31.83 kg/m    Wt Readings from Last 3 Encounters:  10/14/23 163 lb (73.9 kg)  07/06/23 157 lb (71.2 kg)  06/29/23 152 lb 6.4 oz (69.1 kg)     EXAM: General: Pt appears well and is in NAD  Neck: General: Supple without adenopathy. Thyroid : Thyroid  size normal.  No goiter or nodules appreciated.  Lungs: Clear with good BS bilat   Heart: Auscultation: RRR.  Abdomen: Soft, nontender  Extremities:  BL LE: No pretibial edema   Mental Status: Judgment, insight: Intact Orientation: Oriented to time, place, and person Mood and affect: No depression, anxiety, or agitation     DATA REVIEWED:   Latest Reference Range & Units 10/14/23 11:31  Sodium 135 - 146 mmol/L 139  Potassium 3.5 - 5.3  mmol/L 4.1  Chloride 98 - 110 mmol/L 101  CO2 20 - 32 mmol/L 28  Glucose 65 - 99 mg/dL 94  BUN 7 - 25 mg/dL 26 (H)  Creatinine 9.39 - 0.95 mg/dL 8.74 (H)  Calcium  8.6 - 10.4 mg/dL 89.6  BUN/Creatinine Ratio 6 - 22 (calc) 21  eGFR > OR = 60 mL/min/1.67m2 43 (L)    Latest Reference Range & Units 10/14/23 11:31  Vitamin D , 25-Hydroxy 30 - 100 ng/mL 31  Glucose 65 - 99 mg/dL 94  PTH, Intact 16 - 77 pg/mL 99 (H)  Albumin MSPROF 3.6 - 5.1 g/dL 4.3  (H): Data is abnormally high        DXA  02/01/2023   DENSITOMETRY RESULTS: Site         Region     Measured Date Measured Age WHO Classification Young Adult T-score BMD         %Change vs. Previous Significant Change (*) DualFemur Neck Right 02/01/2023 81.5 Osteoporosis -2.9 0.634 g/cm2 -7.2% - DualFemur Neck Right 07/30/2019 78.0 Osteoporosis -2.6 0.683 g/cm2 -0.7% - DualFemur Neck Right 07/05/2017 75.9 Osteoporosis -2.5 0.688 g/cm2 - -   DualFemur Total Mean 02/01/2023 81.5 Osteopenia -2.4 0.705 g/cm2 -2.9% Yes DualFemur Total Mean 07/30/2019 78.0 Osteopenia -2.2 0.726 g/cm2 3.1% Yes DualFemur Total Mean 07/05/2017 75.9 Osteopenia -2.4 0.704 g/cm2 - -   Left Forearm Radius 33% 02/01/2023 81.5 Normal -0.9 0.797 g/cm2 -2.4% - Left Forearm Radius 33% 07/30/2019 78.0 Normal -0.7 0.817 g/cm2 -3.7% - Left Forearm Radius 33% 07/05/2017 75.9 Normal -0.3 0.848 g/cm2 - -   ASSESSMENT/PLAN/RECOMMENDATIONS:   Osteoporosis:  -Patient with history of multiple fractures -She was on alendronate  for approximately 8-10 years, developed chest pain. -Her PCP attempted to obtain Prolia  last year, but this was cost prohibitive -Emphasized the importance of optimizing calcium , vitamin D  intake and weightbearing exercises  Medications : Calcium  1200 mg daily Prolia  60 mg subcutaneous every 6 months  2.  Vitamin D  insufficiency  -Patient to add OTC vitamin D3 1000 IU daily  3.  Hypercalcemia  -PTH elevated, this is due to CKD   -Vitamin D  deficiency have resolved, hence improvement in PTH level -Unclear if she has true hypercalcemia at this time, but we will continue to monitor - Serum calcium  within normal range    Follow-up in 6 months  Signed electronically by: Stefano Redgie Butts, MD  [facility]  [facility] [address] [place], [place] [address] Phone: [phone] FAX: [phone]   CC: Dineen Rollene MATSU, FNP [address] [place] [address] Phone: [phone] Fax: [phone]   Return to Endocrinology clinic as below: Future Appointments  Date Time Provider Department Center  11/14/2023 10:00 AM Dineen Rollene MATSU, FNP [facility] [id number]  07/09/2024 11:30 AM [facility] ANNUAL WELLNESS VISIT [facility] [address]

## 2023-10-14 NOTE — Progress Notes (Signed)
After obtaining consent, and per orders of Dr. Shamleffer, injection of Prolia 60mg given by Lanson Randle L Takeem Krotzer left arm SQ. Patient instructed to remain in clinic for 20 minutes afterwards, and to report any adverse reaction to me immediately.  

## 2023-10-15 LAB — BASIC METABOLIC PANEL WITH GFR
BUN/Creatinine Ratio: 21 (calc) (ref 6–22)
BUN: 26 mg/dL — ABNORMAL HIGH (ref 7–25)
CO2: 28 mmol/L (ref 20–32)
Calcium: 10.3 mg/dL (ref 8.6–10.4)
Chloride: 101 mmol/L (ref 98–110)
Creat: 1.25 mg/dL — ABNORMAL HIGH (ref 0.60–0.95)
Glucose, Bld: 94 mg/dL (ref 65–99)
Potassium: 4.1 mmol/L (ref 3.5–5.3)
Sodium: 139 mmol/L (ref 135–146)
eGFR: 43 mL/min/1.73m2 — ABNORMAL LOW (ref >=60)

## 2023-10-15 LAB — PARATHYROID HORMONE, INTACT (NO CA): PTH: 99 pg/mL — ABNORMAL HIGH (ref 16–77)

## 2023-10-15 LAB — VITAMIN D 25 HYDROXY (VIT D DEFICIENCY, FRACTURES): Vit D, 25-Hydroxy: 31 ng/mL (ref 30–100)

## 2023-10-15 LAB — ALBUMIN: Albumin: 4.3 g/dL (ref 3.6–5.1)

## 2023-10-16 NOTE — Telephone Encounter (Signed)
 Last Prolia  inj 10/14/23 Next Prolia  inj due 04/13/24

## 2023-10-17 ENCOUNTER — Ambulatory Visit: Payer: Self-pay | Admitting: Internal Medicine

## 2023-11-14 ENCOUNTER — Ambulatory Visit: Admitting: Family

## 2023-11-14 ENCOUNTER — Encounter: Payer: Self-pay | Admitting: Family

## 2023-11-14 VITALS — BP 120/75 | HR 65 | Temp 97.5°F | Ht 60.0 in | Wt 164.0 lb

## 2023-11-14 DIAGNOSIS — N1832 Chronic kidney disease, stage 3b: Secondary | ICD-10-CM

## 2023-11-14 DIAGNOSIS — E785 Hyperlipidemia, unspecified: Secondary | ICD-10-CM

## 2023-11-14 DIAGNOSIS — Z23 Encounter for immunization: Secondary | ICD-10-CM | POA: Diagnosis not present

## 2023-11-14 DIAGNOSIS — Z8249 Family history of ischemic heart disease and other diseases of the circulatory system: Secondary | ICD-10-CM

## 2023-11-14 DIAGNOSIS — Z87891 Personal history of nicotine dependence: Secondary | ICD-10-CM | POA: Diagnosis not present

## 2023-11-14 DIAGNOSIS — Z0000 Encounter for general adult medical examination without abnormal findings: Secondary | ICD-10-CM | POA: Diagnosis not present

## 2023-11-14 DIAGNOSIS — N189 Chronic kidney disease, unspecified: Secondary | ICD-10-CM

## 2023-11-14 LAB — LIPID PANEL
Cholesterol: 162 mg/dL (ref 0–200)
HDL: 53.8 mg/dL (ref >39.00)
LDL Cholesterol: 83 mg/dL (ref 0–99)
NonHDL: 108.43
Total CHOL/HDL Ratio: 3
Triglycerides: 127 mg/dL (ref 0.0–149.0)
VLDL: 25.4 mg/dL (ref 0.0–40.0)

## 2023-11-14 LAB — CBC WITH DIFFERENTIAL/PLATELET
Basophils Absolute: 0.1 K/uL (ref 0.0–0.1)
Basophils Relative: 1 % (ref 0.0–3.0)
Eosinophils Absolute: 0.2 K/uL (ref 0.0–0.7)
Eosinophils Relative: 2.7 % (ref 0.0–5.0)
HCT: 39.5 % (ref 36.0–46.0)
Hemoglobin: 12.8 g/dL (ref 12.0–15.0)
Lymphocytes Relative: 24.5 % (ref 12.0–46.0)
Lymphs Abs: 1.4 K/uL (ref 0.7–4.0)
MCHC: 32.5 g/dL (ref 30.0–36.0)
MCV: 85.5 fl (ref 78.0–100.0)
Monocytes Absolute: 0.6 K/uL (ref 0.1–1.0)
Monocytes Relative: 10.1 % (ref 3.0–12.0)
Neutro Abs: 3.6 K/uL (ref 1.4–7.7)
Neutrophils Relative %: 61.7 % (ref 43.0–77.0)
Platelets: 261 K/uL (ref 150.0–400.0)
RBC: 4.62 Mil/uL (ref 3.87–5.11)
RDW: 15.2 % (ref 11.5–15.5)
WBC: 5.8 K/uL (ref 4.0–10.5)

## 2023-11-14 NOTE — Progress Notes (Unsigned)
 Assessment & Plan:  Routine general medical examination at a health care facility Assessment & Plan: Congratulated patient on diligence to exercise.  Due to family history, ordered AAA screen.  She is no longer screening for cervical cancer.  Mammogram is up to date.   Need for influenza vaccination -     Flu vaccine HIGH DOSE PF(Fluzone Trivalent)  Family history of abdominal aortic aneurysm (AAA) -     US  AORTA DUPLEX LIMITED; Future  Former smoker -     US  AORTA DUPLEX LIMITED; Future  Chronic kidney disease, unspecified CKD stage -     CBC with Differential/Platelet  Hyperlipidemia LDL goal <70 -     Lipid panel  Stage 3b chronic kidney disease (HCC)  CKD stage 3b, GFR 30-44 ml/min (HCC)     Return precautions given.   Risks, benefits, and alternatives of the medications and treatment plan prescribed today were discussed, and patient expressed understanding.   Education regarding symptom management and diagnosis given to patient on AVS either electronically or printed.  No follow-ups on file.  Rollene Northern, FNP  Subjective:    Patient ID: Christie Stevenson, female    DOB: 03/12/1940, 83 y.o.   MRN: [id number]  Christie Stevenson is a 83 y.o. female who presents today for physical exam.    HPI: HPI Discussed the use of AI scribe software for clinical note transcription with the patient, who gave verbal consent to proceed.  History of Present Illness   Christie Stevenson is an 83 year old female with osteoporosis here for CPE.   She has osteoporosis and is receiving Prolia  injections, with her second shot recently administered.  Compliant with calcium  and vitamin D .  She has a family history of aneurysm, as her mother reportedly died from an aneurysm in her back.   In terms of social history, she engages in activities such as reading and walking around [place] for physical activity. She has reduced her news consumption to manage stress and maintain  mental health.  Denies sob,  headaches or chest pain.      Following with Dr Sam for osteoporosis for prolia , hypercalemia  Following with Dr Dennise for CKD  Colorectal Cancer Screening: UTD , 10/2021 Dr Onita; repeat in 5 years time. Breast Cancer Screening: Mammogram UTD Cervical Cancer Screening: no longer screening  Bone Health screening/DEXA for 65+: DEXA 02/01/23  Lung Cancer Screening: Doesn't have 20 year pack year history and age > 6 years yo 80 years        Tetanus - UTD        Pneumococcal - Complete Exercise: Gets regular exercise.   Alcohol use:  1 glass of wine on holidays.  Smoking/tobacco use: former smoker.    Health Maintenance  Topic Date Due   COVID-19 Vaccine (8 - 2025-26 season) 11/30/2023*   Colon Cancer Screening  10/27/2026   DTaP/Tdap/Td vaccine (3 - Td or Tdap) 01/17/2033   Pneumococcal Vaccine for age over 65  Completed   Flu Shot  Completed   DEXA scan (bone density measurement)  Completed   Zoster (Shingles) Vaccine  Completed   Meningitis B Vaccine  Aged Out  *Topic was postponed. The date shown is not the original due date.    ALLERGIES: Fosamax  [alendronate ]  Current Outpatient Medications on File Prior to Visit  Medication Sig Dispense Refill   atorvastatin  (LIPITOR) 40 MG tablet TAKE 1 TABLET BY MOUTH EVERY DAY 90 tablet 1   BOOSTRIX  5-2.5-18.5 LF-MCG/0.5 injection      COMIRNATY syringe      diphenhydramine-acetaminophen (TYLENOL PM) 25-500 MG TABS tablet Take 1 tablet by mouth at bedtime as needed.     fluconazole (DIFLUCAN) 200 MG tablet      FLUZONE HIGH-DOSE 0.5 ML injection      ketoconazole (NIZORAL) 2 % cream Apply 1 Application topically daily.     latanoprost (XALATAN) 0.005 % ophthalmic solution Place 1 drop into the right eye at bedtime.     metoprolol  succinate (TOPROL -XL) 25 MG 24 hr tablet TAKE 1 TABLET BY MOUTH EVERY DAY 90 tablet 1   mometasone (ELOCON) 0.1 % ointment Apply 1 Application topically 2 (two) times  daily.     Multiple Vitamins-Minerals (MULTIPLE VITAMINS/WOMENS) tablet Take 1 tablet by mouth daily.     omeprazole  (PRILOSEC) 20 MG capsule Take 20 mg by mouth daily.     triamterene -hydrochlorothiazide (MAXZIDE-25) 37.5-25 MG tablet TAKE 1 TABLET BY MOUTH EVERY DAY 90 tablet 0   No current facility-administered medications on file prior to visit.    Review of Systems  Constitutional:  Negative for chills and fever.  Respiratory:  Negative for cough.   Cardiovascular:  Negative for chest pain and palpitations.  Gastrointestinal:  Negative for abdominal pain, nausea and vomiting.      Objective:    BP 120/75   Pulse 65   Temp (!) 97.5 F (36.4 C) (Oral)   Ht 5' (1.524 m)   Wt 164 lb (74.4 kg)   SpO2 98%   BMI 32.03 kg/m  10/14/23 112/70 BP Readings from Last 3 Encounters:  11/14/23 120/75  10/14/23 112/70  06/29/23 110/80   Wt Readings from Last 3 Encounters:  11/14/23 164 lb (74.4 kg)  10/14/23 163 lb (73.9 kg)  07/06/23 157 lb (71.2 kg)    Physical Exam Vitals reviewed.  Constitutional:      Appearance: Normal appearance. She is well-developed.  Eyes:     Conjunctiva/sclera: Conjunctivae normal.  Cardiovascular:     Rate and Rhythm: Normal rate and regular rhythm.     Pulses: Normal pulses.     Heart sounds: Normal heart sounds.  Pulmonary:     Effort: Pulmonary effort is normal.     Breath sounds: Normal breath sounds. No wheezing, rhonchi or rales.  Abdominal:     General: Bowel sounds are normal. There is no distension.     Palpations: Abdomen is soft. Abdomen is not rigid. There is no fluid wave, mass or pulsatile mass.     Tenderness: There is no abdominal tenderness. There is no guarding or rebound.  Skin:    General: Skin is warm and dry.  Neurological:     Mental Status: She is alert.  Psychiatric:        Speech: Speech normal.        Behavior: Behavior normal.        Thought Content: Thought content normal.

## 2023-11-14 NOTE — Patient Instructions (Signed)
 I ordered ultrasound of abdomen to screen for aneurysm.   Let us  know if you dont hear back within 2 weeks in regards to an appointment being scheduled.   So that you are aware, if you are [facility] MyChart user , please pay attention to your MyChart messages as you may receive a MyChart message with a phone number to call and schedule this test/appointment own your own from our referral coordinator. This is a new process so I do not want you to miss this message.  If you are not a MyChart user, you will receive a phone call.    Health Maintenance for Postmenopausal Women Menopause is a normal process in which your ability to get pregnant comes to an end. This process happens slowly over many months or years, usually between the ages of 34 and 62. Menopause is complete when you have missed your menstrual period for 12 months. It is important to talk with your health care provider about some of the most common conditions that affect women after menopause (postmenopausal women). These include heart disease, cancer, and bone loss (osteoporosis). Adopting a healthy lifestyle and getting preventive care can help to promote your health and wellness. The actions you take can also lower your chances of developing some of these common conditions. What are the signs and symptoms of menopause? During menopause, you may have the following symptoms: Hot flashes. These can be moderate or severe. Night sweats. Decrease in sex drive. Mood swings. Headaches. Tiredness (fatigue). Irritability. Memory problems. Problems falling asleep or staying asleep. Talk with your health care provider about treatment options for your symptoms. Do I need hormone replacement therapy? Hormone replacement therapy is effective in treating symptoms that are caused by menopause, such as hot flashes and night sweats. Hormone replacement carries certain risks, especially as you become older. If you are thinking about using estrogen or  estrogen with progestin, discuss the benefits and risks with your health care provider. How can I reduce my risk for heart disease and stroke? The risk of heart disease, heart attack, and stroke increases as you age. One of the causes may be a change in the body's hormones during menopause. This can affect how your body uses dietary fats, triglycerides, and cholesterol. Heart attack and stroke are medical emergencies. There are many things that you can do to help prevent heart disease and stroke. Watch your blood pressure High blood pressure causes heart disease and increases the risk of stroke. This is more likely to develop in people who have high blood pressure readings or are overweight. Have your blood pressure checked: Every 3-5 years if you are 51-53 years of age. Every year if you are 90 years old or older. Eat a healthy diet  Eat a diet that includes plenty of vegetables, fruits, low-fat dairy products, and lean protein. Do not eat a lot of foods that are high in solid fats, added sugars, or sodium. Get regular exercise Get regular exercise. This is one of the most important things you can do for your health. Most adults should: Try to exercise for at least 150 minutes each week. The exercise should increase your heart rate and make you sweat (moderate-intensity exercise). Try to do strengthening exercises at least twice each week. Do these in addition to the moderate-intensity exercise. Spend less time sitting. Even light physical activity can be beneficial. Other tips Work with your health care provider to achieve or maintain a healthy weight. Do not use any products that contain  nicotine or tobacco. These products include cigarettes, chewing tobacco, and vaping devices, such as e-cigarettes. If you need help quitting, ask your health care provider. Know your numbers. Ask your health care provider to check your cholesterol and your blood sugar (glucose). Continue to have your blood  tested as directed by your health care provider. Do I need screening for cancer? Depending on your health history and family history, you may need to have cancer screenings at different stages of your life. This may include screening for: Breast cancer. Cervical cancer. Lung cancer. Colorectal cancer. What is my risk for osteoporosis? After menopause, you may be at increased risk for osteoporosis. Osteoporosis is a condition in which bone destruction happens more quickly than new bone creation. To help prevent osteoporosis or the bone fractures that can happen because of osteoporosis, you may take the following actions: If you are 30-20 years old, get at least 1,000 mg of calcium  and at least 600 international units (IU) of vitamin D  per day. If you are older than age 73 but younger than age 42, get at least 1,200 mg of calcium  and at least 600 international units (IU) of vitamin D  per day. If you are older than age 28, get at least 1,200 mg of calcium  and at least 800 international units (IU) of vitamin D  per day. Smoking and drinking excessive alcohol increase the risk of osteoporosis. Eat foods that are rich in calcium  and vitamin D , and do weight-bearing exercises several times each week as directed by your health care provider. How does menopause affect my mental health? Depression may occur at any age, but it is more common as you become older. Common symptoms of depression include: Feeling depressed. Changes in sleep patterns. Changes in appetite or eating patterns. Feeling an overall lack of motivation or enjoyment of activities that you previously enjoyed. Frequent crying spells. Talk with your health care provider if you think that you are experiencing any of these symptoms. General instructions See your health care provider for regular wellness exams and vaccines. This may include: Scheduling regular health, dental, and eye exams. Getting and maintaining your vaccines. These  include: Influenza vaccine. Get this vaccine each year before the flu season begins. Pneumonia vaccine. Shingles vaccine. Tetanus, diphtheria, and pertussis (Tdap) booster vaccine. Your health care provider may also recommend other immunizations. Tell your health care provider if you have ever been abused or do not feel safe at home. Summary Menopause is a normal process in which your ability to get pregnant comes to an end. This condition causes hot flashes, night sweats, decreased interest in sex, mood swings, headaches, or lack of sleep. Treatment for this condition may include hormone replacement therapy. Take actions to keep yourself healthy, including exercising regularly, eating a healthy diet, watching your weight, and checking your blood pressure and blood sugar levels. Get screened for cancer and depression. Make sure that you are up to date with all your vaccines. This information is not intended to replace advice given to you by your health care provider. Make sure you discuss any questions you have with your health care provider. Document Revised: 05/26/2020 Document Reviewed: 05/26/2020 Elsevier Patient Education  2024 [employer].

## 2023-11-15 NOTE — Assessment & Plan Note (Signed)
 Congratulated patient on diligence to exercise.  Due to family history, ordered AAA screen.  She is no longer screening for cervical cancer.  Mammogram is up to date.

## 2023-11-23 ENCOUNTER — Other Ambulatory Visit: Payer: Self-pay | Admitting: Family

## 2023-11-23 ENCOUNTER — Ambulatory Visit
Admission: RE | Admit: 2023-11-23 | Discharge: 2023-11-23 | Disposition: A | Discharge status: Home / Self Care | Source: Ambulatory Visit | Attending: Family | Admitting: Family

## 2023-11-23 DIAGNOSIS — Z136 Encounter for screening for cardiovascular disorders: Secondary | ICD-10-CM | POA: Diagnosis present

## 2023-11-23 DIAGNOSIS — Z87891 Personal history of nicotine dependence: Secondary | ICD-10-CM | POA: Diagnosis present

## 2023-11-23 DIAGNOSIS — Z8249 Family history of ischemic heart disease and other diseases of the circulatory system: Secondary | ICD-10-CM | POA: Insufficient documentation

## 2023-11-23 DIAGNOSIS — I10 Essential (primary) hypertension: Secondary | ICD-10-CM

## 2023-11-24 ENCOUNTER — Ambulatory Visit: Payer: Self-pay | Admitting: Family

## 2024-02-07 ENCOUNTER — Other Ambulatory Visit: Payer: Self-pay | Admitting: Family

## 2024-02-07 DIAGNOSIS — Z1231 Encounter for screening mammogram for malignant neoplasm of breast: Secondary | ICD-10-CM

## 2024-02-09 ENCOUNTER — Encounter: Payer: Self-pay | Admitting: Internal Medicine

## 2024-02-09 ENCOUNTER — Ambulatory Visit: Admitting: Internal Medicine

## 2024-02-09 VITALS — BP 120/80 | HR 70 | Temp 98.7°F | Ht 59.0 in | Wt 170.6 lb

## 2024-02-09 DIAGNOSIS — I493 Ventricular premature depolarization: Secondary | ICD-10-CM

## 2024-02-09 DIAGNOSIS — G4733 Obstructive sleep apnea (adult) (pediatric): Secondary | ICD-10-CM | POA: Diagnosis not present

## 2024-02-09 DIAGNOSIS — R0602 Shortness of breath: Secondary | ICD-10-CM | POA: Diagnosis not present

## 2024-02-09 DIAGNOSIS — Z87891 Personal history of nicotine dependence: Secondary | ICD-10-CM

## 2024-02-09 NOTE — Patient Instructions (Addendum)
 Excellent Job A+ GOLD STAR!!  Continue CPAP as prescribed  Patient Instructions Continue to use CPAP every night, minimum of 4-6 hours a night.  Change equipment every 30 days or as directed by DME.  Wash your tubing with warm soap and water daily, hang to dry. Wash humidifier portion weekly. Use bottled, distilled water and change daily   Be aware of reduced alertness and do not drive or operate heavy machinery if experiencing this or drowsiness.  Exercise encouraged, as tolerated. Encouraged proper weight management.  Important to get eight or more hours of sleep  Limiting the use of the computer and television before bedtime.  Decrease naps during the day, so night time sleep will become enhanced.  Limit caffeine, and sleep deprivation.    Avoid Allergens and Irritants Avoid secondhand smoke Avoid SICK contacts Recommend  Masking  when appropriate Recommend Keep up-to-date with vaccinations  Plan to change mask to AirFit F 30i Referral to cardiology Check echocardiogram

## 2024-02-09 NOTE — Progress Notes (Signed)
 "  Name: Christie Stevenson MRN: [id number] DOB: Aug 28, 1940    STUDIES:  No current CT scans or CXR's HST--> 2019 SEVERE OS AHI 30  Chief complaint  follow-up assessment for severe OSA  HPI Dx of severe sleep apnea since 2019 with AHI of 30   Discussed sleep data and reviewed with patient.  Encouraged proper weight management.  Discussed sleep hygiene Patient uses and benefits from therapy Using CPAP nightly and with naps Settings are comfortable and is sleeping well. V AUTO 22/8 5cm h20 AHI reduced to 3.1  Bridge of the nose with ulceration closed wound Plan to change mask to AirFit F 30i  No exacerbation at this time No evidence of heart failure at this time No evidence or signs of infection at this time No respiratory distress No fevers, chills, nausea, vomiting, diarrhea No evidence of lower extremity edema No evidence hemoptysis  Patient with increased shortness of breath history of leaky valve had seen cardiology in the past patient noted to have arrhythmias At this time no significant chest pain or respiratory compromise will need echocardiogram and referral to cardiology for further assessment  PAST MEDICAL HISTORY :   has a past medical history of 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ease, Carotid artery occlusion, Chronic kidney disease, Depression, GERD (gastroesophageal reflux disease), History of colon polyps, History of diverticulitis, History of kidney stones, Hyperlipidemia, Hypertension, Migraine, and Osteoporosis.  has a past surgical history that includes Cataract extraction (Left, 2014); Tubal ligation; Colonoscopy; Cataract extraction w/PHACO (Right, 04/01/2015); Colonoscopy with propofol  (N/A, 05/26/2015); and Colonoscopy (N/A, 10/26/2021). Prior to Admission medications   Medication Sig Start Date End Date Taking? Authorizing Provider  alendronate  (FOSAMAX ) 70 MG tablet Take 1 tablet (70 mg total) by mouth every 7 (seven) days. Take with a full  glass of water on an empty stomach. 08/19/17   Dineen Rollene MATSU, FNP  atorvastatin  (LIPITOR) 40 MG tablet TAKE 1 TABLET (20 MG TOTAL) BY MOUTH DAILY. 04/11/17   Dineen Rollene MATSU, FNP  Calcium  Carb-Cholecalciferol (CALCIUM  600 + D PO) Take 600 mg by mouth 2 (two) times daily.    [provider]  latanoprost (XALATAN) 0.005 % ophthalmic solution Place 1 drop into the right eye at bedtime.    [provider]  metoprolol  succinate (TOPROL -XL) 25 MG 24 hr tablet TAKE 1 TABLET BY MOUTH EVERY DAY 06/14/17   Dineen Rollene MATSU, FNP  omeprazole  (PRILOSEC) 20 MG capsule Take 20 mg by mouth daily.    [provider]  triamterene -hydrochlorothiazide (MAXZIDE-25) 37.5-25 MG tablet Take 1 tablet by mouth daily. 03/16/17   Dineen Rollene MATSU, FNP  triamterene -hydrochlorothiazide (MAXZIDE-25) 37.5-25 MG tablet TAKE 1 TABLET BY MOUTH EVERY DAY 06/14/17   Dineen Rollene MATSU, FNP   Allergies  Allergen Reactions   Fosamax  [Alendronate ]     Chest pain    FAMILY HISTORY:  family history includes AAA (abdominal aortic aneurysm) in her mother; Arrhythmia in her brother; Arthritis in her mother; Breast cancer in her sister; Cancer in her paternal uncle and sister; Diabetes in her daughter, daughter, and son; Heart attack (age of onset: 38) in her brother; Heart disease (age of onset: 73) in her brother; Hypertension in her mother; Osteoporosis in her brother, brother, mother, sister, and sister. SOCIAL HISTORY:  reports that she quit smoking about 29 years ago. Her smoking use included cigarettes. She started smoking about 69 years ago. She has a 40 pack-year smoking history. She has never used smokeless tobacco. She reports that  she does not drink alcohol and does not use drugs.  BP 120/80   Pulse 70   Temp 98.7 F (37.1 C)   Ht 4' 11 (1.499 m)   Wt 170 lb 9.6 oz (77.4 kg)   SpO2 96%   BMI 34.46 kg/m    Physical Examination:  General Appearance: No distress  EYES EOM intact.    NECK Supple, No JVD Pulmonary: normal breath sounds, No wheezing.  CardiovascularNormal S1,S2.  No m/r/g.   Ext pulses intact, cap refill intact  ALL OTHER ROS ARE NEGATIVE      ASSESSMENT / PLAN:  84 year old pleasant white female seen today for follow-up assessment for severe OSA    Assessment of OSA Previous AHI 30 Continue CPAP as prescribed  Excellent compliance report Reviewed compliance report in detail with patient Patient definitely benefits the use of CPAP therapy as prescribed Using CPAP nightly and with naps Pressure setting is comfortable and is sleeping well. CPAP prescription V AUTO 22/8 5cm h20 AHI reduced to 3.1  No evidence of acute heart failure at this time No respiratory distress No fevers, chills, nausea, vomiting, diarrhea No evidence hemoptysis  Patient Instructions Continue to use CPAP every night, minimum of 4-6 hours a night.  Change equipment every 30 days or as directed by DME.  Wash your tubing with warm soap and water daily, hang to dry.  Wash humidifier portion weekly. Use bottled, distilled water and change daily  Risk of untreated sleep apnea including cardiac arrhthymias, stroke, DM, pulm HTN.   Shortness of breath history of PVCs Recommend echocardiogram referral back to cardiology No evidence of chest pain at this time   MEDICATION ADJUSTMENTS/LABS AND TESTS ORDERED: Continue therapy for sleep apnea Cardiology referral Echocardiogram   CURRENT MEDICATIONS REVIEWED AT LENGTH WITH PATIENT TODAY   Patient  satisfied with Plan of action and management. All questions answered   Follow up 6 months   I spent a total of 42 minutes dedicated to the care of this patient on the date of this encounter to include pre-visit review of records, face-to-face time with the patient discussing conditions above, post visit ordering of testing, clinical documentation with the electronic health record, making appropriate referrals as  documented, and communicating necessary information to the patient's healthcare team.    The Patient requires high complexity decision making for assessment and support, frequent evaluation and titration of therapies, application of advanced monitoring technologies and extensive interpretation of multiple databases.  Patient satisfied with Plan of action and management. All questions answered    Nickolas Alm Cellar, M.D.  [facility]  Medical Director [facility]     "

## 2024-02-16 ENCOUNTER — Telehealth: Payer: Self-pay

## 2024-02-16 NOTE — Addendum Note (Signed)
 Addended by: Annella Prowell J on: 02/16/2024 01:38 PM   Modules accepted: Orders

## 2024-02-16 NOTE — Telephone Encounter (Signed)
 This order was refaxed with correct information to [facility]

## 2024-02-16 NOTE — Telephone Encounter (Signed)
 Copied from CRM #[id number]. Topic: Clinical - Order For Equipment >> Feb 16, 2024  1:28 PM Ismael A wrote: Reason for CRM: pt was changed to AirFit F 30i mask, but she states it's not comfortable to use, she states she was able to get to sleep but she is experiencing pain in nose when she wakes up, would prefer to go back to using old head gear

## 2024-02-22 ENCOUNTER — Other Ambulatory Visit: Payer: Self-pay | Admitting: Family

## 2024-02-22 DIAGNOSIS — I10 Essential (primary) hypertension: Secondary | ICD-10-CM

## 2024-02-27 ENCOUNTER — Ambulatory Visit

## 2024-03-07 ENCOUNTER — Encounter

## 2024-04-13 ENCOUNTER — Ambulatory Visit

## 2024-07-09 ENCOUNTER — Ambulatory Visit

## 2024-10-16 ENCOUNTER — Ambulatory Visit: Admitting: Internal Medicine
